# Patient Record
Sex: Female | Born: 1950 | Race: White | Hispanic: No | Marital: Married | Smoking: Never smoker
Health system: Southern US, Community
[De-identification: ages and names within clinical notes are randomized; demographics above are authoritative.]

## PROBLEM LIST (undated history)

## (undated) DIAGNOSIS — H269 Unspecified cataract: Secondary | ICD-10-CM

## (undated) DIAGNOSIS — I712 Thoracic aortic aneurysm, without rupture, unspecified: Secondary | ICD-10-CM

## (undated) DIAGNOSIS — E785 Hyperlipidemia, unspecified: Secondary | ICD-10-CM

## (undated) DIAGNOSIS — T7840XA Allergy, unspecified, initial encounter: Secondary | ICD-10-CM

## (undated) DIAGNOSIS — E039 Hypothyroidism, unspecified: Secondary | ICD-10-CM

## (undated) DIAGNOSIS — F419 Anxiety disorder, unspecified: Secondary | ICD-10-CM

## (undated) DIAGNOSIS — Z9889 Other specified postprocedural states: Secondary | ICD-10-CM

## (undated) DIAGNOSIS — C801 Malignant (primary) neoplasm, unspecified: Secondary | ICD-10-CM

## (undated) DIAGNOSIS — M1990 Unspecified osteoarthritis, unspecified site: Secondary | ICD-10-CM

## (undated) DIAGNOSIS — D709 Neutropenia, unspecified: Secondary | ICD-10-CM

## (undated) DIAGNOSIS — R011 Cardiac murmur, unspecified: Secondary | ICD-10-CM

## (undated) DIAGNOSIS — K5792 Diverticulitis of intestine, part unspecified, without perforation or abscess without bleeding: Secondary | ICD-10-CM

## (undated) HISTORY — PX: TUBAL LIGATION: SHX77

## (undated) HISTORY — PX: KNEE ARTHROSCOPY: SHX127

## (undated) HISTORY — DX: Cardiac murmur, unspecified: R01.1

## (undated) HISTORY — PX: COLONOSCOPY: SHX174

## (undated) HISTORY — DX: Neutropenia, unspecified: D70.9

## (undated) HISTORY — DX: Thoracic aortic aneurysm, without rupture, unspecified: I71.20

## (undated) HISTORY — DX: Unspecified osteoarthritis, unspecified site: M19.90

## (undated) HISTORY — DX: Anxiety disorder, unspecified: F41.9

## (undated) HISTORY — PX: FOOT SURGERY: SHX648

## (undated) HISTORY — PX: LAPAROSCOPIC REPAIR AND REMOVAL OF GASTRIC BAND: SHX5919

## (undated) HISTORY — DX: Hypothyroidism, unspecified: E03.9

## (undated) HISTORY — DX: Diverticulitis of intestine, part unspecified, without perforation or abscess without bleeding: K57.92

## (undated) HISTORY — DX: Hyperlipidemia, unspecified: E78.5

## (undated) HISTORY — DX: Unspecified cataract: H26.9

## (undated) HISTORY — DX: Allergy, unspecified, initial encounter: T78.40XA

---

## 1997-12-08 ENCOUNTER — Other Ambulatory Visit: Admission: RE | Admit: 1997-12-08 | Discharge: 1997-12-08 | Payer: Self-pay | Admitting: Obstetrics and Gynecology

## 1998-03-23 ENCOUNTER — Other Ambulatory Visit: Admission: RE | Admit: 1998-03-23 | Discharge: 1998-03-23 | Payer: Self-pay | Admitting: Obstetrics and Gynecology

## 1998-07-13 ENCOUNTER — Other Ambulatory Visit: Admission: RE | Admit: 1998-07-13 | Discharge: 1998-07-13 | Payer: Self-pay | Admitting: Obstetrics and Gynecology

## 1998-07-19 ENCOUNTER — Ambulatory Visit (HOSPITAL_COMMUNITY): Admission: RE | Admit: 1998-07-19 | Discharge: 1998-07-19 | Payer: Self-pay | Admitting: Obstetrics and Gynecology

## 1998-07-19 ENCOUNTER — Encounter: Payer: Self-pay | Admitting: Obstetrics and Gynecology

## 1998-08-25 ENCOUNTER — Encounter (INDEPENDENT_AMBULATORY_CARE_PROVIDER_SITE_OTHER): Payer: Self-pay | Admitting: Specialist

## 1998-08-25 ENCOUNTER — Ambulatory Visit (HOSPITAL_COMMUNITY): Admission: RE | Admit: 1998-08-25 | Discharge: 1998-08-25 | Payer: Self-pay | Admitting: General Surgery

## 1999-01-10 ENCOUNTER — Other Ambulatory Visit: Admission: RE | Admit: 1999-01-10 | Discharge: 1999-01-10 | Payer: Self-pay | Admitting: *Deleted

## 1999-03-12 HISTORY — PX: UTERINE FIBROID SURGERY: SHX826

## 2000-01-18 ENCOUNTER — Other Ambulatory Visit: Admission: RE | Admit: 2000-01-18 | Discharge: 2000-01-18 | Payer: Self-pay | Admitting: Obstetrics and Gynecology

## 2001-01-22 ENCOUNTER — Other Ambulatory Visit: Admission: RE | Admit: 2001-01-22 | Discharge: 2001-01-22 | Payer: Self-pay | Admitting: Obstetrics and Gynecology

## 2002-03-11 DIAGNOSIS — K5792 Diverticulitis of intestine, part unspecified, without perforation or abscess without bleeding: Secondary | ICD-10-CM

## 2002-03-11 HISTORY — DX: Diverticulitis of intestine, part unspecified, without perforation or abscess without bleeding: K57.92

## 2002-03-11 HISTORY — PX: LAPAROSCOPIC GASTRIC BANDING: SHX1100

## 2002-07-15 ENCOUNTER — Other Ambulatory Visit: Admission: RE | Admit: 2002-07-15 | Discharge: 2002-07-15 | Payer: Self-pay | Admitting: Obstetrics and Gynecology

## 2004-07-10 ENCOUNTER — Emergency Department (HOSPITAL_COMMUNITY): Admission: EM | Admit: 2004-07-10 | Discharge: 2004-07-11 | Payer: Self-pay | Admitting: Emergency Medicine

## 2005-08-21 ENCOUNTER — Encounter (HOSPITAL_COMMUNITY): Admission: RE | Admit: 2005-08-21 | Discharge: 2005-11-19 | Payer: Self-pay | Admitting: Neurology

## 2007-10-12 ENCOUNTER — Ambulatory Visit: Payer: Self-pay | Admitting: Gastroenterology

## 2007-10-23 ENCOUNTER — Ambulatory Visit: Payer: Self-pay | Admitting: Gastroenterology

## 2007-10-23 ENCOUNTER — Encounter: Payer: Self-pay | Admitting: Gastroenterology

## 2007-10-27 ENCOUNTER — Encounter: Payer: Self-pay | Admitting: Gastroenterology

## 2007-12-18 ENCOUNTER — Other Ambulatory Visit: Admission: RE | Admit: 2007-12-18 | Discharge: 2007-12-18 | Payer: Self-pay | Admitting: Obstetrics & Gynecology

## 2010-07-27 NOTE — Op Note (Signed)
[name], Kimberly Bowers                 ACCOUNT NO.:  [id number]   MEDICAL RECORD NO.:  [id number]            PATIENT TYPE:   LOCATION:                                 FACILITY:   PHYSICIAN:  Catherine A. Orlin Hilding, M.D.  DATE OF BIRTH:   DATE OF PROCEDURE:  08/21/2005  DATE OF DISCHARGE:                                 OPERATIVE REPORT   OPERATION/PROCEDURE:  Tensilon testing.   INDICATIONS:  Right eye ptosis, rule out myasthenia gravis.  This is a 60-  year-old white woman with some right eye ptosis, being evaluated for  possible myasthenia with no other symptoms suggestive of that.  She had an  acetylcholine receptor antibody panel that was negative.  See attached H&P  for details.   DESCRIPTION OF PROCEDURE:  A saline lock was inserted into the patient's  right hand. Blood pressure and pulse were monitored periodically throughout  the test.  At baseline the patient had slight lid asymmetry with a few  millimeters of ptosis or pseudo ptosis on the right compared to the left.  A  test dose of 2 mg of Tensilon (0.2 mL) was administered without ill effect.  No change in pulse or blood pressure.  This was flushed with mL's saline.  The remaining 8 mg of were administered in 2-3 mg boluses and followed by  saline flushes.  She was following the entire 10 mg dose.  There was no  change in blood pressure or pulse.  No change in the ptosis versus pseudo  ptosis.  She was monitored an additional 30 minutes with pulse and blood  pressure to insure no bradycardia or hypotension occurred.  She tolerated  the procedure well and will be discharged home.  Outcome is a negative  Tensilon test.      Santina Evans A. Orlin Hilding, M.D.  Electronically Signed     CAW/MEDQ  D:  08/21/2005  T:  08/21/2005  Job:  [id number]   cc:   Allen Norris, M.D.  Fax: [phone]   Lovenia Kim, D.O.  Fax: [phone]

## 2010-12-20 DIAGNOSIS — E66811 Obesity, class 1: Secondary | ICD-10-CM | POA: Insufficient documentation

## 2011-02-21 DIAGNOSIS — I10 Essential (primary) hypertension: Secondary | ICD-10-CM | POA: Insufficient documentation

## 2011-03-23 DIAGNOSIS — Z9884 Bariatric surgery status: Secondary | ICD-10-CM | POA: Insufficient documentation

## 2011-12-09 ENCOUNTER — Other Ambulatory Visit: Payer: Self-pay | Admitting: Dermatology

## 2012-04-29 DIAGNOSIS — K224 Dyskinesia of esophagus: Secondary | ICD-10-CM | POA: Insufficient documentation

## 2012-08-14 ENCOUNTER — Other Ambulatory Visit: Payer: Self-pay | Admitting: Internal Medicine

## 2012-08-14 ENCOUNTER — Ambulatory Visit
Admission: RE | Admit: 2012-08-14 | Discharge: 2012-08-14 | Disposition: A | Discharge status: Home / Self Care | Payer: BC Managed Care – PPO | Source: Ambulatory Visit | Attending: Internal Medicine | Admitting: Internal Medicine

## 2012-08-14 DIAGNOSIS — R109 Unspecified abdominal pain: Secondary | ICD-10-CM

## 2012-08-14 DIAGNOSIS — R197 Diarrhea, unspecified: Secondary | ICD-10-CM

## 2012-08-14 MED ORDER — IOHEXOL 300 MG/ML  SOLN
100.0000 mL | Freq: Once | INTRAMUSCULAR | Status: AC | PRN
  Administered 2012-08-14: 100 mL via INTRAVENOUS

## 2012-10-01 DIAGNOSIS — E119 Type 2 diabetes mellitus without complications: Secondary | ICD-10-CM | POA: Insufficient documentation

## 2012-10-01 DIAGNOSIS — E039 Hypothyroidism, unspecified: Secondary | ICD-10-CM | POA: Insufficient documentation

## 2012-10-13 DIAGNOSIS — K9509 Other complications of gastric band procedure: Secondary | ICD-10-CM | POA: Insufficient documentation

## 2012-12-10 ENCOUNTER — Encounter: Payer: Self-pay | Admitting: Certified Nurse Midwife

## 2013-02-15 ENCOUNTER — Ambulatory Visit: Payer: Self-pay | Admitting: Emergency Medicine

## 2013-03-19 ENCOUNTER — Ambulatory Visit: Payer: Self-pay | Admitting: Certified Nurse Midwife

## 2013-03-21 ENCOUNTER — Encounter: Payer: Self-pay | Admitting: *Deleted

## 2013-03-21 ENCOUNTER — Other Ambulatory Visit: Payer: Self-pay | Admitting: Emergency Medicine

## 2013-03-21 DIAGNOSIS — F419 Anxiety disorder, unspecified: Secondary | ICD-10-CM | POA: Insufficient documentation

## 2013-03-21 DIAGNOSIS — D709 Neutropenia, unspecified: Secondary | ICD-10-CM | POA: Insufficient documentation

## 2013-03-21 DIAGNOSIS — T7840XA Allergy, unspecified, initial encounter: Secondary | ICD-10-CM | POA: Insufficient documentation

## 2013-03-21 DIAGNOSIS — E039 Hypothyroidism, unspecified: Secondary | ICD-10-CM | POA: Insufficient documentation

## 2013-03-21 MED ORDER — MOMETASONE FUROATE 50 MCG/ACT NA SUSP: 2.0000 | Freq: Every day | NASAL | Status: DC

## 2013-03-23 ENCOUNTER — Ambulatory Visit: Payer: Self-pay | Admitting: Certified Nurse Midwife

## 2013-03-25 ENCOUNTER — Ambulatory Visit (INDEPENDENT_AMBULATORY_CARE_PROVIDER_SITE_OTHER): Payer: BC Managed Care – PPO | Admitting: Emergency Medicine

## 2013-03-25 ENCOUNTER — Encounter: Payer: Self-pay | Admitting: Emergency Medicine

## 2013-03-25 VITALS — BP 126/98 | HR 76 | Temp 98.0°F | Resp 16 | Ht 65.5 in | Wt 226.0 lb

## 2013-03-25 DIAGNOSIS — E782 Mixed hyperlipidemia: Secondary | ICD-10-CM | POA: Insufficient documentation

## 2013-03-25 DIAGNOSIS — R635 Abnormal weight gain: Secondary | ICD-10-CM

## 2013-03-25 DIAGNOSIS — I10 Essential (primary) hypertension: Secondary | ICD-10-CM

## 2013-03-25 DIAGNOSIS — E039 Hypothyroidism, unspecified: Secondary | ICD-10-CM

## 2013-03-25 DIAGNOSIS — R7309 Other abnormal glucose: Secondary | ICD-10-CM

## 2013-03-25 LAB — CBC WITH DIFFERENTIAL/PLATELET
Basophils Absolute: 0 10*3/uL (ref 0.0–0.1)
Basophils Relative: 1 % (ref 0–1)
EOS PCT: 4 % (ref 0–5)
Eosinophils Absolute: 0.2 10*3/uL (ref 0.0–0.7)
HCT: 42.1 % (ref 36.0–46.0)
Hemoglobin: 14.4 g/dL (ref 12.0–15.0)
LYMPHS ABS: 1 10*3/uL (ref 0.7–4.0)
Lymphocytes Relative: 24 % (ref 12–46)
MCH: 32.4 pg (ref 26.0–34.0)
MCHC: 34.2 g/dL (ref 30.0–36.0)
MCV: 94.6 fL (ref 78.0–100.0)
MONO ABS: 0.3 10*3/uL (ref 0.1–1.0)
Monocytes Relative: 7 % (ref 3–12)
Neutro Abs: 2.6 10*3/uL (ref 1.7–7.7)
Neutrophils Relative %: 64 % (ref 43–77)
Platelets: 275 10*3/uL (ref 150–400)
RBC: 4.45 MIL/uL (ref 3.87–5.11)
RDW: 13 % (ref 11.5–15.5)
WBC: 4 10*3/uL (ref 4.0–10.5)

## 2013-03-25 LAB — LIPID PANEL
Cholesterol: 224 mg/dL — ABNORMAL HIGH (ref 0–200)
HDL: 82 mg/dL (ref >39)
LDL Cholesterol: 121 mg/dL — ABNORMAL HIGH (ref 0–99)
TRIGLYCERIDES: 103 mg/dL (ref <150)
Total CHOL/HDL Ratio: 2.7 Ratio
VLDL: 21 mg/dL (ref 0–40)

## 2013-03-25 LAB — TSH: TSH: 3.719 u[IU]/mL (ref 0.350–4.500)

## 2013-03-25 LAB — BASIC METABOLIC PANEL WITH GFR
BUN: 16 mg/dL (ref 6–23)
CALCIUM: 9.2 mg/dL (ref 8.4–10.5)
CO2: 29 mEq/L (ref 19–32)
CREATININE: 0.88 mg/dL (ref 0.50–1.10)
Chloride: 101 mEq/L (ref 96–112)
GFR, EST NON AFRICAN AMERICAN: 70 mL/min
GFR, Est African American: 80 mL/min
Glucose, Bld: 87 mg/dL (ref 70–99)
Potassium: 4.3 mEq/L (ref 3.5–5.3)
Sodium: 137 mEq/L (ref 135–145)

## 2013-03-25 LAB — HEPATIC FUNCTION PANEL
ALT: 12 U/L (ref 0–35)
AST: 21 U/L (ref 0–37)
Albumin: 4.2 g/dL (ref 3.5–5.2)
Alkaline Phosphatase: 59 U/L (ref 39–117)
BILIRUBIN DIRECT: 0.1 mg/dL (ref 0.0–0.3)
BILIRUBIN TOTAL: 0.6 mg/dL (ref 0.3–1.2)
Indirect Bilirubin: 0.5 mg/dL (ref 0.0–0.9)
Total Protein: 6.7 g/dL (ref 6.0–8.3)

## 2013-03-25 LAB — HEMOGLOBIN A1C
Hgb A1c MFr Bld: 4.9 % (ref <5.7)
Mean Plasma Glucose: 94 mg/dL (ref <117)

## 2013-03-25 LAB — VITAMIN B12: Vitamin B-12: 537 pg/mL (ref 211–911)

## 2013-03-25 NOTE — Patient Instructions (Signed)
Exercise to Lose Weight Exercise and a healthy diet may help you lose weight. Your doctor may suggest specific exercises. EXERCISE IDEAS AND TIPS  Choose low-cost things you enjoy doing, such as walking, bicycling, or exercising to workout videos.  Take stairs instead of the elevator.  Walk during your lunch break.  Park your car further away from work or school.  Go to a gym or an exercise class.  Start with 5 to 10 minutes of exercise each day. Build up to 30 minutes of exercise 4 to 6 days a week.  Wear shoes with good support and comfortable clothes.  Stretch before and after working out.  Work out until you breathe harder and your heart beats faster.  Drink extra water when you exercise.  Do not do so much that you hurt yourself, feel dizzy, or get very short of breath. Exercises that burn about 150 calories:  Running 1  miles in 15 minutes.  Playing volleyball for 45 to 60 minutes.  Washing and waxing a car for 45 to 60 minutes.  Playing touch football for 45 minutes.  Walking 1  miles in 35 minutes.  Pushing a stroller 1  miles in 30 minutes.  Playing basketball for 30 minutes.  Raking leaves for 30 minutes.  Bicycling 5 miles in 30 minutes.  Walking 2 miles in 30 minutes.  Dancing for 30 minutes.  Shoveling snow for 15 minutes.  Swimming laps for 20 minutes.  Walking up stairs for 15 minutes.  Bicycling 4 miles in 15 minutes.  Gardening for 30 to 45 minutes.  Jumping rope for 15 minutes.  Washing windows or floors for 45 to 60 minutes. Document Released: 03/30/2010 Document Revised: 05/20/2011 Document Reviewed: 03/30/2010 [facility] Patient Information 2014 [place], [place]. Gluten-Free Diet Gluten is a protein found in many grains. Gluten is present in wheat, rye, and barley. Gluten from wheat, rye, and barley protein interferes with the absorption of food in people with gluten sensitivity. It may also cause intestinal injury when eaten by  individuals with gluten sensitivity.  A sample piece (biopsy) of the small intestine is usually required for a positive diagnosis of gluten sensitivity. Dietary treatment consists of eliminating foods and food ingredients from wheat, rye, and barley. When these are taken out of the diet completely, most people regain function of the small intestine. Strict compliance is important even during symptom-free periods. People with gluten sensitivity need to be on a gluten-free diet for a lifetime. During the first stages of treatment, some people will also need to restrict dairy products that contain lactose, which is a naturally occurring sugar. Lactose is difficult to absorb when the small intestines are damaged (lactose intolerance).  WHO NEEDS THIS DIET Some people who have certain diseases need to be on a gluten-free diet. These diseases include:  Celiac disease.  Nontropical sprue.  Gluten-sensitive enteropathy.  Dermatitis herpetiformis. SPECIAL NOTES  Read all labels because gluten may have been added as an incidental ingredient. Words to check for on the label include: flour, starch, durum flour, graham flour, phosphated flour, self-rising flour, semolina, farina, modified food starch, cereal, thickening, fillers, emulsifiers, any kind of malt flavoring, and hydrolyzed vegetable protein. A registered dietician can help you identify possible harmful ingredients in the foods you normally eat.  If you are not sure whether an ingredient contains gluten, check with the manufacturer. Note that some manufacturers may change ingredients without notice. Always read labels.  Since flour and cereal products are often used in the  preparation of foods, it is important to be aware of the methods of preparation used, as well as the foods themselves. This is especially true when you are dining out. Starches  Allowed: Only those prepared from arrowroot, corn, potato, rice, and bean flours. Rice wafers(*),  pure cornmeal tortillas, popcorn, some crackers, and chips(*). Hot cereals made from cornmeal. Ask your dietician which specific hot and cold cereals are allowed. White or sweet potatoes, yams, hominy, rice or wild rice, and special gluten-free pasta. Some oriental rice noodles or bean noodles.  Avoid: All wheat and rye cereals, wheat germ, barley, bran, graham, malt, bulgur, and millet(-). NOTE: Avoid cereals containing malt as a flavoring, such as rice cereal. Regular noodles, spaghetti, macaroni, and most packaged rice mixes(*). All others containing wheat, rye, or barley. Vegetables  Allowed: All plain, fresh, frozen, or canned vegetables.  Avoid: Creamed vegetables(*) and vegetables canned in sauces(*). Any prepared with wheat, rye, or barley. Fruit  Allowed: All fresh, frozen, canned, or dried fruits. Fruit juices.  Avoid: Thickened or prepared fruits and some pie fillings(*). Meat and Meat Substitutes  Allowed: Meat, fish, poultry, or eggs prepared without added wheat, rye, or barley. Luncheon meat(*), frankfurters(*), and pure meat. All aged cheese and processed cheese products(*). Cottage cheese(+) and cream cheese(+). Dried beans, dried peas, and lentils.  Avoid: Any meat or meat alternate containing wheat, rye, barley, or gluten stabilizers. Bread-containing products, such as Swiss steak, croquettes, and meatloaf. Tuna canned in vegetable broth(*); [place] with HVP injected as part of the basting; any cheese product containing oat gum as an ingredient. Milk  Allowed: Milk. Yogurt made with allowed ingredients(*).  Avoid: Commercial chocolate milk which may have cereal added(*). Malted milk. Soups and Combination Foods  Allowed: Homemade broth and soups made with allowed ingredients; some canned or frozen soups are allowed(*). Combination or prepared foods that do not contain gluten(*). Read labels.  Avoid: All soups containing wheat, rye, or barley flour. Bouillon and bouillon  cubes that contain hydrolyzed vegetable protein (HVP). Combination or prepared foods that contain gluten(*). Desserts  Allowed:  Custard, some pudding mixes(*), homemade puddings from cornstarch, rice, and tapioca. Gelatin desserts, ices, and sherbet(*). Cake, cookies, and other desserts prepared with allowed flours. Some commercial ice creams(*). Ask your dietician about specific brands of dessert that are allowed.  Avoid: Cakes, cookies, doughnuts, and pastries that are prepared with wheat, rye, or barley flour. Some commercial ice creams(*), ice cream flavors which contain cookies, crumbs, or cheesecake(*). Ice cream cones. All commercially prepared mixes for cakes, cookies, and other desserts(*). Bread pudding and other puddings thickened with flour. Sweets  Allowed: Sugar, honey, syrup(*), molasses, jelly, jam, plain hard candy, marshmallows, gumdrops, homemade candies free from wheat, rye, or barley. Coconut.  Avoid: Commercial candies containing wheat, rye, or barley(*). Certain buttercrunch toffees are dusted with wheat flour. Ask your dietician about specific brands that are not allowed. Chocolate-coated nuts, which are often rolled in flour. Fats and Oils  Allowed: Butter, margarine, vegetable oil, sour cream(+), whipping cream, shortening, lard, cream, mayonnaise(*). Some commercial salad dressings(*). Peanut butter.  Avoid: Some commercial salad dressings(*). Beverages  Allowed: Coffee (regular or decaffeinated), tea, herbal tea (read label to be sure that no wheat flour has been added). Carbonated beverages and some root beers(*). Wine, sake, and distilled spirits, such as gin, vodka, and whiskey.  Avoid:  Certain cereal beverages. Ask your dietician about specific brands that are not allowed. Beer (unless gluten-free), ale, malted milk, and some root beers, wine,  and sake. Condiments/ Miscellaneous  Allowed: Salt, pepper, herbs, spices, extracts, and food colorings. Monosodium  glutamate (MSG). Cider, rice, and wine vinegar. Baking soda and baking powder. Certain soy sauces. Ask your dietician about specific brands that are allowed. Nuts, coconut, chocolate, and pure cocoa powder.  Avoid: Some curry powder(*), some dry seasoning mixes(*), some gravy extracts(*), some meat sauces(*), some catsup(*), some prepared mustard(*), horseradish(*), some soy sauce(*), chip dips(*), and some chewing gum(*). Yeast extract (contains barley). Caramel color (may contain malt). Ask your dietician about specific brands of condiments to avoid. Flour and Thickening Agents  Allowed: Arrowroot starch (A); Corn bran (B); Corn flour (B,C,D); Corn germ (B); Cornmeal (B,C,D); Corn starch (A); Potato flour (B,C,E); Potato starch flour (B,C,E); Rice bran (B); Rice flours: Plain, brown (B,C,D,E), and Sweet (A,B,C,F). Rice polish (B,C,G); Soy flour (B,C,G); Tapioca starch (A). The flour and thickening agents described above are good for: (A) Good thickening agent (B) Good when combined with other flours (C) Best combined with milk and eggs in baked products (D) Best in grainy-textured products (E) Produces drier product than other flours (F) Produces moister product than other flours (G) Adds distinct flavor to product. Use in moderation. (*) Check labels and investigate any questionable ingredients.  (-) Additional research is needed before this product can be recommended. (+) Check vegetable gum used. SAMPLE MEAL PLAN Breakfast   Orange juice.  Banana.  Rice or corn cereal.  Toast (gluten-free bread).  Heart-healthy tub margarine.  Jam.  Milk.  Coffee or tea. Lunch  Chicken salad sandwich (with gluten-free bread and mayonnaise).  Sliced tomatoes.  Heart-healthy tub margarine.  Apple.  Milk.  Coffee or tea. Dinner  [employer].  Baked potato.  Broccoli.  Lettuce salad with gluten-free dressing.  Gluten-free bread.  Custard.  Heart-healthy  margarine.  Coffee or tea. These meal plans are provided as samples. Your daily meal plans will vary. Document Released: 02/25/2005 Document Revised: 08/27/2011 Document Reviewed: 04/07/2011 [facility] Patient Information 2014 [place].

## 2013-03-25 NOTE — Progress Notes (Signed)
Subjective:    Patient ID: Kimberly Bowers, female    DOB: 1951/01/26, 63 y.o.   MRN: [id number]  HPI Comments: 63 yo female presents for 3 month F/U for labile HTN, Cholesterol, Pre-Dm, D. deficient She recently had lap band removed because of complications. She notes she is gaining weight. She is not exercising routinely. She is concerned that TSH is out of range. LAST LABS T 196 TG 71 H 74 L 108 INSULIN 14 D 61 A1C 5.1 She is trying to improve her diet. She is not checking BP routinely.   Hyperlipidemia   Current Outpatient Prescriptions on File Prior to Visit  Medication Sig Dispense Refill  . Calcium Carbonate-Vit D-Min (CALCIUM 600 + MINERALS PO) Take by mouth daily.      . Cholecalciferol (VITAMIN D) 2000 UNITS tablet Take 2,000 Units by mouth daily.      . fexofenadine (ALLEGRA) 60 MG tablet Take 60 mg by mouth daily as needed for allergies or rhinitis.      Marland Kitchen levothyroxine (SYNTHROID, LEVOTHROID) 100 MCG tablet Take 100 mcg by mouth daily before breakfast.      . metFORMIN (GLUCOPHAGE) 500 MG tablet Take by mouth 2 (two) times daily with a meal.      . mometasone (NASONEX) 50 MCG/ACT nasal spray Place 2 sprays into the nose daily.  17 g  2  . Wheat Dextrin (BENEFIBER) POWD Take by mouth 2 (two) times daily.      Marland Kitchen ALPRAZolam (XANAX) 0.25 MG tablet Take 0.25 mg by mouth as needed for anxiety.       No current facility-administered medications on file prior to visit.    Review of patient's allergies indicates no known allergies.  Past Medical History  Diagnosis Date  . Anxiety   . Hypothyroid   . Neutropenia   . Allergy       Review of Systems  Constitutional: Positive for unexpected weight change.  All other systems reviewed and are negative.   BP 126/98  Pulse 76  Temp(Src) 98 F (36.7 C) (Temporal)  Resp 16  Ht 5' 5.5" (1.664 m)  Wt 226 lb (102.513 kg)  BMI 37.02 kg/m2      Objective:   Physical Exam  Nursing note and vitals reviewed. Constitutional: She  is oriented to person, place, and time. She appears well-developed and well-nourished. No distress.  obese  HENT:  Head: Normocephalic and atraumatic.  Right Ear: External ear normal.  Left Ear: External ear normal.  Nose: Nose normal.  Mouth/Throat: Oropharynx is clear and moist.  Eyes: Conjunctivae and EOM are normal.  Neck: Normal range of motion. Neck supple. No JVD present. No thyromegaly present.  Cardiovascular: Normal rate, regular rhythm, normal heart sounds and intact distal pulses.   Pulmonary/Chest: Effort normal and breath sounds normal.  Abdominal: Soft. Bowel sounds are normal. She exhibits no distension and no mass. There is no tenderness. There is no rebound and no guarding.  Musculoskeletal: Normal range of motion. She exhibits no edema and no tenderness.  Lymphadenopathy:    She has no cervical adenopathy.  Neurological: She is alert and oriented to person, place, and time. No cranial nerve deficit.  Skin: Skin is warm and dry. No rash noted. No erythema. No pallor.  Psychiatric: She has a normal mood and affect. Her behavior is normal. Judgment and thought content normal.          Assessment & Plan:  1.  3 month F/U for labile HTN, diet/ herbal  controlled Cholesterol, Pre-Dm, D. Deficient. Needs healthy diet, cardio QD and obtain healthy weight. Check Labs, Check BP if >130/80 call office 2. Hypothyroid/ WT gain/ obese- Check labs, needs cardio QD and better diet discussed

## 2013-03-26 LAB — INSULIN, FASTING: Insulin fasting, serum: 11 u[IU]/mL (ref 3–28)

## 2013-04-08 ENCOUNTER — Encounter: Payer: Self-pay | Admitting: Certified Nurse Midwife

## 2013-04-14 ENCOUNTER — Ambulatory Visit: Payer: Self-pay | Admitting: Certified Nurse Midwife

## 2013-06-10 ENCOUNTER — Other Ambulatory Visit: Payer: Self-pay | Admitting: Emergency Medicine

## 2013-06-10 ENCOUNTER — Telehealth: Payer: Self-pay | Admitting: *Deleted

## 2013-06-10 MED ORDER — LEVOTHYROXINE SODIUM 100 MCG PO TABS: ORAL_TABLET | ORAL | Status: DC

## 2013-06-10 NOTE — Telephone Encounter (Signed)
Synthroid 126mcg takes 1 qd except on M W F takes 1 & 1/2   [facility]  [place]

## 2013-07-22 ENCOUNTER — Ambulatory Visit (INDEPENDENT_AMBULATORY_CARE_PROVIDER_SITE_OTHER): Payer: BC Managed Care – PPO | Admitting: Certified Nurse Midwife

## 2013-07-22 ENCOUNTER — Encounter: Payer: Self-pay | Admitting: Certified Nurse Midwife

## 2013-07-22 VITALS — BP 104/68 | HR 72 | Resp 16 | Ht 65.75 in | Wt 235.0 lb

## 2013-07-22 DIAGNOSIS — Z01419 Encounter for gynecological examination (general) (routine) without abnormal findings: Secondary | ICD-10-CM

## 2013-07-22 DIAGNOSIS — Z124 Encounter for screening for malignant neoplasm of cervix: Secondary | ICD-10-CM

## 2013-07-22 DIAGNOSIS — Z0000 Encounter for general adult medical examination without abnormal findings: Secondary | ICD-10-CM

## 2013-07-22 NOTE — Patient Instructions (Signed)

## 2013-07-22 NOTE — Progress Notes (Signed)
63 y.o. G2P2002 Married Caucasian Fe here for annual exam. Menopausal no HRT. Denies vaginal bleeding or vaginal dryness. Sees PCP for thyroid management, change in medication recently, has follow up scheduled. Sees PCP for aex and labs. Patient had lap removal due difficulty swallowing and has gained 30 pounds back. Has joined Weight watchers now with spouse and "is going to get this weight off again". No other health issues today.  Patient's last menstrual period was 03/11/2000.          Sexually active: yes  The current method of family planning is tubal ligation.    Exercising: yes  walking & free weights Smoker:  no  Health Maintenance: Pap:  02-26-12 neg HPV HR neg MMG:  04-22-13 normal Colonoscopy:  2009 10 year follow up BMD:   2015 TDaP:  07/08/12 Labs: none Self breast exam: done monthly   reports that she has never smoked. She does not have any smokeless tobacco history on file. She reports that she drinks about 1.2 - 1.8 ounces of alcohol per week. She reports that she does not use illicit drugs.  Past Medical History  Diagnosis Date  . Anxiety   . Hypothyroid   . Neutropenia   . Allergy   . Hypertension   . Diverticulitis 2004    Past Surgical History  Procedure Laterality Date  . Laparoscopic gastric banding  2004  . Knee arthroscopy Left   . Uterine fibroid surgery  2001  . Foot surgery Right     benign cyst  . Tubal ligation      age 4  . Laparoscopic repair and removal of gastric band      8/14    Current Outpatient Prescriptions  Medication Sig Dispense Refill  . Calcium Carbonate-Vit D-Min (CALCIUM 600 + MINERALS PO) Take by mouth daily.      . Cholecalciferol (VITAMIN D) 2000 UNITS tablet Take 2,000 Units by mouth daily.      . fexofenadine (ALLEGRA) 60 MG tablet Take 60 mg by mouth daily as needed for allergies or rhinitis.      Marland Kitchen levothyroxine (SYNTHROID, LEVOTHROID) 100 MCG tablet Take 1.5 tabs MWF = 150 mcg and 1 tab = 100 mcg rest of week  110  tablet  1  . mometasone (NASONEX) 50 MCG/ACT nasal spray Place 2 sprays into the nose daily.  17 g  2  . Wheat Dextrin (BENEFIBER) POWD Take by mouth 2 (two) times daily.       No current facility-administered medications for this visit.    Family History  Problem Relation Age of Onset  . Cancer Mother     lung  . Diabetes Father     ROS:  Pertinent items are noted in HPI.  Otherwise, a comprehensive ROS was negative.  Exam:   BP 104/68  Pulse 72  Resp 16  Ht 5' 5.75" (1.67 m)  Wt 235 lb (106.595 kg)  BMI 38.22 kg/m2  LMP 03/11/2000 Height: 5' 5.75" (167 cm)  Ht Readings from Last 3 Encounters:  07/22/13 5' 5.75" (1.67 m)  03/25/13 5' 5.5" (1.664 m)    General appearance: alert, cooperative and appears stated age Head: Normocephalic, without obvious abnormality, atraumatic Neck: no adenopathy, supple, symmetrical, trachea midline and thyroid normal to inspection and palpation Lungs: clear to auscultation bilaterally Breasts: normal appearance, no masses or tenderness, No nipple retraction or dimpling, No nipple discharge or bleeding, No axillary or supraclavicular adenopathy Heart: regular rate and rhythm Abdomen: soft, non-tender; no masses,  no organomegaly Extremities: extremities normal, atraumatic, no cyanosis or edema Skin: Skin color, texture, turgor normal. No rashes or lesions Lymph nodes: Cervical, supraclavicular, and axillary nodes normal. No abnormal inguinal nodes palpated Neurologic: Grossly normal   Pelvic: External genitalia:  no lesions              Urethra:  normal appearing urethra with no masses, tenderness or lesions              Bartholin's and Skene's: normal                 Vagina: normal appearing vagina with normal color and discharge, no lesions              Cervix: normal, non tender              Pap taken: yes Bimanual Exam:  Uterus:  normal size, contour, position, consistency, mobility, non-tender and mid position              Adnexa:  normal adnexa and no mass, fullness, tenderness               Rectovaginal: Confirms               Anus:  normal sphincter tone, no lesions  A:  Well Woman with normal exam  Menopausal no HRT  Hypothyroid with PCP management  Weight gain with Lap band removal working on weight loss now  P:   Reviewed health and wellness pertinent to exam  Aware of need to evaluate if vaginal bleeding  Continue follow up as indicated  Pap smear taken today with HPV reflex   counseled on breast self exam, mammography screening, adequate intake of calcium and vitamin D, diet and exercise  return annually or prn  An After Visit Summary was printed and given to the patient.

## 2013-07-26 LAB — IPS PAP TEST WITH HPV

## 2013-07-26 NOTE — Progress Notes (Signed)
Reviewed personally.  M. Suzanne Rasool Rommel, MD.  

## 2013-08-05 ENCOUNTER — Ambulatory Visit (INDEPENDENT_AMBULATORY_CARE_PROVIDER_SITE_OTHER): Payer: BC Managed Care – PPO | Admitting: Emergency Medicine

## 2013-08-05 ENCOUNTER — Encounter: Payer: Self-pay | Admitting: Emergency Medicine

## 2013-08-05 VITALS — BP 116/70 | HR 92 | Temp 98.0°F | Resp 16 | Ht 65.5 in | Wt 231.0 lb

## 2013-08-05 DIAGNOSIS — E782 Mixed hyperlipidemia: Secondary | ICD-10-CM

## 2013-08-05 DIAGNOSIS — R7309 Other abnormal glucose: Secondary | ICD-10-CM

## 2013-08-05 DIAGNOSIS — E039 Hypothyroidism, unspecified: Secondary | ICD-10-CM

## 2013-08-05 DIAGNOSIS — M25562 Pain in left knee: Secondary | ICD-10-CM

## 2013-08-05 DIAGNOSIS — M25569 Pain in unspecified knee: Secondary | ICD-10-CM

## 2013-08-05 DIAGNOSIS — R6889 Other general symptoms and signs: Secondary | ICD-10-CM

## 2013-08-05 DIAGNOSIS — E669 Obesity, unspecified: Secondary | ICD-10-CM

## 2013-08-05 LAB — CBC WITH DIFFERENTIAL/PLATELET
BASOS PCT: 1 % (ref 0–1)
Basophils Absolute: 0 10*3/uL (ref 0.0–0.1)
EOS ABS: 0.1 10*3/uL (ref 0.0–0.7)
Eosinophils Relative: 2 % (ref 0–5)
HCT: 42 % (ref 36.0–46.0)
HEMOGLOBIN: 14.4 g/dL (ref 12.0–15.0)
Lymphocytes Relative: 25 % (ref 12–46)
Lymphs Abs: 0.9 10*3/uL (ref 0.7–4.0)
MCH: 31.5 pg (ref 26.0–34.0)
MCHC: 34.3 g/dL (ref 30.0–36.0)
MCV: 91.9 fL (ref 78.0–100.0)
MONOS PCT: 6 % (ref 3–12)
Monocytes Absolute: 0.2 10*3/uL (ref 0.1–1.0)
Neutro Abs: 2.4 10*3/uL (ref 1.7–7.7)
Neutrophils Relative %: 66 % (ref 43–77)
PLATELETS: 300 10*3/uL (ref 150–400)
RBC: 4.57 MIL/uL (ref 3.87–5.11)
RDW: 13.8 % (ref 11.5–15.5)
WBC: 3.6 10*3/uL — ABNORMAL LOW (ref 4.0–10.5)

## 2013-08-05 LAB — HEMOGLOBIN A1C
HEMOGLOBIN A1C: 5.2 % (ref <5.7)
Mean Plasma Glucose: 103 mg/dL (ref <117)

## 2013-08-05 NOTE — Patient Instructions (Addendum)
Obesity Obesity is having too much body fat and a body mass index (BMI) of 30 or more. BMI is a number based on your height and weight. The number is an estimate of how much body fat you have. Obesity can happen if you eat more calories than you can burn by exercising or other activity. It can cause major health problems or emergencies.  HOME CARE  Exercise and be active as told by your doctor. Try:  Using stairs when you can.  Parking farther away from store doors.  Gardening, biking, or walking.  Eat healthy foods and drinks that are low in calories. Eat more fruits and vegetables.  Limit fast food, sweets, and snack foods that are made with ingredients that are not natural (processed food).  Eat smaller amounts of food.  Keep a journal and write down what you eat every day. Websites can help with this.  Avoid drinking alcohol. Drink more water and drinks without calories.   Take vitamins and dietary pills (supplements) only as told by your doctor.  Try going to weight-loss support groups or classes to help lessen stress. Dieticians and counselors may also help. GET HELP RIGHT AWAY IF:  You have chest pain or tightness.  You have trouble breathing or feel short of breath.  You feel weak or have loss of feeling (numbness) in your legs.  You feel confused or have trouble talking.  You have sudden changes in your vision. MAKE SURE YOU:  Understand these instructions.  Will watch your condition.  Will get help right away if you are not doing well or get worse. Document Released: 05/20/2011 Document Reviewed: 05/20/2011 [facility] Patient Information 2014 [place]. Knee Pain Knee pain can be a result of an injury or other medical conditions. Treatment will depend on the cause of your pain. HOME CARE  Only take medicine as told by your doctor.  Keep a healthy weight. Being overweight can make the knee hurt more.  Stretch before exercising or playing sports.  If  there is constant knee pain, change the way you exercise. Ask your doctor for advice.  Make sure shoes fit well. Choose the right shoe for the sport or activity.  Protect your knees. Wear kneepads if needed.  Rest when you are tired. GET HELP RIGHT AWAY IF:   Your knee pain does not stop.  Your knee pain does not get better.  Your knee joint feels hot to the touch.  You have a fever. MAKE SURE YOU:   Understand these instructions.  Will watch this condition.  Will get help right away if you are not doing well or get worse. Document Released: 05/24/2008 Document Revised: 05/20/2011 Document Reviewed: 05/24/2008 [facility] Patient Information 2014 [place], [place].

## 2013-08-05 NOTE — Progress Notes (Signed)
Subjective:    Patient ID: Kimberly Bowers, female    DOB: [birth year], 63 y.o.   MRN: [id number]  HPI Comments: 63 yo WF presents for 3 month F/U for obesity, hypothyroid, Cholesterol, Pre-Dm, D. deficient She has been under increased stress and has not been eating as healthy. She is concerned about weight gain and is going to start weight watchers. She walks daily with dog. She notes hard to exercise with knee pain. She denies new injury. Left knee past history with scope and has mild increase in pain and fullness. + difficulty with climbing steps  WBC             4.0   03/25/2013 HGB            14.4   03/25/2013 HCT            42.1   03/25/2013 PLT             275   03/25/2013 GLUCOSE          87   03/25/2013 CHOL            224   03/25/2013 TRIG            103   03/25/2013 HDL              82   03/25/2013 LDLCALC         121   03/25/2013 ALT              12   03/25/2013 AST              21   03/25/2013 NA              137   03/25/2013 K               4.3   03/25/2013 CL              101   03/25/2013 CREATININE     0.88   03/25/2013 BUN              16   03/25/2013 CO2              29   03/25/2013 TSH           3.719   03/25/2013 HGBA1C          4.9   03/25/2013      Medication List       This list is accurate as of: 08/05/13 10:21 AM.  Always use your most recent med list.               BENEFIBER Powd  Take by mouth 2 (two) times daily.     CALCIUM 600 + MINERALS PO  Take by mouth daily.     fexofenadine 60 MG tablet  Commonly known as:  ALLEGRA  Take 60 mg by mouth daily as needed for allergies or rhinitis.     Flax Seed Oil 1000 MG Caps  Take 1,000 mg by mouth daily.     levothyroxine 100 MCG tablet  Commonly known as:  SYNTHROID, LEVOTHROID  Take 1.5 tabs MWF = 150 mcg and 1 tab = 100 mcg rest of week     mometasone 50 MCG/ACT nasal spray  Commonly known as:  NASONEX  Place 2 sprays into the nose daily.     Vitamin D 2000 UNITS tablet  Take 2,000 Units by mouth daily.       No  Known Allergies  Past Medical History  Diagnosis Date  . Anxiety   . Hypothyroid   . Neutropenia   . Allergy   . Hypertension   . Diverticulitis 2004      Review of Systems  Musculoskeletal: Positive for arthralgias.  All other systems reviewed and are negative.  BP 116/70  Pulse 92  Temp(Src) 98 F (36.7 C) (Temporal)  Resp 16  Ht 5' 5.5" (1.664 m)  Wt 231 lb (104.781 kg)  BMI 37.84 kg/m2  LMP 03/11/2000     Objective:   Physical Exam  Nursing note and vitals reviewed. Constitutional: She is oriented to person, place, and time. She appears well-developed and well-nourished. No distress.  HENT:  Head: Normocephalic and atraumatic.  Right Ear: External ear normal.  Left Ear: External ear normal.  Nose: Nose normal.  Mouth/Throat: Oropharynx is clear and moist.  Eyes: Conjunctivae and EOM are normal.  Neck: Normal range of motion. Neck supple. No JVD present. No thyromegaly present.  Cardiovascular: Normal rate, regular rhythm, normal heart sounds and intact distal pulses.   Pulmonary/Chest: Effort normal and breath sounds normal.  Abdominal: Soft. Bowel sounds are normal. She exhibits no distension and no mass. There is no tenderness. There is no rebound and no guarding.  Musculoskeletal: Normal range of motion. She exhibits tenderness. She exhibits no edema.  Left knee with ROM, + crepitus  Lymphadenopathy:    She has no cervical adenopathy.  Neurological: She is alert and oriented to person, place, and time. No cranial nerve deficit.  Skin: Skin is warm and dry. No rash noted. No erythema. No pallor.  Psychiatric: She has a normal mood and affect. Her behavior is normal. Judgment and thought content normal.          Assessment & Plan:  1.  3 month F/U for Obesity, Cholesterol, Pre-Dm, D. Deficient. Needs healthy diet, cardio QD and obtain healthy weight. Check Labs, Check BP if >130/80 call office   2.  Hypothyroid- Recheck labs with recent dose  change  3. Left knee pain- check xray, water exercises

## 2013-08-06 LAB — BASIC METABOLIC PANEL WITH GFR
BUN: 16 mg/dL (ref 6–23)
CALCIUM: 9.6 mg/dL (ref 8.4–10.5)
CO2: 26 mEq/L (ref 19–32)
Chloride: 102 mEq/L (ref 96–112)
Creat: 0.88 mg/dL (ref 0.50–1.10)
GFR, EST NON AFRICAN AMERICAN: 70 mL/min
GFR, Est African American: 80 mL/min
Glucose, Bld: 90 mg/dL (ref 70–99)
Potassium: 4.6 mEq/L (ref 3.5–5.3)
SODIUM: 139 meq/L (ref 135–145)

## 2013-08-06 LAB — HEPATIC FUNCTION PANEL
ALK PHOS: 52 U/L (ref 39–117)
ALT: 12 U/L (ref 0–35)
AST: 18 U/L (ref 0–37)
Albumin: 4.2 g/dL (ref 3.5–5.2)
BILIRUBIN INDIRECT: 0.5 mg/dL (ref 0.2–1.2)
BILIRUBIN TOTAL: 0.6 mg/dL (ref 0.2–1.2)
Bilirubin, Direct: 0.1 mg/dL (ref 0.0–0.3)
Total Protein: 6.7 g/dL (ref 6.0–8.3)

## 2013-08-06 LAB — TSH: TSH: 0.457 u[IU]/mL (ref 0.350–4.500)

## 2013-08-06 LAB — LIPID PANEL
CHOL/HDL RATIO: 2.8 ratio
Cholesterol: 204 mg/dL — ABNORMAL HIGH (ref 0–200)
HDL: 73 mg/dL (ref >39)
LDL CALC: 109 mg/dL — AB (ref 0–99)
Triglycerides: 111 mg/dL (ref <150)
VLDL: 22 mg/dL (ref 0–40)

## 2013-08-06 LAB — URIC ACID: URIC ACID, SERUM: 5.5 mg/dL (ref 2.4–7.0)

## 2013-08-06 LAB — INSULIN, FASTING: INSULIN FASTING, SERUM: 11 u[IU]/mL (ref 3–28)

## 2013-08-19 DIAGNOSIS — M19079 Primary osteoarthritis, unspecified ankle and foot: Secondary | ICD-10-CM | POA: Diagnosis not present

## 2013-08-27 DIAGNOSIS — R05 Cough: Secondary | ICD-10-CM | POA: Diagnosis not present

## 2013-08-27 DIAGNOSIS — J069 Acute upper respiratory infection, unspecified: Secondary | ICD-10-CM | POA: Diagnosis not present

## 2013-08-27 DIAGNOSIS — R059 Cough, unspecified: Secondary | ICD-10-CM | POA: Diagnosis not present

## 2013-08-27 DIAGNOSIS — J029 Acute pharyngitis, unspecified: Secondary | ICD-10-CM | POA: Diagnosis not present

## 2013-09-21 ENCOUNTER — Ambulatory Visit: Payer: Self-pay | Admitting: Emergency Medicine

## 2013-09-24 ENCOUNTER — Ambulatory Visit (INDEPENDENT_AMBULATORY_CARE_PROVIDER_SITE_OTHER): Payer: Medicare Other | Admitting: Emergency Medicine

## 2013-09-24 ENCOUNTER — Encounter: Payer: Self-pay | Admitting: Emergency Medicine

## 2013-09-24 VITALS — BP 124/70 | HR 86 | Temp 98.0°F | Resp 18 | Ht 65.5 in | Wt 228.0 lb

## 2013-09-24 DIAGNOSIS — R6889 Other general symptoms and signs: Secondary | ICD-10-CM | POA: Diagnosis not present

## 2013-09-24 DIAGNOSIS — E782 Mixed hyperlipidemia: Secondary | ICD-10-CM | POA: Diagnosis not present

## 2013-09-24 LAB — LIPID PANEL
CHOL/HDL RATIO: 2.7 ratio
CHOLESTEROL: 198 mg/dL (ref 0–200)
HDL: 74 mg/dL (ref >39)
LDL Cholesterol: 110 mg/dL — ABNORMAL HIGH (ref 0–99)
TRIGLYCERIDES: 69 mg/dL (ref <150)
VLDL: 14 mg/dL (ref 0–40)

## 2013-09-24 LAB — BASIC METABOLIC PANEL WITH GFR
BUN: 18 mg/dL (ref 6–23)
CO2: 26 meq/L (ref 19–32)
Calcium: 9.4 mg/dL (ref 8.4–10.5)
Chloride: 100 mEq/L (ref 96–112)
Creat: 0.87 mg/dL (ref 0.50–1.10)
GFR, EST AFRICAN AMERICAN: 81 mL/min
GFR, Est Non African American: 70 mL/min
GLUCOSE: 81 mg/dL (ref 70–99)
POTASSIUM: 4.3 meq/L (ref 3.5–5.3)
SODIUM: 136 meq/L (ref 135–145)

## 2013-09-24 LAB — CBC WITH DIFFERENTIAL/PLATELET
Basophils Absolute: 0 10*3/uL (ref 0.0–0.1)
Basophils Relative: 1 % (ref 0–1)
Eosinophils Absolute: 0.1 10*3/uL (ref 0.0–0.7)
Eosinophils Relative: 3 % (ref 0–5)
HCT: 42.8 % (ref 36.0–46.0)
HEMOGLOBIN: 15.1 g/dL — AB (ref 12.0–15.0)
LYMPHS ABS: 1 10*3/uL (ref 0.7–4.0)
LYMPHS PCT: 24 % (ref 12–46)
MCH: 32.6 pg (ref 26.0–34.0)
MCHC: 35.3 g/dL (ref 30.0–36.0)
MCV: 92.4 fL (ref 78.0–100.0)
Monocytes Absolute: 0.3 10*3/uL (ref 0.1–1.0)
Monocytes Relative: 8 % (ref 3–12)
NEUTROS PCT: 64 % (ref 43–77)
Neutro Abs: 2.7 10*3/uL (ref 1.7–7.7)
Platelets: 275 10*3/uL (ref 150–400)
RBC: 4.63 MIL/uL (ref 3.87–5.11)
RDW: 13.9 % (ref 11.5–15.5)
WBC: 4.2 10*3/uL (ref 4.0–10.5)

## 2013-09-24 NOTE — Progress Notes (Signed)
Subjective:    Patient ID: Kimberly Bowers, female    DOB: 08-Dec-1950, 63 y.o.   MRN: [id number]  HPI Comments: 63 yo WF with close f/u for cholesterol. She wants to try to stay off RX for cholesterol. She is down 8#. She is exercising more and eating healthier. She has started on Diclofenac for arthritis and that has helped.   WBC             3.6   08/05/2013 HGB            14.4   08/05/2013 HCT            42.0   08/05/2013 PLT             300   08/05/2013 GLUCOSE          90   08/05/2013 CHOL            204   08/05/2013 TRIG            111   08/05/2013 HDL              73   08/05/2013 LDLCALC         109   08/05/2013 ALT              12   08/05/2013 AST              18   08/05/2013 NA              139   08/05/2013 K               4.6   08/05/2013 CL              102   08/05/2013 CREATININE     0.88   08/05/2013 BUN              16   08/05/2013 CO2              26   08/05/2013 TSH           0.457   08/05/2013 HGBA1C          5.2   08/05/2013      Medication List       This list is accurate as of: 09/24/13 11:18 AM.  Always use your most recent med list.               BENEFIBER Powd  Take by mouth 2 (two) times daily.     CALCIUM 600 + MINERALS PO  Take by mouth daily.     diclofenac 75 MG EC tablet  Commonly known as:  VOLTAREN  Take 75 mg by mouth 2 (two) times daily.     fexofenadine 60 MG tablet  Commonly known as:  ALLEGRA  Take 60 mg by mouth daily as needed for allergies or rhinitis.     Flax Seed Oil 1000 MG Caps  Take 1,000 mg by mouth daily.     levothyroxine 100 MCG tablet  Commonly known as:  SYNTHROID, LEVOTHROID  Take 1.5 tabs MWF = 150 mcg and 1 tab = 100 mcg rest of week     mometasone 50 MCG/ACT nasal spray  Commonly known as:  NASONEX  Place 2 sprays into the nose daily.     Vitamin D 2000 UNITS tablet  Take 2,000 Units by mouth daily.       No Known Allergies Past Medical History  Diagnosis Date  . Anxiety   .  Hypothyroid   . Neutropenia   .  Allergy   . Hypertension   . Diverticulitis 2004     Review of Systems  All other systems reviewed and are negative.  BP 124/70  Pulse 86  Temp(Src) 98 F (36.7 C) (Temporal)  Resp 18  Ht 5' 5.5" (1.664 m)  Wt 228 lb (103.42 kg)  BMI 37.35 kg/m2  LMP 03/11/2000     Objective:   Physical Exam  Nursing note and vitals reviewed. Constitutional: She is oriented to person, place, and time. She appears well-developed and well-nourished. No distress.  obese  HENT:  Head: Normocephalic and atraumatic.  Right Ear: External ear normal.  Left Ear: External ear normal.  Nose: Nose normal.  Mouth/Throat: Oropharynx is clear and moist.  Eyes: Conjunctivae and EOM are normal.  Neck: Normal range of motion. Neck supple. No thyromegaly present.  Cardiovascular: Normal rate, regular rhythm, normal heart sounds and intact distal pulses.   Pulmonary/Chest: Effort normal and breath sounds normal.  Abdominal: Soft. Bowel sounds are normal. She exhibits no distension. There is no tenderness. There is no guarding.  Musculoskeletal: Normal range of motion. She exhibits no edema and no tenderness.  Lymphadenopathy:    She has no cervical adenopathy.  Neurological: She is alert and oriented to person, place, and time. No cranial nerve deficit.  Skin: Skin is warm and dry. No rash noted. No erythema. No pallor.  Psychiatric: She has a normal mood and affect. Her behavior is normal. Judgment and thought content normal.          Assessment & Plan:  1. Cholesterol- recheck labs, Need to eat healthier and exercise AD.  2. Abnormal labs- recheck

## 2013-09-27 ENCOUNTER — Encounter: Payer: Self-pay | Admitting: *Deleted

## 2013-10-14 ENCOUNTER — Encounter: Payer: Self-pay | Admitting: Gastroenterology

## 2013-10-24 ENCOUNTER — Other Ambulatory Visit: Payer: Self-pay | Admitting: Internal Medicine

## 2013-10-29 DIAGNOSIS — H25019 Cortical age-related cataract, unspecified eye: Secondary | ICD-10-CM | POA: Diagnosis not present

## 2013-10-29 DIAGNOSIS — H02409 Unspecified ptosis of unspecified eyelid: Secondary | ICD-10-CM | POA: Diagnosis not present

## 2013-10-29 DIAGNOSIS — H2510 Age-related nuclear cataract, unspecified eye: Secondary | ICD-10-CM | POA: Diagnosis not present

## 2013-12-08 ENCOUNTER — Other Ambulatory Visit: Payer: Self-pay | Admitting: *Deleted

## 2013-12-08 DIAGNOSIS — Z23 Encounter for immunization: Secondary | ICD-10-CM | POA: Diagnosis not present

## 2013-12-08 MED ORDER — LEVOTHYROXINE SODIUM 100 MCG PO TABS: ORAL_TABLET | ORAL | Status: DC

## 2013-12-13 DIAGNOSIS — M5136 Other intervertebral disc degeneration, lumbar region: Secondary | ICD-10-CM | POA: Diagnosis not present

## 2013-12-13 DIAGNOSIS — M25561 Pain in right knee: Secondary | ICD-10-CM | POA: Diagnosis not present

## 2013-12-25 DIAGNOSIS — M47816 Spondylosis without myelopathy or radiculopathy, lumbar region: Secondary | ICD-10-CM | POA: Diagnosis not present

## 2013-12-30 DIAGNOSIS — M238X1 Other internal derangements of right knee: Secondary | ICD-10-CM | POA: Diagnosis not present

## 2014-01-06 DIAGNOSIS — M47816 Spondylosis without myelopathy or radiculopathy, lumbar region: Secondary | ICD-10-CM | POA: Diagnosis not present

## 2014-01-06 DIAGNOSIS — M5136 Other intervertebral disc degeneration, lumbar region: Secondary | ICD-10-CM | POA: Diagnosis not present

## 2014-01-06 DIAGNOSIS — M25561 Pain in right knee: Secondary | ICD-10-CM | POA: Diagnosis not present

## 2014-01-06 DIAGNOSIS — M238X1 Other internal derangements of right knee: Secondary | ICD-10-CM | POA: Diagnosis not present

## 2014-01-10 ENCOUNTER — Encounter: Payer: Self-pay | Admitting: Emergency Medicine

## 2014-01-18 DIAGNOSIS — M25551 Pain in right hip: Secondary | ICD-10-CM | POA: Diagnosis not present

## 2014-01-18 DIAGNOSIS — M47816 Spondylosis without myelopathy or radiculopathy, lumbar region: Secondary | ICD-10-CM | POA: Diagnosis not present

## 2014-01-18 DIAGNOSIS — M25561 Pain in right knee: Secondary | ICD-10-CM | POA: Diagnosis not present

## 2014-01-19 DIAGNOSIS — M25551 Pain in right hip: Secondary | ICD-10-CM | POA: Diagnosis not present

## 2014-01-19 DIAGNOSIS — M47816 Spondylosis without myelopathy or radiculopathy, lumbar region: Secondary | ICD-10-CM | POA: Diagnosis not present

## 2014-01-29 ENCOUNTER — Encounter: Payer: Self-pay | Admitting: *Deleted

## 2014-02-01 ENCOUNTER — Ambulatory Visit: Payer: Self-pay | Admitting: Physician Assistant

## 2014-02-07 DIAGNOSIS — M25551 Pain in right hip: Secondary | ICD-10-CM | POA: Diagnosis not present

## 2014-02-07 DIAGNOSIS — M47816 Spondylosis without myelopathy or radiculopathy, lumbar region: Secondary | ICD-10-CM | POA: Diagnosis not present

## 2014-02-14 ENCOUNTER — Other Ambulatory Visit: Payer: Self-pay | Admitting: Dermatology

## 2014-02-14 DIAGNOSIS — L821 Other seborrheic keratosis: Secondary | ICD-10-CM | POA: Diagnosis not present

## 2014-02-14 DIAGNOSIS — Z85828 Personal history of other malignant neoplasm of skin: Secondary | ICD-10-CM | POA: Diagnosis not present

## 2014-02-14 DIAGNOSIS — L570 Actinic keratosis: Secondary | ICD-10-CM | POA: Diagnosis not present

## 2014-02-14 DIAGNOSIS — Z808 Family history of malignant neoplasm of other organs or systems: Secondary | ICD-10-CM | POA: Diagnosis not present

## 2014-02-14 DIAGNOSIS — Z411 Encounter for cosmetic surgery: Secondary | ICD-10-CM | POA: Diagnosis not present

## 2014-02-14 DIAGNOSIS — D229 Melanocytic nevi, unspecified: Secondary | ICD-10-CM | POA: Diagnosis not present

## 2014-02-14 DIAGNOSIS — D485 Neoplasm of uncertain behavior of skin: Secondary | ICD-10-CM | POA: Diagnosis not present

## 2014-02-28 ENCOUNTER — Encounter: Payer: Self-pay | Admitting: Internal Medicine

## 2014-02-28 ENCOUNTER — Ambulatory Visit (INDEPENDENT_AMBULATORY_CARE_PROVIDER_SITE_OTHER): Payer: Medicare Other | Admitting: Internal Medicine

## 2014-02-28 VITALS — BP 126/84 | HR 100 | Temp 98.8°F | Resp 16 | Ht 65.5 in | Wt 237.2 lb

## 2014-02-28 DIAGNOSIS — K5732 Diverticulitis of large intestine without perforation or abscess without bleeding: Secondary | ICD-10-CM | POA: Diagnosis not present

## 2014-02-28 MED ORDER — CIPROFLOXACIN HCL 500 MG PO TABS: ORAL_TABLET | ORAL | Status: DC

## 2014-02-28 MED ORDER — HYDROCODONE-ACETAMINOPHEN 5-325 MG PO TABS: ORAL_TABLET | ORAL | Status: DC

## 2014-02-28 MED ORDER — ONDANSETRON HCL 8 MG PO TABS: ORAL_TABLET | ORAL | Status: DC

## 2014-02-28 MED ORDER — METRONIDAZOLE 500 MG PO TABS: ORAL_TABLET | ORAL | Status: DC

## 2014-02-28 NOTE — Patient Instructions (Signed)

## 2014-02-28 NOTE — Progress Notes (Signed)
Subjective:    Patient ID: Kimberly Bowers, female    DOB: 01-22-1951, 63 y.o.   MRN: [id number]  HPI  Very nice 63 yo MWF with hx/o prior diverticulitis in 2014 confirmed by Abd CTscan presents with 12-18 hr hx/o similar sx's of LLQ pain & chills. No N/V/D/C melena/hematochezia & no GU/UT sx's.   Medication Sig  . ALPRAZolam (XANAX) 0.25 MG tablet take 1/2 to 1 tablet by mouth three times a day if needed for anxiety  . Calcium Carbonate-Vit D-Min (CALCIUM 600 + MINERALS PO) Take by mouth daily.  . Cholecalciferol (VITAMIN D) 2000 UNITS tablet Take 2,000 Units by mouth daily.  . diclofenac (VOLTAREN) 75 MG EC tablet Take 75 mg by mouth 2 (two) times daily.  . fexofenadine (ALLEGRA) 60 MG tablet Take 60 mg by mouth daily as needed for allergies or rhinitis.  . Flaxseed, Linseed, (FLAX SEED OIL) 1000 MG CAPS Take 1,000 mg by mouth daily.  Marland Kitchen levothyroxine (SYNTHROID, LEVOTHROID) 100 MCG tablet Take 1.5 tabs MWF = 150 mcg and 1 tab = 100 mcg rest of week  . mometasone (NASONEX) 50 MCG/ACT nasal spray Place 2 sprays into the nose daily.  . Wheat Dextrin (BENEFIBER) POWD Take by mouth 2 (two) times daily.   No Known Allergies   Past Medical History  Diagnosis Date  . Anxiety   . Hypothyroid   . Neutropenia   . Allergy   . Hypertension   . Diverticulitis 2004   Review of Systems  In addition to the HPI above,  (+)  Fever-chills,  No Headache, No changes with Vision or hearing,  No problems swallowing food or Liquids,  No Chest pain or productive Cough or Shortness of Breath,  (+) LLQ Abdominal pain, No Nausea or Vomitting, Bowel movements are regular,  No Blood in stool or Urine,  No dysuria,  No new skin rashes or bruises,  No new joints pains-aches,  No new weakness, tingling, numbness in any extremity,  No recent weight loss,  No polyuria, polydypsia or polyphagia,  No significant Mental Stressors.  A full 10 point Review of Systems was done, except as stated above, all other  Review of Systems were negative     Objective:   Physical Exam  BP 126/84  Pulse 100  Temp 98.8 F   Resp 16  Ht 5' 5.5"   Wt 237 lb 3.2 oz     BMI 38.86   LMP - 2002  HEENT - Eac's patent. TM's Nl. EOM's full. PERRLA. NasoOroPharynx clear. Neck - supple. Nl Thyroid. Carotids 2+ & No bruits, nodes, JVD Chest - Clear equal BS w/o Rales, rhonchi, wheezes. Cor - Nl HS. RRR w/o sig MGR. PP 1(+). No edema. Abd - Soft w/ LLQ tenderness w/o significant guarding and no RB. BS Nl.  MS- FROM w/o deformities. Muscle power, tone and bulk Nl. Gait Nl. Neuro - No obvious Cr N abnormalities. Sensory, motor and Cerebellar functions appear Nl w/o focal abnormalities. Psyche - Mental status normal & appropriate.  No delusions, ideations or obvious mood abnormalities. Skin - no rashes, cyanosis, icterus.     Assessment & Plan:   1. Diverticulitis of colon without hemorrhage  - Liquid diet discussed  - ciprofloxacin (CIPRO) 500 MG tablet; Take 1 tablet 2 x day with food for infection  Dispense: 20 tablet; Refill: 1  - metroNIDAZOLE (FLAGYL) 500 MG tablet; Take 1 tablet 3 x dayy with food for infection  Dispense: 30 tablet; Refill: 1  -  HYDROcodone-acetaminophen (NORCO) 5-325 MG per tablet; Take 1 to 2 tablets every 3 to 4 hours if needed for severe pain ( max 8 tablets/24 hours)  Dispense: 50 tablet;   - ondansetron (ZOFRAN) 8 MG tablet; Take 1 tablet 3 x day  if needed for Nausea  Dispense: 20 tablet; Refill: 0  - ROV in 2 days - advised if sx's worsen to go to ER.

## 2014-03-02 ENCOUNTER — Encounter: Payer: Self-pay | Admitting: Internal Medicine

## 2014-03-02 ENCOUNTER — Ambulatory Visit (INDEPENDENT_AMBULATORY_CARE_PROVIDER_SITE_OTHER): Payer: Medicare Other | Admitting: Internal Medicine

## 2014-03-02 VITALS — BP 122/84 | HR 84 | Temp 98.1°F | Resp 16 | Ht 65.5 in | Wt 235.2 lb

## 2014-03-02 DIAGNOSIS — K5732 Diverticulitis of large intestine without perforation or abscess without bleeding: Secondary | ICD-10-CM | POA: Diagnosis not present

## 2014-03-06 NOTE — Progress Notes (Signed)
   Subjective:    Patient ID: Kimberly Bowers, female    DOB: 07-22-1950, 63 y.o.   MRN: [id number]  HPI  Patient returns for 3 day f/u tx/o suspected acute diverticulitis and is significantly improved in the interim. She also has has some HB 7 mild reflux which has improved since starting Omeprazole. Some N - no vomiting . Abd cramping/bloating is better & no D/C/ melena or hematochezia.  Medication Sig  . ALPRAZolam (XANAX) 0.25 MG tablet take 1/2 to 1 tablet by mouth three times a day if needed for anxiety  . Calcium Carbonate-Vit D-Min (CALCIUM 600 + MINERALS PO) Take by mouth daily.  . Cholecalciferol (VITAMIN D) 2000 UNITS tablet Take 2,000 Units by mouth daily.  . ciprofloxacin (CIPRO) 500 MG tablet Take 1 tablet 2 x day with food for infection  . diclofenac (VOLTAREN) 75 MG EC tablet Take 75 mg by mouth 2 (two) times daily.  . fexofenadine (ALLEGRA) 60 MG tablet Take 60 mg by mouth daily as needed for allergies or rhinitis.  . Flaxseed, Linseed, (FLAX SEED OIL) 1000 MG CAPS Take 1,000 mg by mouth daily.  Marland Kitchen FLUZONE HIGH-DOSE 0.5 ML SUSY   . HYDROcodone-acetaminophen (NORCO) 5-325 MG per tablet Take 1 to 2 tablets every 3 to 4 hours if needed for severe pain ( max 8 tablets/24 hours)  . levothyroxine (SYNTHROID, LEVOTHROID) 100 MCG tablet Take 1.5 tabs MWF = 150 mcg and 1 tab = 100 mcg rest of week  . metroNIDAZOLE (FLAGYL) 500 MG tablet Take 1 tablet 3 x dayy with food for infection  . mometasone (NASONEX) 50 MCG/ACT nasal spray Place 2 sprays into the nose daily.  . ondansetron (ZOFRAN) 8 MG tablet Take 1 tablet 3 x day  if needed for Nausea  . Wheat Dextrin (BENEFIBER) POWD Take by mouth 2 (two) times daily.   No Known Allergies   Past Medical History  Diagnosis Date  . Anxiety   . Hypothyroid   . Neutropenia   . Allergy   . Hypertension   . Diverticulitis 2004   Review of Systems  Negative except as above and non contributory.      Objective:   Physical Exam   BP 122/84    Pulse 84  Temp 98.1 F   Resp 16  Ht 5' 5.5"  Wt 235 lb 3.2 oz       BMI 38.53       LMP 03/11/2000   HEENT - Eac's patent. TM's Nl. EOM's full. PERRLA. NasoOroPharynx clear. Neck - supple. Nl Thyroid. Carotids 2+ & No bruits, nodes, JVD Chest - Clear equal BS w/o Rales, rhonchi, wheezes. Cor - Nl HS. RRR w/o sig MGR. PP 1(+). No edema. Abd - Soft, flat & doughy with minimal LLQ tenderness w/o G or RB. BS nl. MS- FROM w/o deformities. Gait Nl. Neuro - No obvious Cr N abnormalities. Sensory, motor and Cerebellar functions appear Nl w/o focal abnormalities. Psyche - Mental status normal & appropriate.     Assessment & Plan:   1. Diverticulitis, improving  - Advance diet slowly as tolerated   - complete Abx's (& has RF if sx's persist)   - has f/u ~ 2 weeks w/ Vicie Mutters, PA-C.

## 2014-03-16 DIAGNOSIS — M238X1 Other internal derangements of right knee: Secondary | ICD-10-CM | POA: Diagnosis not present

## 2014-03-17 ENCOUNTER — Ambulatory Visit (INDEPENDENT_AMBULATORY_CARE_PROVIDER_SITE_OTHER): Payer: Medicare Other | Admitting: Physician Assistant

## 2014-03-17 ENCOUNTER — Encounter: Payer: Self-pay | Admitting: Physician Assistant

## 2014-03-17 VITALS — BP 128/80 | HR 84 | Temp 98.1°F | Resp 16 | Wt 235.0 lb

## 2014-03-17 DIAGNOSIS — Z789 Other specified health status: Secondary | ICD-10-CM

## 2014-03-17 DIAGNOSIS — E669 Obesity, unspecified: Secondary | ICD-10-CM

## 2014-03-17 DIAGNOSIS — Z0001 Encounter for general adult medical examination with abnormal findings: Secondary | ICD-10-CM

## 2014-03-17 DIAGNOSIS — E782 Mixed hyperlipidemia: Secondary | ICD-10-CM | POA: Diagnosis not present

## 2014-03-17 DIAGNOSIS — F419 Anxiety disorder, unspecified: Secondary | ICD-10-CM

## 2014-03-17 DIAGNOSIS — Z79899 Other long term (current) drug therapy: Secondary | ICD-10-CM | POA: Diagnosis not present

## 2014-03-17 DIAGNOSIS — E039 Hypothyroidism, unspecified: Secondary | ICD-10-CM

## 2014-03-17 DIAGNOSIS — Z1331 Encounter for screening for depression: Secondary | ICD-10-CM

## 2014-03-17 DIAGNOSIS — D709 Neutropenia, unspecified: Secondary | ICD-10-CM

## 2014-03-17 DIAGNOSIS — T7840XD Allergy, unspecified, subsequent encounter: Secondary | ICD-10-CM

## 2014-03-17 DIAGNOSIS — R6889 Other general symptoms and signs: Secondary | ICD-10-CM | POA: Diagnosis not present

## 2014-03-17 DIAGNOSIS — E559 Vitamin D deficiency, unspecified: Secondary | ICD-10-CM

## 2014-03-17 LAB — CBC WITH DIFFERENTIAL/PLATELET
BASOS ABS: 0 10*3/uL (ref 0.0–0.1)
BASOS PCT: 1 % (ref 0–1)
EOS ABS: 0.1 10*3/uL (ref 0.0–0.7)
Eosinophils Relative: 2 % (ref 0–5)
HCT: 41.7 % (ref 36.0–46.0)
Hemoglobin: 14 g/dL (ref 12.0–15.0)
LYMPHS PCT: 21 % (ref 12–46)
Lymphs Abs: 1 10*3/uL (ref 0.7–4.0)
MCH: 32.5 pg (ref 26.0–34.0)
MCHC: 33.6 g/dL (ref 30.0–36.0)
MCV: 96.8 fL (ref 78.0–100.0)
MPV: 8.8 fL (ref 8.6–12.4)
Monocytes Absolute: 0.3 10*3/uL (ref 0.1–1.0)
Monocytes Relative: 7 % (ref 3–12)
NEUTROS ABS: 3.2 10*3/uL (ref 1.7–7.7)
NEUTROS PCT: 69 % (ref 43–77)
PLATELETS: 392 10*3/uL (ref 150–400)
RBC: 4.31 MIL/uL (ref 3.87–5.11)
RDW: 14.4 % (ref 11.5–15.5)
WBC: 4.6 10*3/uL (ref 4.0–10.5)

## 2014-03-17 LAB — BASIC METABOLIC PANEL WITH GFR
BUN: 18 mg/dL (ref 6–23)
CALCIUM: 9.2 mg/dL (ref 8.4–10.5)
CO2: 26 meq/L (ref 19–32)
Chloride: 101 mEq/L (ref 96–112)
Creat: 0.82 mg/dL (ref 0.50–1.10)
GFR, Est African American: 87 mL/min
GFR, Est Non African American: 75 mL/min
Glucose, Bld: 85 mg/dL (ref 70–99)
POTASSIUM: 4.4 meq/L (ref 3.5–5.3)
SODIUM: 139 meq/L (ref 135–145)

## 2014-03-17 LAB — HEPATIC FUNCTION PANEL
ALK PHOS: 52 U/L (ref 39–117)
ALT: 15 U/L (ref 0–35)
AST: 20 U/L (ref 0–37)
Albumin: 4.3 g/dL (ref 3.5–5.2)
BILIRUBIN INDIRECT: 0.6 mg/dL (ref 0.2–1.2)
Bilirubin, Direct: 0.1 mg/dL (ref 0.0–0.3)
Total Bilirubin: 0.7 mg/dL (ref 0.2–1.2)
Total Protein: 6.8 g/dL (ref 6.0–8.3)

## 2014-03-17 LAB — LIPID PANEL
CHOLESTEROL: 242 mg/dL — AB (ref 0–200)
HDL: 70 mg/dL (ref >39)
LDL Cholesterol: 153 mg/dL — ABNORMAL HIGH (ref 0–99)
TRIGLYCERIDES: 97 mg/dL (ref <150)
Total CHOL/HDL Ratio: 3.5 Ratio
VLDL: 19 mg/dL (ref 0–40)

## 2014-03-17 LAB — TSH: TSH: 1.643 u[IU]/mL (ref 0.350–4.500)

## 2014-03-17 LAB — MAGNESIUM: MAGNESIUM: 2.1 mg/dL (ref 1.5–2.5)

## 2014-03-17 NOTE — Patient Instructions (Signed)
We want weight loss that will last so you should lose 1-2 pounds a week.  THAT IS IT! Please pick THREE things a month to change. Once it is a habit check off the item. Then pick another three items off the list to become habits.  If you are already doing a habit on the list GREAT!  Cross that item off! o Don't drink your calories. Ie, alcohol, soda, fruit juice, and sweet tea.  o Drink more water. Drink a glass when you feel hungry or before each meal.  o Eat breakfast - Complex carb and protein (likeDannon light and fit yogurt, oatmeal, fruit, eggs, turkey bacon). o Measure your cereal.  Eat no more than one cup a day. (ie Kashi) o Eat an apple a day. o Add a vegetable a day. o Try a new vegetable a month. o Use Pam! Stop using oil or butter to cook. o Don't finish your plate or use smaller plates. o Share your dessert. o Eat sugar free Jello for dessert or frozen grapes. o Don't eat 2-3 hours before bed. o Switch to whole wheat bread, pasta, and brown rice. o Make healthier choices when you eat out. No fries! o Pick baked chicken, NOT fried. o Don't forget to SLOW DOWN when you eat. It is not going anywhere.  o Take the stairs. o Park far away in the parking lot o Lift soup cans (or weights) for 10 minutes while watching TV. o Walk at work for 10 minutes during break. o Walk outside 1 time a week with your friend, kids, dog, or significant other. o Start a walking group at church. o Walk the mall as much as you can tolerate.  o Keep a food diary. o Weigh yourself daily. o Walk for 15 minutes 3 days per week. o Cook at home more often and eat out less.  If life happens and you go back to old habits, it is okay.  Just start over. You can do it!   If you experience chest pain, get short of breath, or tired during the exercise, please stop immediately and inform your doctor.      Bad carbs also include fruit juice, alcohol, and sweet tea. These are empty calories that do not signal  to your brain that you are full.   Please remember the good carbs are still carbs which convert into sugar. So please measure them out no more than 1/2-1 cup of rice, oatmeal, pasta, and beans  Veggies are however free foods! Pile them on.   Not all fruit is created equal. Please see the list below, the fruit at the bottom is higher in sugars than the fruit at the top. Please avoid all dried fruits.     

## 2014-03-17 NOTE — Progress Notes (Signed)
WELCOME TO MEDICARE VISIT AND FOLLOW UP  Assessment:   1. Hypothyroidism, unspecified hypothyroidism type Hypothyroidism-check TSH level, continue medications the same, reminded to take on an empty stomach 30-12mins before food.  - TSH  2. Mixed hyperlipidemia -continue medications, check lipids, decrease fatty foods, increase activity.  - CBC with Differential - BASIC METABOLIC PANEL WITH GFR - Hepatic function panel - Lipid panel  3. Obesity Obesity with co morbidities- long discussion about weight loss, diet, and exercise  4. Vitamin D deficiency Continue supplement  5. Medication management - Magnesium  6. Anxiety  stress management techniques discussed, increase water, good sleep hygiene discussed, increase exercise, and increase veggies.   7. Neutropenia Check CBC  8. Allergy, subsequent encounter Allergic rhinitis- Allegra OTC, increase H20, allergy hygiene explained.   Plan:   During the course of the visit the patient was educated and counseled about appropriate screening and preventive services including:    Pneumococcal vaccine   Influenza vaccine  Td vaccine  Screening electrocardiogram  Screening mammography  Bone densitometry screening  Colorectal cancer screening  Diabetes screening  Glaucoma screening  Nutrition counseling   Advanced directives: given info/requested  Screening recommendations, referrals:  Vaccinations: Please see documentation below and orders this visit.   Nutrition assessed and recommended  Colonoscopy due 2019 Mammogram due 04/2014 Pap smear not indicated Pelvic exam not indicated Recommended yearly ophthalmology/optometry visit for glaucoma screening and checkup Recommended yearly dental visit for hygiene and checkup Advanced directives - requested  Conditions/risks identified: BMI: Discussed weight loss, diet, and increase physical activity.  Increase physical activity: AHA recommends 150 minutes of  physical activity a week.  Medications reviewed DEXA- declined Urinary Incontinence is not an issue: discussed non pharmacology and pharmacology options.  Fall risk: low- discussed PT, home fall assessment, medications.    Subjective:   Kimberly Bowers is a 64 y.o. female who presents for Welcome to Medicare Visit and 3 month follow up on hypertension, hypothyroidism, obesity, hyperlipidemia, vitamin D def.   Her blood pressure has been controlled at home, today their BP is BP: 128/80 mmHg She does workout. She denies chest pain, shortness of breath, dizziness.  She is not on cholesterol medication and denies myalgias. Her cholesterol is at goal. The cholesterol last visit was:   Lab Results  Component Value Date   CHOL 198 09/24/2013   HDL 74 09/24/2013   LDLCALC 110* 09/24/2013   TRIG 69 09/24/2013   CHOLHDL 2.7 09/24/2013  Last A1C in the office was:  Lab Results  Component Value Date   HGBA1C 5.2 08/05/2013  Patient is on Vitamin D supplement.   Has history of diverticulitis, had an episode a year ago and had a recent episode in Dec, took flaygl and cipro and is feeling better. She is on fiber daily.  She is on thyroid medication. Her medication was changed last visit. She is on 19mcg, M,W,F she is on 1.5 pills, and 1 pill the rest of the days.  Lab Results  Component Value Date   TSH 0.457 08/05/2013  BMI is Body mass index is 38.5 kg/(m^2)., she is working on diet and exercise, she has consultation at [facility]. . Wt Readings from Last 3 Encounters:  03/17/14 235 lb (106.595 kg)  03/02/14 235 lb 3.2 oz (106.686 kg)  02/28/14 237 lb 3.2 oz (107.593 kg)    Names of Other Physician/Practitioners you currently use: 1. [facility]- here for primary care 2. Dr. Talbert Forest,  eye doctor, last visit Sept 2015 3. Dr. Orvil Feil, dentist, last visit 6 months ago Patient Care Team: Unk Pinto, MD as PCP - General (Internal Medicine)   Dr. Noemi Chapel Dr. Sharlett Iles   Medication Review Current Outpatient Prescriptions on File Prior to Visit  Medication Sig Dispense Refill  . ALPRAZolam (XANAX) 0.25 MG tablet take 1/2 to 1 tablet by mouth three times a day if needed for anxiety 90 tablet 1  . Calcium Carbonate-Vit D-Min (CALCIUM 600 + MINERALS PO) Take by mouth daily.    . Cholecalciferol (VITAMIN D) 2000 UNITS tablet Take 2,000 Units by mouth daily.    . diclofenac (VOLTAREN) 75 MG EC tablet Take 75 mg by mouth 2 (two) times daily.    . fexofenadine (ALLEGRA) 60 MG tablet Take 60 mg by mouth daily as needed for allergies or rhinitis.    . Flaxseed, Linseed, (FLAX SEED OIL) 1000 MG CAPS Take 1,000 mg by mouth daily.    Marland Kitchen FLUZONE HIGH-DOSE 0.5 ML SUSY   0  . levothyroxine (SYNTHROID, LEVOTHROID) 100 MCG tablet Take 1.5 tabs MWF = 150 mcg and 1 tab = 100 mcg rest of week 110 tablet 1  . mometasone (NASONEX) 50 MCG/ACT nasal spray Place 2 sprays into the nose daily. 17 g 2  . Wheat Dextrin (BENEFIBER) POWD Take by mouth 2 (two) times daily.     No current facility-administered medications on file prior to visit.    Current Problems (verified) Patient Active Problem List   Diagnosis Date Noted  . Obesity 03/17/2014  . Vitamin D deficiency 03/17/2014  . Medication management 03/17/2014  . Mixed hyperlipidemia 03/25/2013  . Anxiety   . Hypothyroid   . Neutropenia   . Allergy     Screening Tests Health Maintenance  Topic Date Due  . PNEUMOCOCCAL POLYSACCHARIDE VACCINE AGE 59 AND OVER  08/11/2013  . INFLUENZA VACCINE  10/10/2014  . MAMMOGRAM  04/23/2015  . COLONOSCOPY  10/22/2017  . TETANUS/TDAP  07/09/2022  . DEXA SCAN  Completed  . ZOSTAVAX  Completed     Immunization History  Administered Date(s) Administered  . Influenza Split 12/08/2013  . Influenza-Unspecified 12/29/2012  . Td 03/11/2004  . Tdap 07/08/2012  . Zoster 07/08/2012    Preventative care: Last colonoscopy: 2009 due 2019 EGD: 2009 Last  mammogram: 04/2013 Last pap smear/pelvic exam: 07/2013   DEXA:04/2013 CXR 2006  Prior vaccinations: TD or Tdap: 2014  Influenza: 2015 Pneumococcal: will get after prenver Prevnar13: DUE Shingles/Zostavax: 2014  History reviewed: allergies, current medications, past family history, past medical history, past social history, past surgical history and problem list    Medication List       This list is accurate as of: 03/17/14 10:38 AM.  Always use your most recent med list.               ALPRAZolam 0.25 MG tablet  Commonly known as:  XANAX  take 1/2 to 1 tablet by mouth three times a day if needed for anxiety     BENEFIBER Powd  Take by mouth 2 (two) times daily.     CALCIUM 600 + MINERALS PO  Take by mouth daily.     diclofenac 75 MG EC tablet  Commonly known as:  VOLTAREN  Take 75 mg by mouth 2 (two) times daily.     fexofenadine 60 MG tablet  Commonly known as:  ALLEGRA  Take 60 mg by mouth daily as needed for allergies or rhinitis.  Flax Seed Oil 1000 MG Caps  Take 1,000 mg by mouth daily.     FLUZONE HIGH-DOSE 0.5 ML Susy  Generic drug:  Influenza Vac Split High-Dose     levothyroxine 100 MCG tablet  Commonly known as:  SYNTHROID, LEVOTHROID  Take 1.5 tabs MWF = 150 mcg and 1 tab = 100 mcg rest of week     mometasone 50 MCG/ACT nasal spray  Commonly known as:  NASONEX  Place 2 sprays into the nose daily.     Vitamin D 2000 UNITS tablet  Take 2,000 Units by mouth daily.        Past Surgical History  Procedure Laterality Date  . Laparoscopic gastric banding  2004  . Knee arthroscopy Left   . Uterine fibroid surgery  2001  . Foot surgery Right     benign cyst  . Tubal ligation      age 31  . Laparoscopic repair and removal of gastric band      8/14   Family History  Problem Relation Age of Onset  . Cancer Mother     lung  . Diabetes Father     Risk Factors: Osteoporosis/FallRisk: postmenopausal estrogen deficiency and dietary calcium  and/or vitamin D deficiency In the past year have you fallen or had a near fall?:No History of fracture in the past year: no  Tobacco History  Substance Use Topics  . Smoking status: Never Smoker   . Smokeless tobacco: Not on file  . Alcohol Use: 1.2 - 1.8 oz/week    2-3 Glasses of wine per week   She does not smoke.  Patient is not a former smoker. Are there smokers in your home (other than you)?  No  Alcohol Current alcohol use: social drinker  Caffeine Current caffeine use: coffee 1 /day  Exercise Current exercise: none and due to back pain  Nutrition/Diet Current diet: in general, a "healthy" diet    Cardiac risk factors: advanced age (older than 56 for men, 22 for women), dyslipidemia, hypertension, obesity (BMI >= 30 kg/m2) and sedentary lifestyle.  Depression Screen (Note: if answer to either of the following is "Yes", a more complete depression screening is indicated)   Q1: Over the past two weeks, have you felt down, depressed or hopeless? No  Q2: Over the past two weeks, have you felt little interest or pleasure in doing things? No  Have you lost interest or pleasure in daily life? No  Do you often feel hopeless? No  Do you cry easily over simple problems? No  Activities of Daily Living In your present state of health, do you have any difficulty performing the following activities?:  Driving? No Managing money?  No Feeding yourself? No Getting from bed to chair? No Climbing a flight of stairs? No Preparing food and eating?: No Bathing or showering? No Getting dressed: No Getting to the toilet? No Using the toilet:No Moving around from place to place: No   Are you sexually active?  No  Do you have more than one partner?  No  Vision Difficulties: No  Hearing Difficulties: No Do you often ask people to speak up or repeat themselves? No Do you experience ringing or noises in your ears? No Do you have difficulty understanding soft or whispered voices?  No  Cognition  Do you feel that you have a problem with memory?No  Do you often misplace items? No  Do you feel safe at home?  Yes  Advanced directives Does patient have a  Health Care Power of Attorney? Yes Does patient have a Living Will? Yes   Objective:   Blood pressure 128/80, pulse 84, temperature 98.1 F (36.7 C), resp. rate 16, weight 235 lb (106.595 kg), last menstrual period 03/11/2000. Body mass index is 38.5 kg/(m^2).  General appearance: alert, no distress, WD/WN,  female Cognitive Testing  Alert? Yes  Normal Appearance?Yes  Oriented to person? Yes  Place? Yes   Time? Yes  Recall of three objects?  Yes  Can perform simple calculations? Yes  Displays appropriate judgment?Yes  Can read the correct time from a watch face?Yes  HEENT: normocephalic, sclerae anicteric, TMs pearly, nares patent, no discharge or erythema, pharynx normal Oral cavity: MMM, no lesions Neck: supple, no lymphadenopathy, no thyromegaly, no masses Heart: RRR, normal S1, S2, no murmurs Lungs: CTA bilaterally, no wheezes, rhonchi, or rales Abdomen: +bs, soft, non tender, non distended, no masses, no hepatomegaly, no splenomegaly Musculoskeletal: nontender, no swelling, no obvious deformity Extremities: no edema, no cyanosis, no clubbing Pulses: 2+ symmetric, upper and lower extremities, normal cap refill Neurological: alert, oriented x 3, CN2-12 intact, strength normal upper extremities and lower extremities, sensation normal throughout, DTRs 2+ throughout, no cerebellar signs, gait normal Psychiatric: normal affect, behavior normal, pleasant  Breast: defer Gyn: defer Rectal: defer  Medicare Attestation I have personally reviewed: The patient's medical and social history Their use of alcohol, tobacco or illicit drugs Their current medications and supplements The patient's functional ability including ADLs,fall risks, home safety risks, cognitive, and hearing and visual impairment Diet and  physical activities Evidence for depression or mood disorders  The patient's weight, height, BMI, and visual acuity have been recorded in the chart.  I have made referrals, counseling, and provided education to the patient based on review of the above and I have provided the patient with a written personalized care plan for preventive services.     Vicie Mutters, PA-C   03/17/2014   Review of Systems:  ROS

## 2014-03-21 DIAGNOSIS — M1711 Unilateral primary osteoarthritis, right knee: Secondary | ICD-10-CM | POA: Diagnosis not present

## 2014-03-24 ENCOUNTER — Ambulatory Visit (INDEPENDENT_AMBULATORY_CARE_PROVIDER_SITE_OTHER): Payer: Medicare Other | Admitting: Physician Assistant

## 2014-03-24 VITALS — BP 142/88 | HR 80 | Temp 98.1°F | Resp 16 | Wt 235.0 lb

## 2014-03-24 DIAGNOSIS — J029 Acute pharyngitis, unspecified: Secondary | ICD-10-CM

## 2014-03-24 MED ORDER — MOMETASONE FUROATE 50 MCG/ACT NA SUSP: 2.0000 | Freq: Every day | NASAL | Status: DC

## 2014-03-24 MED ORDER — AZITHROMYCIN 250 MG PO TABS: ORAL_TABLET | ORAL | Status: DC

## 2014-03-24 NOTE — Patient Instructions (Signed)
HOW TO TREAT VIRAL COUGH AND COLD SYMPTOMS:  -Symptoms usually last at least 1 week with the worst symptoms being around day 4.  -No antibiotics are needed for cold, flu, sore throats, cough, bronchitis UNLESS symptoms are longer than 7 days. -There are a lot of combination medications (Dayquil, Nyquil, Vicks 44, tyelnol cold and sinus, ETC). Please look at the ingredients on the back so that you are treating the correct symptoms and not doubling up on medications/ingredients.    Your Symptom Medicines you can use  Nasal congestion - pseudoephedrine (Sudafed)- behind the counter, do not use if you have high blood pressure, medicine that have -D in them.  - phenylephrine (Sudafed PE) -Dextormethorphan + chlorpheniramine (Coridcidin HBP)- okay if you have high blood pressure -Oxymetazoline (Afrin) nasal spray- LIMIT to 3 days -Saline nasal spray -Neti pot (used distilled or bottled water)  Ear pain/congestion -pseudoephedrine (sudafed) - Nasonex/flonase nasal spray  Fever -Acetaminophen (Tyelnol) -Ibuprofen (Advil, motrin, aleve)  Sore Throat -Acetaminophen (Tyelnol) -Ibuprofen (Advil, motrin, aleve) -Drink a lot of water -Gargle with salt water - Rest your voice (don't talk) -Throat sprays -Cough drops  Body Aches -Acetaminophen (Tyelnol) -Ibuprofen (Advil, motrin, aleve)  Headache -Acetaminophen (Tyelnol) -Ibuprofen (Advil, motrin, aleve) - Exedrin, Exedrin Migraine  Allergy symptoms (cough, sneeze, runny nose, itchy eyes) -Claritin or loratadine cheapest but likely the weakest  -Zyrtec or certizine at night because it can make you sleepy -The strongest is allegra or fexafinadine  Cheapest at [employer], [employer], [employer]  Cough -Dextromethorphan (Delsym)- medicine that has DM in it -Guafenesin (Mucinex/Robitussin) - cough drops - drink lots of water  Chest Congestion -Guafenesin (Mucinex/Robitussin)  Red Itchy Eyes - Naphcon-A  Upset Stomach - Bland diet (nothing spicy, greasy,  fried, and high acid foods like tomatoes, oranges, berries) -OKAY- cereal, bread, soup, crackers, rice -Eat smaller more frequent meals -reduce caffeine, no alcohol -Loperamide (Imodium-AD) if diarrhea -Prevacid for heart burn  General health when sick -Hydration -wash your hands frequently -keep surfaces clean -change pillow cases and sheets often -Get fresh air but do not exercise strenuously -Vitamin D, double up on it - Vitamin C -Zinc     -Make sure you are drinking plenty of fluids to stay hydrated.  -while drinking fluids pinch and hold nose close and swallow, to help open eustachian tubes and drain fluid behind ear drums. -you can do salt water gargles. You can also do 1 TSP liquid Maalox and 1 TSP liquid benadryl- mix/ gargle/ spit  If you are not feeling better in 10-14 days, then please call the office.  Pharyngitis Pharyngitis is redness, pain, and swelling (inflammation) of your pharynx.  CAUSES  Pharyngitis is usually caused by infection. Most of the time, these infections are from viruses (viral) and are part of a cold. However, sometimes pharyngitis is caused by bacteria (bacterial). Pharyngitis can also be caused by allergies. Viral pharyngitis may be spread from person to person by coughing, sneezing, and personal items or utensils (cups, forks, spoons, toothbrushes). Bacterial pharyngitis may be spread from person to person by more intimate contact, such as kissing.  SIGNS AND SYMPTOMS  Symptoms of pharyngitis include:   Sore throat.   Tiredness (fatigue).   Low-grade fever.   Headache.  Joint pain and muscle aches.  Skin rashes.  Swollen lymph nodes.  Plaque-like film on throat or tonsils (often seen with bacterial pharyngitis). DIAGNOSIS  Your health care provider will ask you questions about your illness and your symptoms. Your medical history, along with a  physical exam, is often all that is needed to diagnose pharyngitis. Sometimes, a  rapid strep test is done. Other lab tests may also be done, depending on the suspected cause.  TREATMENT  Viral pharyngitis will usually get better in 3-4 days without the use of medicine. Bacterial pharyngitis is treated with medicines that kill germs (antibiotics).  HOME CARE INSTRUCTIONS   Drink enough water and fluids to keep your urine clear or pale yellow.   Only take over-the-counter or prescription medicines as directed by your health care provider:   If you are prescribed antibiotics, make sure you finish them even if you start to feel better.   Do not take aspirin.   Get lots of rest.   Gargle with 8 oz of salt water ( tsp of salt per 1 qt of water) as often as every 1-2 hours to soothe your throat.   Throat lozenges (if you are not at risk for choking) or sprays may be used to soothe your throat. SEEK MEDICAL CARE IF:   You have large, tender lumps in your neck.  You have a rash.  You cough up green, yellow-brown, or bloody spit. SEEK IMMEDIATE MEDICAL CARE IF:   Your neck becomes stiff.  You drool or are unable to swallow liquids.  You vomit or are unable to keep medicines or liquids down.  You have severe pain that does not go away with the use of recommended medicines.  You have trouble breathing (not caused by a stuffy nose). MAKE SURE YOU:   Understand these instructions.  Will watch your condition.  Will get help right away if you are not doing well or get worse. Document Released: 02/25/2005 Document Revised: 12/16/2012 Document Reviewed: 11/02/2012 [facility] Patient Information 2015 [place], [place]. This information is not intended to replace advice given to you by your health care provider. Make sure you discuss any questions you have with your health care provider.

## 2014-03-24 NOTE — Progress Notes (Signed)
Subjective:    Patient ID: Kimberly Bowers, female    DOB: 1950-10-25, 64 y.o.   MRN: [id number]  HPI 64 y.o. female with sore throat since yesterday AM, progressively worse. Has history of strep throat, works with kids. Has nonproductive cough, without wheezing or SOB, worse at night. Denies congestion, fever, chills. Has taken mucinex which has helped.   Current Outpatient Prescriptions on File Prior to Visit  Medication Sig Dispense Refill  . ALPRAZolam (XANAX) 0.25 MG tablet take 1/2 to 1 tablet by mouth three times a day if needed for anxiety 90 tablet 1  . Calcium Carbonate-Vit D-Min (CALCIUM 600 + MINERALS PO) Take by mouth daily.    . Cholecalciferol (VITAMIN D) 2000 UNITS tablet Take 2,000 Units by mouth daily.    . diclofenac (VOLTAREN) 75 MG EC tablet Take 75 mg by mouth 2 (two) times daily.    . fexofenadine (ALLEGRA) 60 MG tablet Take 60 mg by mouth daily as needed for allergies or rhinitis.    . Flaxseed, Linseed, (FLAX SEED OIL) 1000 MG CAPS Take 1,000 mg by mouth daily.    Marland Kitchen FLUZONE HIGH-DOSE 0.5 ML SUSY   0  . levothyroxine (SYNTHROID, LEVOTHROID) 100 MCG tablet Take 1.5 tabs MWF = 150 mcg and 1 tab = 100 mcg rest of week 110 tablet 1  . mometasone (NASONEX) 50 MCG/ACT nasal spray Place 2 sprays into the nose daily. 17 g 2  . Wheat Dextrin (BENEFIBER) POWD Take by mouth 2 (two) times daily.     No current facility-administered medications on file prior to visit.   Past Medical History  Diagnosis Date  . Anxiety   . Hypothyroid   . Neutropenia   . Allergy   . Hypertension   . Diverticulitis 2004    Review of Systems  Constitutional: Positive for chills. Negative for fever, diaphoresis and fatigue.  HENT: Positive for congestion, ear pain and sore throat. Negative for ear discharge, facial swelling, hearing loss, postnasal drip, rhinorrhea, sinus pressure, sneezing, tinnitus, trouble swallowing and voice change.   Eyes: Negative.   Respiratory: Positive for cough.  Negative for apnea, choking, chest tightness, shortness of breath, wheezing and stridor.   Cardiovascular: Negative.   Gastrointestinal: Negative.   Musculoskeletal: Positive for neck pain.  Neurological: Negative.  Negative for headaches.       Objective:   Physical Exam  Constitutional: She appears well-developed and well-nourished.  HENT:  Head: Normocephalic and atraumatic.  Right Ear: External ear normal.  Left Ear: External ear normal.  Mouth/Throat: Uvula is midline and mucous membranes are normal. Posterior oropharyngeal edema and posterior oropharyngeal erythema present.  Eyes: Conjunctivae and EOM are normal. Pupils are equal, round, and reactive to light.  Neck: Normal range of motion. Neck supple.  Cardiovascular: Normal rate, regular rhythm and normal heart sounds.   Pulmonary/Chest: Effort normal and breath sounds normal.  Abdominal: Soft. Bowel sounds are normal.  Lymphadenopathy:    She has cervical adenopathy (right side).  Skin: Skin is warm and dry.      Assessment & Plan:  1. Acute pharyngitis, unspecified pharyngitis type [J02.9] increase fluids,  Salt water gargles. If symptoms do not improve in 5-7 days or get worse contact the office or go to ER. Will hold the zpak and take if she is not getting better, increase fluids, rest, cont allergy pill - azithromycin (ZITHROMAX) 250 MG tablet; 2 tablets by mouth today then one tablet daily for 4 days.  Dispense: 6 tablet; Refill:  1  

## 2014-03-28 DIAGNOSIS — M1711 Unilateral primary osteoarthritis, right knee: Secondary | ICD-10-CM | POA: Diagnosis not present

## 2014-03-30 DIAGNOSIS — M1711 Unilateral primary osteoarthritis, right knee: Secondary | ICD-10-CM | POA: Diagnosis not present

## 2014-04-06 DIAGNOSIS — M1711 Unilateral primary osteoarthritis, right knee: Secondary | ICD-10-CM | POA: Diagnosis not present

## 2014-04-13 DIAGNOSIS — M1711 Unilateral primary osteoarthritis, right knee: Secondary | ICD-10-CM | POA: Diagnosis not present

## 2014-05-11 DIAGNOSIS — M1711 Unilateral primary osteoarthritis, right knee: Secondary | ICD-10-CM | POA: Diagnosis not present

## 2014-06-02 ENCOUNTER — Other Ambulatory Visit: Payer: Self-pay | Admitting: Internal Medicine

## 2014-07-26 ENCOUNTER — Encounter: Payer: Self-pay | Admitting: Certified Nurse Midwife

## 2014-07-26 ENCOUNTER — Ambulatory Visit (INDEPENDENT_AMBULATORY_CARE_PROVIDER_SITE_OTHER): Payer: Medicare Other | Admitting: Certified Nurse Midwife

## 2014-07-26 VITALS — BP 124/88 | HR 88 | Resp 14 | Ht 65.5 in | Wt 207.0 lb

## 2014-07-26 DIAGNOSIS — Z780 Asymptomatic menopausal state: Secondary | ICD-10-CM

## 2014-07-26 DIAGNOSIS — Z01419 Encounter for gynecological examination (general) (routine) without abnormal findings: Secondary | ICD-10-CM

## 2014-07-26 NOTE — Patient Instructions (Addendum)

## 2014-07-26 NOTE — Progress Notes (Signed)
64 y.o. [id number] Married  Caucasian Fe here for annual exam. Menopausal no HRT. Denies vaginal bleeding or vaginal dryness. Patient has been on weight loss and down 38 pounds. Continues to work on weight loss. Had mammogram last year with [facility], no report, normal per patient.  Had some issues with cartilage in right knee, but resolving.Sees PCP for aex/labs and hypothyroid management. No other health issues today.  Patient's last menstrual period was 03/11/2000.          Sexually active: Yes.    The current method of family planning is tubal ligation.    Exercising: Yes.    walking and free weights Smoker:  no  Health Maintenance: Pap:  07-22-13 NEG HPV HR- NEG MMG: 04-22-13 Category density C NEG Colonoscopy:  2009 repeat in 10 years BMD:   2015 normal per patient TDaP:  2015 Labs: PCP  Self Breast Exams: twice monthly   reports that she has never smoked. She does not have any smokeless tobacco history on file. She reports that she drinks about 0.6 - 1.2 oz of alcohol per week. She reports that she does not use illicit drugs.  Past Medical History  Diagnosis Date  . Anxiety   . Hypothyroid   . Neutropenia   . Allergy   . Hypertension   . Diverticulitis 2004    Past Surgical History  Procedure Laterality Date  . Laparoscopic gastric banding  2004  . Knee arthroscopy Left   . Uterine fibroid surgery  2001  . Foot surgery Right     benign cyst  . Tubal ligation      age 47  . Laparoscopic repair and removal of gastric band      8/14    Current Outpatient Prescriptions  Medication Sig Dispense Refill  . Calcium Carbonate-Vit D-Min (CALCIUM 600 + MINERALS PO) Take by mouth daily.    . Cholecalciferol (VITAMIN D) 2000 UNITS tablet Take 2,000 Units by mouth daily.    . fexofenadine (ALLEGRA) 60 MG tablet Take 60 mg by mouth daily as needed for allergies or rhinitis.    . mometasone (NASONEX) 50 MCG/ACT nasal spray Place 2 sprays into the nose daily. 17 g 2  . SYNTHROID 100 MCG  tablet take 1 and 1/2 tablets by mouth MONDAY WEDNESDAY AND FRIDAY THEN 1 TABLET THE REST OF THE WEEK 110 tablet 1  . Wheat Dextrin (BENEFIBER) POWD Take by mouth 2 (two) times daily.     No current facility-administered medications for this visit.    Family History  Problem Relation Age of Onset  . Cancer Mother     lung  . Diabetes Father     ROS:  Pertinent items are noted in HPI.  Otherwise, a comprehensive ROS was negative.  Exam:   BP 124/88 mmHg  Pulse 88  Resp 14  Ht 5' 5.5" (1.664 m)  Wt 207 lb (93.895 kg)  BMI 33.91 kg/m2  LMP 03/11/2000 Height: 5' 5.5" (166.4 cm) Ht Readings from Last 3 Encounters:  07/26/14 5' 5.5" (1.664 m)  03/02/14 5' 5.5" (1.664 m)  02/28/14 5' 5.5" (1.664 m)    General appearance: alert, cooperative and appears stated age Head: Normocephalic, without obvious abnormality, atraumatic Neck: no adenopathy, supple, symmetrical, trachea midline and thyroid normal to inspection and palpation Lungs: clear to auscultation bilaterally Breasts: normal appearance, no masses or tenderness, No nipple retraction or dimpling, No nipple discharge or bleeding, No axillary or supraclavicular adenopathy Heart: regular rate and rhythm Abdomen: soft, non-tender;  no masses,  no organomegaly Extremities: extremities normal, atraumatic, no cyanosis or edema Skin: Skin color, texture, turgor normal. No rashes or lesions Lymph nodes: Cervical, supraclavicular, and axillary nodes normal. No abnormal inguinal nodes palpated Neurologic: Grossly normal   Pelvic: External genitalia:  no lesions              Urethra:  normal appearing urethra with no masses, tenderness or lesions              Bartholin's and Skene's: normal                 Vagina: normal appearing vagina with normal color and discharge, no lesions              Cervix: normal,non tender, no lesions              Pap taken: No. Bimanual Exam:  Uterus:  normal size, contour, position, consistency,  mobility, non-tender              Adnexa: normal adnexa and no mass, fullness, tenderness               Rectovaginal: Confirms               Anus:  normal sphincter tone, no lesions  Chaperone present: Yes  A:  Well Woman with normal exam  Menopausal no HRT  Hypothyroid with PCP management  P:   Reviewed health and wellness pertinent to exam  Aware of need to advise if vaginal bleeding.  Continue follow up with PCP as indicated.  Pap smear not taken today   counseled on breast self exam, mammography screening, adequate intake of calcium and vitamin D, diet and exercise return annually or prn  An After Visit Summary was printed and given to the patient.

## 2014-07-26 NOTE — Progress Notes (Signed)
Reviewed personally.  M. Suzanne Delany Steury, MD.  

## 2014-07-29 ENCOUNTER — Ambulatory Visit (INDEPENDENT_AMBULATORY_CARE_PROVIDER_SITE_OTHER): Payer: Medicare Other | Admitting: *Deleted

## 2014-07-29 DIAGNOSIS — E039 Hypothyroidism, unspecified: Secondary | ICD-10-CM

## 2014-07-29 LAB — TSH: TSH: 0.618 u[IU]/mL (ref 0.350–4.500)

## 2014-07-29 NOTE — Progress Notes (Signed)
Patient ID: Kimberly Bowers, female   DOB: [birth year], 64 y.o.   MRN: [id number] Patient presents for TSH recheck per Vicie Mutters, PA-C orders.  States she is currently losing weight and was advised to get TSH rechecked.  She currently is taking Synthroid 100 mcg 1 pill times 4 days and 1 and 1/2 pills times 3 days, M,W,F.

## 2014-09-05 ENCOUNTER — Encounter: Payer: Self-pay | Admitting: Physician Assistant

## 2014-09-05 ENCOUNTER — Ambulatory Visit (INDEPENDENT_AMBULATORY_CARE_PROVIDER_SITE_OTHER): Payer: Medicare Other | Admitting: Physician Assistant

## 2014-09-05 VITALS — BP 110/78 | HR 88 | Temp 97.7°F | Resp 16 | Ht 65.5 in | Wt 190.0 lb

## 2014-09-05 DIAGNOSIS — Z6831 Body mass index (BMI) 31.0-31.9, adult: Secondary | ICD-10-CM

## 2014-09-05 DIAGNOSIS — D709 Neutropenia, unspecified: Secondary | ICD-10-CM

## 2014-09-05 DIAGNOSIS — E782 Mixed hyperlipidemia: Secondary | ICD-10-CM

## 2014-09-05 DIAGNOSIS — Z23 Encounter for immunization: Secondary | ICD-10-CM

## 2014-09-05 DIAGNOSIS — Z79899 Other long term (current) drug therapy: Secondary | ICD-10-CM | POA: Diagnosis not present

## 2014-09-05 DIAGNOSIS — Z1331 Encounter for screening for depression: Secondary | ICD-10-CM

## 2014-09-05 DIAGNOSIS — E039 Hypothyroidism, unspecified: Secondary | ICD-10-CM | POA: Diagnosis not present

## 2014-09-05 DIAGNOSIS — Z789 Other specified health status: Secondary | ICD-10-CM

## 2014-09-05 DIAGNOSIS — F419 Anxiety disorder, unspecified: Secondary | ICD-10-CM

## 2014-09-05 DIAGNOSIS — T7840XD Allergy, unspecified, subsequent encounter: Secondary | ICD-10-CM

## 2014-09-05 DIAGNOSIS — Z0001 Encounter for general adult medical examination with abnormal findings: Secondary | ICD-10-CM

## 2014-09-05 DIAGNOSIS — E559 Vitamin D deficiency, unspecified: Secondary | ICD-10-CM | POA: Diagnosis not present

## 2014-09-05 DIAGNOSIS — E669 Obesity, unspecified: Secondary | ICD-10-CM

## 2014-09-05 LAB — LIPID PANEL
Cholesterol: 181 mg/dL (ref 0–200)
HDL: 58 mg/dL (ref >=46)
LDL Cholesterol: 108 mg/dL — ABNORMAL HIGH (ref 0–99)
Total CHOL/HDL Ratio: 3.1 Ratio
Triglycerides: 74 mg/dL (ref <150)
VLDL: 15 mg/dL (ref 0–40)

## 2014-09-05 LAB — BASIC METABOLIC PANEL WITH GFR
BUN: 11 mg/dL (ref 6–23)
CO2: 27 meq/L (ref 19–32)
Calcium: 9.6 mg/dL (ref 8.4–10.5)
Chloride: 104 mEq/L (ref 96–112)
Creat: 0.71 mg/dL (ref 0.50–1.10)
GFR, Est Non African American: 89 mL/min
Glucose, Bld: 89 mg/dL (ref 70–99)
POTASSIUM: 3.7 meq/L (ref 3.5–5.3)
Sodium: 142 mEq/L (ref 135–145)

## 2014-09-05 LAB — CBC WITH DIFFERENTIAL/PLATELET
BASOS PCT: 1 % (ref 0–1)
Basophils Absolute: 0 10*3/uL (ref 0.0–0.1)
EOS ABS: 0.1 10*3/uL (ref 0.0–0.7)
Eosinophils Relative: 4 % (ref 0–5)
HCT: 39.9 % (ref 36.0–46.0)
Hemoglobin: 13.4 g/dL (ref 12.0–15.0)
Lymphocytes Relative: 27 % (ref 12–46)
Lymphs Abs: 0.9 10*3/uL (ref 0.7–4.0)
MCH: 31.6 pg (ref 26.0–34.0)
MCHC: 33.6 g/dL (ref 30.0–36.0)
MCV: 94.1 fL (ref 78.0–100.0)
MONO ABS: 0.3 10*3/uL (ref 0.1–1.0)
MPV: 9.7 fL (ref 8.6–12.4)
Monocytes Relative: 8 % (ref 3–12)
NEUTROS PCT: 60 % (ref 43–77)
Neutro Abs: 2.1 10*3/uL (ref 1.7–7.7)
PLATELETS: 267 10*3/uL (ref 150–400)
RBC: 4.24 MIL/uL (ref 3.87–5.11)
RDW: 14.5 % (ref 11.5–15.5)
WBC: 3.5 10*3/uL — ABNORMAL LOW (ref 4.0–10.5)

## 2014-09-05 LAB — HEPATIC FUNCTION PANEL
ALBUMIN: 4.1 g/dL (ref 3.5–5.2)
ALT: 22 U/L (ref 0–35)
AST: 28 U/L (ref 0–37)
Alkaline Phosphatase: 68 U/L (ref 39–117)
BILIRUBIN DIRECT: 0.1 mg/dL (ref 0.0–0.3)
Indirect Bilirubin: 0.4 mg/dL (ref 0.2–1.2)
TOTAL PROTEIN: 6.2 g/dL (ref 6.0–8.3)
Total Bilirubin: 0.5 mg/dL (ref 0.2–1.2)

## 2014-09-05 LAB — MAGNESIUM: MAGNESIUM: 1.9 mg/dL (ref 1.5–2.5)

## 2014-09-05 NOTE — Progress Notes (Signed)
Complete Physical  Assessment and Plan: 1. Hypothyroidism, unspecified hypothyroidism type Hypothyroidism-check TSH level, continue medications the same, reminded to take on an empty stomach 30-55mins before food.  - TSH  2. Mixed hyperlipidemia -continue medications, check lipids, decrease fatty foods, increase activity.  - BASIC METABOLIC PANEL WITH GFR - Hepatic function panel - Lipid panel - Urinalysis, Routine w reflex microscopic (not at [facility] - Microalbumin / creatinine urine ratio - EKG 12-Lead  3. Obesity Obesity with co morbidities- long discussion about weight loss, diet, and exercise Doing great, continue weight loss  4. Vitamin D deficiency - Vit D  25 hydroxy (rtn osteoporosis monitoring)  5. Medication management - Magnesium  6. Anxiety continue medications, stress management techniques discussed, increase water, good sleep hygiene discussed, increase exercise, and increase veggies.   7. Allergy, subsequent encounter - Allegra OTC, increase H20, allergy hygiene explained.  8. Neutropenia - CBC with Differential/Platelet  9. Encounter for general adult medical examination with abnormal findings Check to see if she has had MGM this year, get records  10. Screening for depression negative  11. Patient had no falls in past year Low risk   12. Need for prophylactic vaccination against Streptococcus pneumoniae (pneumococcus) - Pneumococcal conjugate vaccine 13-valent IM  Discussed med's effects and SE's. Screening labs and tests as requested with regular follow-up as recommended. Over 40 minutes of exam, counseling, chart review, and complex, high level critical decision making was performed this visit.   HPI  64 y.o. female  presents for a complete physical.  Her blood pressure has been controlled at home, today their BP is BP: 110/78 mmHg She does workout. She denies chest pain, shortness of breath, dizziness.  She is not on cholesterol medication and  denies myalgias. Her cholesterol is not at goal. The cholesterol last visit was:   Lab Results  Component Value Date   CHOL 242* 03/17/2014   HDL 70 03/17/2014   LDLCALC 153* 03/17/2014   TRIG 97 03/17/2014   CHOLHDL 3.5 03/17/2014    Last A1C in the office was:  Lab Results  Component Value Date   HGBA1C 5.2 08/05/2013   Patient is on Vitamin D supplement.   She has a history of diverticulitis, had recent episode in Dec and took flaygl/cipro for this, she is on daily fiber and denies symptoms.  She is on thyroid medication. Her medication was not changed last visit.She is on 133mcg, MWF 1.5 pills and 1 pill the rest of the days.  Lab Results  Component Value Date   TSH 0.618 07/29/2014   Her knee pain is better with the weight loss.  BMI is Body mass index is 31.13 kg/(m^2)., she is working on diet and exercise, she has done air hart healthy weight loss and has lost 45 lbs since Jan. Wt Readings from Last 3 Encounters:  09/05/14 190 lb (86.183 kg)  07/26/14 207 lb (93.895 kg)  03/24/14 235 lb (106.595 kg)     Current Medications:  Current Outpatient Prescriptions on File Prior to Visit  Medication Sig Dispense Refill  . Calcium Carbonate-Vit D-Min (CALCIUM 600 + MINERALS PO) Take by mouth daily.    . Cholecalciferol (VITAMIN D) 2000 UNITS tablet Take 2,000 Units by mouth daily.    . fexofenadine (ALLEGRA) 60 MG tablet Take 60 mg by mouth daily as needed for allergies or rhinitis.    . mometasone (NASONEX) 50 MCG/ACT nasal spray Place 2 sprays into the nose daily. 17 g 2  . SYNTHROID 100  MCG tablet take 1 and 1/2 tablets by mouth MONDAY WEDNESDAY AND FRIDAY THEN 1 TABLET THE REST OF THE WEEK 110 tablet 1  . Wheat Dextrin (BENEFIBER) POWD Take by mouth 2 (two) times daily.     No current facility-administered medications on file prior to visit.   Health Maintenance:   Immunization History  Administered Date(s) Administered  . Influenza Split 12/08/2013  .  Influenza-Unspecified 12/29/2012  . Td 03/11/2004  . Tdap 07/08/2012  . Zoster 07/08/2012   Last colonoscopy: 2009 due 2019 EGD: 2009 Last mammogram: 04/2013 Last pap smear/pelvic exam: 07/2013  DEXA:04/2013 Osteopenia CXR 2006  Prior vaccinations: TD or Tdap: 2014 Influenza: 2015 Pneumococcal: will get after prenver Prevnar13: DUE Shingles/Zostavax: 2014  Names of other physicians: Last Dental Exam: Dr. Orvil Feil, last visit 6 months ago Last Eye Exam:Dr. Bevis, Sept 2015 Patient Care Team: Unk Pinto, MD as PCP - General (Internal Medicine) Sable Feil, MD as Consulting Physician (Gastroenterology) Elsie Saas, MD as Consulting Physician (Orthopedic Surgery) Megan Salon, MD as Consulting Physician (Gynecology)  Allergies: No Known Allergies Medical History:  Past Medical History  Diagnosis Date  . Anxiety   . Hypothyroid   . Neutropenia   . Allergy   . Hypertension   . Diverticulitis 2004   Surgical History:  Past Surgical History  Procedure Laterality Date  . Laparoscopic gastric banding  2004  . Knee arthroscopy Left   . Uterine fibroid surgery  2001  . Foot surgery Right     benign cyst  . Tubal ligation      age 12  . Laparoscopic repair and removal of gastric band      8/14   Family History:  Family History  Problem Relation Age of Onset  . Cancer Mother     lung  . Diabetes Father    Social History:  History  Substance Use Topics  . Smoking status: Never Smoker   . Smokeless tobacco: Not on file  . Alcohol Use: 0.6 - 1.2 oz/week    1-2 Glasses of wine per week   Review of Systems: Review of Systems  Constitutional: Negative.   HENT: Negative.   Eyes: Negative.   Respiratory: Negative.  Negative for cough.   Cardiovascular: Negative.  Negative for chest pain.  Gastrointestinal: Negative.   Genitourinary: Negative.   Musculoskeletal: Positive for myalgias and joint pain. Negative for back pain, falls and neck pain.   Skin: Negative.   Neurological: Negative.   Endo/Heme/Allergies: Negative.   Psychiatric/Behavioral: Negative.  Negative for depression and memory loss. The patient is not nervous/anxious and does not have insomnia.     Physical Exam: Estimated body mass index is 31.13 kg/(m^2) as calculated from the following:   Height as of this encounter: 5' 5.5" (1.664 m).   Weight as of this encounter: 190 lb (86.183 kg). BP 110/78 mmHg  Pulse 88  Temp(Src) 97.7 F (36.5 C)  Resp 16  Ht 5' 5.5" (1.664 m)  Wt 190 lb (86.183 kg)  BMI 31.13 kg/m2  LMP 03/11/2000 General Appearance: Well nourished, in no apparent distress.  Eyes: PERRLA, EOMs, conjunctiva no swelling or erythema, normal fundi and vessels.  Sinuses: No Frontal/maxillary tenderness  ENT/Mouth: Ext aud canals clear, normal light reflex with TMs without erythema, bulging. Good dentition. No erythema, swelling, or exudate on post pharynx. Tonsils not swollen or erythematous. Hearing normal.  Neck: Supple, thyroid normal. No bruits  Respiratory: Respiratory effort normal, BS equal bilaterally without rales, rhonchi, wheezing or  stridor.  Cardio: RRR without murmurs, rubs or gallops. Brisk peripheral pulses without edema.  Chest: symmetric, with normal excursions and percussion.  Breasts: defer OB/GYN Abdomen: Soft, nontender, no guarding, rebound, hernias, masses, or organomegaly.  Lymphatics: Non tender without lymphadenopathy.  Genitourinary: defer OB/GYN Musculoskeletal: Full ROM all peripheral extremities,5/5 strength, and normal gait.  Skin: Warm, dry without rashes, lesions, ecchymosis. Neuro: Cranial nerves intact, reflexes equal bilaterally. Normal muscle tone, no cerebellar symptoms. Sensation intact.  Psych: Awake and oriented X 3, normal affect, Insight and Judgment appropriate.   EKG: WNL no changes. AORTA SCAN: defer, nonsmoker  Vicie Mutters 2:14 PM [facility]

## 2014-09-05 NOTE — Patient Instructions (Signed)
Check about mammogram.   [facility] Schedule an appointment by calling [phone].  Preventive Care for Adults A healthy lifestyle and preventive care can promote health and wellness. Preventive health guidelines for women include the following key practices.  A routine yearly physical is a good way to check with your health care provider about your health and preventive screening. It is a chance to share any concerns and updates on your health and to receive a thorough exam.  Visit your dentist for a routine exam and preventive care every 6 months. Brush your teeth twice a day and floss once a day. Good oral hygiene prevents tooth decay and gum disease.  The frequency of eye exams is based on your age, health, family medical history, use of contact lenses, and other factors. Follow your health care provider's recommendations for frequency of eye exams.  Eat a healthy diet. Foods like vegetables, fruits, whole grains, low-fat dairy products, and lean protein foods contain the nutrients you need without too many calories. Decrease your intake of foods high in solid fats, added sugars, and salt. Eat the right amount of calories for you.Get information about a proper diet from your health care provider, if necessary.  Regular physical exercise is one of the most important things you can do for your health. Most adults should get at least 150 minutes of moderate-intensity exercise (any activity that increases your heart rate and causes you to sweat) each week. In addition, most adults need muscle-strengthening exercises on 2 or more days a week.  Maintain a healthy weight. The body mass index (BMI) is a screening tool to identify possible weight problems. It provides an estimate of body fat based on height and weight. Your health care provider can find your BMI and can help you achieve or maintain a healthy weight.For adults 20 years and older:  A BMI below 18.5 is considered  underweight.  A BMI of 18.5 to 24.9 is normal.  A BMI of 25 to 29.9 is considered overweight.  A BMI of 30 and above is considered obese.  Maintain normal blood lipids and cholesterol levels by exercising and minimizing your intake of saturated fat. Eat a balanced diet with plenty of fruit and vegetables. If your lipid or cholesterol levels are high, you are over 50, or you are at high risk for heart disease, you may need your cholesterol levels checked more frequently.Ongoing high lipid and cholesterol levels should be treated with medicines if diet and exercise are not working.  If you smoke, find out from your health care provider how to quit. If you do not use tobacco, do not start.  Lung cancer screening is recommended for adults aged 77-80 years who are at high risk for developing lung cancer because of a history of smoking. A yearly low-dose CT scan of the lungs is recommended for people who have at least a 30-pack-year history of smoking and are a current smoker or have quit within the past 15 years. A pack year of smoking is smoking an average of 1 pack of cigarettes a day for 1 year (for example: 1 pack a day for 30 years or 2 packs a day for 15 years). Yearly screening should continue until the smoker has stopped smoking for at least 15 years. Yearly screening should be stopped for people who develop a health problem that would prevent them from having lung cancer treatment.  Avoid use of street drugs. Do not share needles with anyone. Ask for help if  you need support or instructions about stopping the use of drugs.  High blood pressure causes heart disease and increases the risk of stroke.  Ongoing high blood pressure should be treated with medicines if weight loss and exercise do not work.  If you are 37-35 years old, ask your health care provider if you should take aspirin to prevent strokes.  Diabetes screening involves taking a blood sample to check your fasting blood sugar  level. This should be done once every 3 years, after age 31, if you are within normal weight and without risk factors for diabetes. Testing should be considered at a younger age or be carried out more frequently if you are overweight and have at least 1 risk factor for diabetes.  Breast cancer screening is essential preventive care for women. You should practice "breast self-awareness." This means understanding the normal appearance and feel of your breasts and may include breast self-examination. Any changes detected, no matter how small, should be reported to a health care provider. Women in their 33s and 30s should have a clinical breast exam (CBE) by a health care provider as part of a regular health exam every 1 to 3 years. After age 28, women should have a CBE every year. Starting at age 6, women should consider having a mammogram (breast X-ray test) every year. Women who have a family history of breast cancer should talk to their health care provider about genetic screening. Women at a high risk of breast cancer should talk to their health care providers about having an MRI and a mammogram every year.  Breast cancer gene (BRCA)-related cancer risk assessment is recommended for women who have family members with BRCA-related cancers. BRCA-related cancers include breast, ovarian, tubal, and peritoneal cancers. Having family members with these cancers may be associated with an increased risk for harmful changes (mutations) in the breast cancer genes BRCA1 and BRCA2. Results of the assessment will determine the need for genetic counseling and BRCA1 and BRCA2 testing.  Routine pelvic exams to screen for cancer are no longer recommended for nonpregnant women who are considered low risk for cancer of the pelvic organs (ovaries, uterus, and vagina) and who do not have symptoms. Ask your health care provider if a screening pelvic exam is right for you.  If you have had past treatment for cervical cancer or a  condition that could lead to cancer, you need Pap tests and screening for cancer for at least 20 years after your treatment. If Pap tests have been discontinued, your risk factors (such as having a new sexual partner) need to be reassessed to determine if screening should be resumed. Some women have medical problems that increase the chance of getting cervical cancer. In these cases, your health care provider may recommend more frequent screening and Pap tests.    Colorectal cancer can be detected and often prevented. Most routine colorectal cancer screening begins at the age of 60 years and continues through age 65 years. However, your health care provider may recommend screening at an earlier age if you have risk factors for colon cancer. On a yearly basis, your health care provider may provide home test kits to check for hidden blood in the stool. Use of a small camera at the end of a tube, to directly examine the colon (sigmoidoscopy or colonoscopy), can detect the earliest forms of colorectal cancer. Talk to your health care provider about this at age 72, when routine screening begins. Direct exam of the colon should be  repeated every 5-10 years through age 50 years, unless early forms of pre-cancerous polyps or small growths are found.  Osteoporosis is a disease in which the bones lose minerals and strength with aging. This can result in serious bone fractures or breaks. The risk of osteoporosis can be identified using a bone density scan. Women ages 53 years and over and women at risk for fractures or osteoporosis should discuss screening with their health care providers. Ask your health care provider whether you should take a calcium supplement or vitamin D to reduce the rate of osteoporosis.  Menopause can be associated with physical symptoms and risks. Hormone replacement therapy is available to decrease symptoms and risks. You should talk to your health care provider about whether hormone  replacement therapy is right for you.  Use sunscreen. Apply sunscreen liberally and repeatedly throughout the day. You should seek shade when your shadow is shorter than you. Protect yourself by wearing long sleeves, pants, a wide-brimmed hat, and sunglasses year round, whenever you are outdoors.  Once a month, do a whole body skin exam, using a mirror to look at the skin on your back. Tell your health care provider of new moles, moles that have irregular borders, moles that are larger than a pencil eraser, or moles that have changed in shape or color.  Stay current with required vaccines (immunizations).  Influenza vaccine. All adults should be immunized every year.  Tetanus, diphtheria, and acellular pertussis (Td, Tdap) vaccine. Pregnant women should receive 1 dose of Tdap vaccine during each pregnancy. The dose should be obtained regardless of the length of time since the last dose. Immunization is preferred during the 27th-36th week of gestation. An adult who has not previously received Tdap or who does not know her vaccine status should receive 1 dose of Tdap. This initial dose should be followed by tetanus and diphtheria toxoids (Td) booster doses every 10 years. Adults with an unknown or incomplete history of completing a 3-dose immunization series with Td-containing vaccines should begin or complete a primary immunization series including a Tdap dose. Adults should receive a Td booster every 10 years.    Zoster vaccine. One dose is recommended for adults aged 73 years or older unless certain conditions are present.    Pneumococcal 13-valent conjugate (PCV13) vaccine. When indicated, a person who is uncertain of her immunization history and has no record of immunization should receive the PCV13 vaccine. An adult aged 101 years or older who has certain medical conditions and has not been previously immunized should receive 1 dose of PCV13 vaccine. This PCV13 should be followed with a dose of  pneumococcal polysaccharide (PPSV23) vaccine. The PPSV23 vaccine dose should be obtained at least 8 weeks after the dose of PCV13 vaccine. An adult aged 96 years or older who has certain medical conditions and previously received 1 or more doses of PPSV23 vaccine should receive 1 dose of PCV13. The PCV13 vaccine dose should be obtained 1 or more years after the last PPSV23 vaccine dose.    Pneumococcal polysaccharide (PPSV23) vaccine. When PCV13 is also indicated, PCV13 should be obtained first. All adults aged 3 years and older should be immunized. An adult younger than age 16 years who has certain medical conditions should be immunized. Any person who resides in a nursing home or long-term care facility should be immunized. An adult smoker should be immunized. People with an immunocompromised condition and certain other conditions should receive both PCV13 and PPSV23 vaccines. People with human immunodeficiency  virus (HIV) infection should be immunized as soon as possible after diagnosis. Immunization during chemotherapy or radiation therapy should be avoided. Routine use of PPSV23 vaccine is not recommended for American Indians, [place], or people younger than 65 years unless there are medical conditions that require PPSV23 vaccine. When indicated, people who have unknown immunization and have no record of immunization should receive PPSV23 vaccine. One-time revaccination 5 years after the first dose of PPSV23 is recommended for people aged 19-64 years who have chronic kidney failure, nephrotic syndrome, asplenia, or immunocompromised conditions. People who received 1-2 doses of PPSV23 before age 21 years should receive another dose of PPSV23 vaccine at age 38 years or later if at least 5 years have passed since the previous dose. Doses of PPSV23 are not needed for people immunized with PPSV23 at or after age 44 years.   Preventive Services / Frequency  Ages 27 years and over  Blood pressure  check.  Lipid and cholesterol check.  Lung cancer screening. / Every year if you are aged 13-80 years and have a 30-pack-year history of smoking and currently smoke or have quit within the past 15 years. Yearly screening is stopped once you have quit smoking for at least 15 years or develop a health problem that would prevent you from having lung cancer treatment.  Clinical breast exam.** / Every year after age 69 years.  BRCA-related cancer risk assessment.** / For women who have family members with a BRCA-related cancer (breast, ovarian, tubal, or peritoneal cancers).  Mammogram.** / Every year beginning at age 70 years and continuing for as long as you are in good health. Consult with your health care provider.  Pap test.** / Every 3 years starting at age 74 years through age 94 or 59 years with 3 consecutive normal Pap tests. Testing can be stopped between 65 and 70 years with 3 consecutive normal Pap tests and no abnormal Pap or HPV tests in the past 10 years.  Fecal occult blood test (FOBT) of stool. / Every year beginning at age 49 years and continuing until age 65 years. You may not need to do this test if you get a colonoscopy every 10 years.  Flexible sigmoidoscopy or colonoscopy.** / Every 5 years for a flexible sigmoidoscopy or every 10 years for a colonoscopy beginning at age 42 years and continuing until age 71 years.  Hepatitis C blood test.** / For all people born from [birth year] through 1967 and any individual with known risks for hepatitis C.  Osteoporosis screening.** / A one-time screening for women ages 79 years and over and women at risk for fractures or osteoporosis.  Skin self-exam. / Monthly.  Influenza vaccine. / Every year.  Tetanus, diphtheria, and acellular pertussis (Tdap/Td) vaccine.** / 1 dose of Td every 10 years.  Zoster vaccine.** / 1 dose for adults aged 46 years or older.  Pneumococcal 13-valent conjugate (PCV13) vaccine.** / Consult your health care  provider.  Pneumococcal polysaccharide (PPSV23) vaccine.** / 1 dose for all adults aged 68 years and older. Screening for abdominal aortic aneurysm (AAA)  by ultrasound is recommended for people who have history of high blood pressure or who are current or former smokers.

## 2014-09-06 ENCOUNTER — Encounter: Payer: Self-pay | Admitting: Physician Assistant

## 2014-09-06 LAB — MICROALBUMIN / CREATININE URINE RATIO
Creatinine, Urine: 212.7 mg/dL
Microalb Creat Ratio: 7.5 mg/g (ref 0.0–30.0)
Microalb, Ur: 1.6 mg/dL (ref <2.0)

## 2014-09-06 LAB — URINALYSIS, ROUTINE W REFLEX MICROSCOPIC
BILIRUBIN URINE: NEGATIVE
GLUCOSE, UA: NEGATIVE mg/dL
HGB URINE DIPSTICK: NEGATIVE
KETONES UR: 40 mg/dL — AB
Leukocytes, UA: NEGATIVE
NITRITE: NEGATIVE
PH: 5 (ref 5.0–8.0)
PROTEIN: NEGATIVE mg/dL
Specific Gravity, Urine: 1.023 (ref 1.005–1.030)
Urobilinogen, UA: 0.2 mg/dL (ref 0.0–1.0)

## 2014-09-06 LAB — TSH: TSH: 0.298 u[IU]/mL — AB (ref 0.350–4.500)

## 2014-09-06 LAB — VITAMIN D 25 HYDROXY (VIT D DEFICIENCY, FRACTURES): VIT D 25 HYDROXY: 35 ng/mL (ref 30–100)

## 2014-09-07 DIAGNOSIS — D485 Neoplasm of uncertain behavior of skin: Secondary | ICD-10-CM | POA: Diagnosis not present

## 2014-09-07 DIAGNOSIS — L821 Other seborrheic keratosis: Secondary | ICD-10-CM | POA: Diagnosis not present

## 2014-09-07 DIAGNOSIS — L820 Inflamed seborrheic keratosis: Secondary | ICD-10-CM | POA: Diagnosis not present

## 2014-11-04 DIAGNOSIS — Z1231 Encounter for screening mammogram for malignant neoplasm of breast: Secondary | ICD-10-CM | POA: Diagnosis not present

## 2014-11-08 DIAGNOSIS — H25013 Cortical age-related cataract, bilateral: Secondary | ICD-10-CM | POA: Diagnosis not present

## 2014-11-08 DIAGNOSIS — H04121 Dry eye syndrome of right lacrimal gland: Secondary | ICD-10-CM | POA: Diagnosis not present

## 2014-11-08 DIAGNOSIS — H2513 Age-related nuclear cataract, bilateral: Secondary | ICD-10-CM | POA: Diagnosis not present

## 2014-11-08 DIAGNOSIS — H25043 Posterior subcapsular polar age-related cataract, bilateral: Secondary | ICD-10-CM | POA: Diagnosis not present

## 2014-11-09 DIAGNOSIS — N6489 Other specified disorders of breast: Secondary | ICD-10-CM | POA: Diagnosis not present

## 2014-11-14 ENCOUNTER — Other Ambulatory Visit: Payer: Self-pay | Admitting: Internal Medicine

## 2014-11-15 ENCOUNTER — Other Ambulatory Visit: Payer: Self-pay | Admitting: Physician Assistant

## 2014-11-15 MED ORDER — LEVOTHYROXINE SODIUM 100 MCG PO TABS: ORAL_TABLET | ORAL | Status: DC

## 2014-11-19 DIAGNOSIS — Z23 Encounter for immunization: Secondary | ICD-10-CM | POA: Diagnosis not present

## 2014-11-21 ENCOUNTER — Other Ambulatory Visit: Payer: Self-pay | Admitting: Internal Medicine

## 2014-11-21 NOTE — Telephone Encounter (Signed)
Called Rx in

## 2014-11-29 ENCOUNTER — Encounter: Payer: Self-pay | Admitting: Physician Assistant

## 2014-11-29 ENCOUNTER — Ambulatory Visit (INDEPENDENT_AMBULATORY_CARE_PROVIDER_SITE_OTHER): Payer: Medicare Other | Admitting: Physician Assistant

## 2014-11-29 VITALS — BP 128/80 | HR 84 | Temp 97.7°F | Resp 16 | Ht 65.5 in | Wt 184.6 lb

## 2014-11-29 DIAGNOSIS — Z0000 Encounter for general adult medical examination without abnormal findings: Secondary | ICD-10-CM | POA: Diagnosis not present

## 2014-11-29 DIAGNOSIS — E039 Hypothyroidism, unspecified: Secondary | ICD-10-CM | POA: Diagnosis not present

## 2014-11-29 DIAGNOSIS — E559 Vitamin D deficiency, unspecified: Secondary | ICD-10-CM | POA: Diagnosis not present

## 2014-11-29 LAB — TSH: TSH: 0.841 u[IU]/mL (ref 0.350–4.500)

## 2014-11-29 NOTE — Patient Instructions (Signed)
Your ears and sinuses are connected by the eustachian tube. When your sinuses are inflamed, this can close off the tube and cause fluid to collect in your middle ear. This can then cause dizziness, popping, clicking, ringing, and echoing in your ears. This is often NOT an infection and does NOT require antibiotics, it is caused by inflammation so the treatments help the inflammation. This can take a long time to get better so please be patient.  Here are things you can do to help with this: - Try the Flonase or Nasonex. Remember to spray each nostril twice towards the outer part of your eye.  Do not sniff but instead pinch your nose and tilt your head back to help the medicine get into your sinuses.  The best time to do this is at bedtime.Stop if you get blurred vision or nose bleeds.  -While drinking fluids, pinch and hold nose close and swallow, to help open eustachian tubes to drain fluid behind ear drums. -Please pick one of the over the counter allergy medications below and take it once daily for allergies.  It will also help with fluid behind ear drums. Claritin or loratadine cheapest but likely the weakest  Zyrtec or certizine at night because it can make you sleepy The strongest is allegra or fexafinadine  Cheapest at [employer], [employer], [employer] -can use decongestant over the counter, please do not use if you have high blood pressure or certain heart conditions.   if worsening HA, changes vision/speech, imbalance, weakness go to the ER   

## 2014-11-29 NOTE — Progress Notes (Signed)
Assessment and Plan:  1. Hypertension -Continue medication, monitor blood pressure at home. Continue DASH diet.  Reminder to go to the ER if any CP, SOB, nausea, dizziness, severe HA, changes vision/speech, left arm numbness and tingling and jaw pain.  2. .Effusion bilateral ears Allergy pill, nasonex, autoinflation, call if not better  3. Hypothyroidism -check TSH level, continue medications the same, reminded to take on an empty stomach 30-27mins before food.   4. Vitamin D Def - check level and continue medications.   Future Appointments Date Time Provider [place]  08/03/2015 8:30 AM Regina Eck, CNM [place] None  09/11/2015 2:00 PM Vicie Mutters, PA-C [id number] None    Continue diet and meds as discussed. Further disposition pending results of labs. Over 30 minutes of exam, counseling, chart review, and critical decision making was performed  HPI 64 y.o. female  presents for 3 month follow up on hypertension, cholesterol, prediabetes, and vitamin D deficiency.   Her blood pressure has been controlled at home, today their BP is BP: 128/80 mmHg  She does workout. She denies chest pain, shortness of breath, dizziness.  She is not on cholesterol medication and denies myalgias. Her cholesterol is at goal. The cholesterol last visit was:   Lab Results  Component Value Date   CHOL 181 09/05/2014   HDL 58 09/05/2014   LDLCALC 108* 09/05/2014   TRIG 74 09/05/2014   CHOLHDL 3.1 09/05/2014     Last A1C in the office was:  Lab Results  Component Value Date   HGBA1C 5.2 08/05/2013   Patient is on Vitamin D supplement, 2000 IU daily.    Lab Results  Component Value Date   VD25OH 35 09/05/2014    She is on thyroid medication, she is on 100 daily but 1.5 on sunday. Her medication was changed last visit from 1.5 3 days a week to 1 day a week.    Lab Results  Component Value Date   TSH 0.298* 09/05/2014  .    Current Medications:  Current Outpatient Prescriptions  on File Prior to Visit  Medication Sig Dispense Refill  . ALPRAZolam (XANAX) 0.25 MG tablet take 1/2 to 1 tablet by mouth three times a day if needed for anxiety 90 tablet 1  . Calcium Carbonate-Vit D-Min (CALCIUM 600 + MINERALS PO) Take by mouth daily.    . Cholecalciferol (VITAMIN D) 2000 UNITS tablet Take 2,000 Units by mouth daily.    . fexofenadine (ALLEGRA) 60 MG tablet Take 60 mg by mouth daily as needed for allergies or rhinitis.    Marland Kitchen levothyroxine (SYNTHROID) 100 MCG tablet TAKE 1 AND 1/2 TABLETS BY MOUTH SUNDAY, THEN 1 TABLET REST OF THE WEEK 110 tablet 1  . mometasone (NASONEX) 50 MCG/ACT nasal spray Place 2 sprays into the nose daily. 17 g 2  . Wheat Dextrin (BENEFIBER) POWD Take by mouth 2 (two) times daily.     No current facility-administered medications on file prior to visit.   Medical History:  Past Medical History  Diagnosis Date  . Anxiety   . Hypothyroid   . Neutropenia   . Allergy   . Hypertension   . Diverticulitis 2004   Allergies: No Known Allergies   Review of Systems:  Review of Systems  Constitutional: Negative.   HENT: Negative.   Eyes: Negative.   Respiratory: Negative.  Negative for cough.   Cardiovascular: Negative.  Negative for chest pain.  Gastrointestinal: Negative.   Genitourinary: Negative.   Musculoskeletal: Positive for myalgias  and joint pain. Negative for back pain, falls and neck pain.  Skin: Negative.   Neurological: Negative.   Endo/Heme/Allergies: Negative.   Psychiatric/Behavioral: Negative.  Negative for depression and memory loss. The patient is not nervous/anxious and does not have insomnia.     Family history- Review and unchanged Social history- Review and unchanged Physical Exam: BP 128/80 mmHg  Pulse 84  Temp(Src) 97.7 F (36.5 C)  Resp 16  Ht 5' 5.5" (1.664 m)  Wt 184 lb 9.6 oz (83.734 kg)  BMI 30.24 kg/m2  LMP 03/11/2000 Wt Readings from Last 3 Encounters:  11/29/14 184 lb 9.6 oz (83.734 kg)  09/05/14 190 lb  (86.183 kg)  07/26/14 207 lb (93.895 kg)   General Appearance: Well nourished, in no apparent distress. Eyes: PERRLA, EOMs, conjunctiva no swelling or erythema Sinuses: No Frontal/maxillary tenderness ENT/Mouth: Ext aud canals clear, TMs without erythema, bulging. No erythema, swelling, or exudate on post pharynx.  Tonsils not swollen or erythematous. Hearing normal.  Neck: Supple, thyroid normal.  Respiratory: Respiratory effort normal, BS equal bilaterally without rales, rhonchi, wheezing or stridor.  Cardio: RRR with no MRGs. Brisk peripheral pulses without edema.  Abdomen: Soft, + BS,  Non tender, no guarding, rebound, hernias, masses. Lymphatics: Non tender without lymphadenopathy.  Musculoskeletal: Full ROM, 5/5 strength, Normal gait Skin: Warm, dry without rashes, lesions, ecchymosis.  Neuro: Cranial nerves intact. Normal muscle tone, no cerebellar symptoms. Psych: Awake and oriented X 3, normal affect, Insight and Judgment appropriate.    Vicie Mutters, PA-C 11:18 AM [facility]

## 2014-11-30 LAB — VITAMIN D 25 HYDROXY (VIT D DEFICIENCY, FRACTURES): Vit D, 25-Hydroxy: 36 ng/mL (ref 30–100)

## 2014-12-21 ENCOUNTER — Encounter: Payer: Self-pay | Admitting: Physician Assistant

## 2014-12-21 ENCOUNTER — Ambulatory Visit (INDEPENDENT_AMBULATORY_CARE_PROVIDER_SITE_OTHER): Payer: Medicare Other | Admitting: Physician Assistant

## 2014-12-21 VITALS — BP 110/80 | HR 102 | Temp 97.3°F | Resp 16 | Ht 65.5 in | Wt 183.0 lb

## 2014-12-21 DIAGNOSIS — E039 Hypothyroidism, unspecified: Secondary | ICD-10-CM

## 2014-12-21 DIAGNOSIS — Z1159 Encounter for screening for other viral diseases: Secondary | ICD-10-CM | POA: Diagnosis not present

## 2014-12-21 LAB — HEPATITIS C ANTIBODY: HCV AB: NEGATIVE

## 2014-12-21 NOTE — Patient Instructions (Signed)
Your thyroid is odd, it has a negative feed back system so if one goes up the other goes down.   Please just take 1 pill daily of your thyroid medication.   Lab Results  Component Value Date   TSH 0.841 11/29/2014   TSH 0.298* 09/05/2014   TSH 0.618 07/29/2014   TSH 1.643 03/17/2014   TSH 0.457 08/05/2013   TSH 3.719 03/25/2013     The lower your number it is closer to HYPERTHYROIDISM (anything below 0.350) The higher your number the close the HYPOTHYROIDISM (anything above 4.5)  Hypothyroidism Hypothyroidism is a disorder of the thyroid. The thyroid is a large gland that is located in the lower front of the neck. The thyroid releases hormones that control how the body works. With hypothyroidism, the thyroid does not make enough of these hormones. CAUSES Causes of hypothyroidism may include:  Viral infections.  Pregnancy.  Your own defense system (immune system) attacking your thyroid.  Certain medicines.  Birth defects.  Past radiation treatments to your head or neck.  Past treatment with radioactive iodine.  Past surgical removal of part or all of your thyroid.  Problems with the gland that is located in the center of your brain (pituitary). SIGNS AND SYMPTOMS Signs and symptoms of hypothyroidism may include:  Feeling as though you have no energy (lethargy).  Inability to tolerate cold.  Weight gain that is not explained by a change in diet or exercise habits.  Dry skin.  Coarse hair.  Menstrual irregularity.  Slowing of thought processes.  Constipation.  Sadness or depression. DIAGNOSIS  Your health care provider may diagnose hypothyroidism with blood tests and ultrasound tests. TREATMENT Hypothyroidism is treated with medicine that replaces the hormones that your body does not make. After you begin treatment, it may take several weeks for symptoms to go away. HOME CARE INSTRUCTIONS   Take medicines only as directed by your health care  provider.  If you start taking any new medicines, tell your health care provider.  Keep all follow-up visits as directed by your health care provider. This is important. As your condition improves, your dosage needs may change. You will need to have blood tests regularly so that your health care provider can watch your condition. SEEK MEDICAL CARE IF:  Your symptoms do not get better with treatment.  You are taking thyroid replacement medicine and:  You sweat excessively.  You have tremors.  You feel anxious.  You lose weight rapidly.  You cannot tolerate heat.  You have emotional swings.  You have diarrhea.  You feel weak. SEEK IMMEDIATE MEDICAL CARE IF:   You develop chest pain.  You develop an irregular heartbeat.  You develop a rapid heartbeat.   This information is not intended to replace advice given to you by your health care provider. Make sure you discuss any questions you have with your health care provider.   Document Released: 02/25/2005 Document Revised: 03/18/2014 Document Reviewed: 07/13/2013 Elsevier Interactive Patient Education 2016 [employer].   Hyperthyroidism Hyperthyroidism is when the thyroid is too active (overactive). Your thyroid is a large gland that is located in your neck. The thyroid helps to control how your body uses food (metabolism). When your thyroid is overactive, it produces too much of a hormone called thyroxine.  CAUSES Causes of hyperthyroidism may include:  Graves disease. This is when your immune system attacks the thyroid gland. This is the most common cause.  Inflammation of the thyroid gland.  Tumor in the thyroid  gland or somewhere else.  Excessive use of thyroid medicines, including:  Prescription thyroid supplement.  Herbal supplements that mimic thyroid hormones.  Solid or fluid-filled lumps within your thyroid gland (thyroid nodules).  Excessive ingestion of iodine. RISK FACTORS  Being female.  Having  a family history of thyroid conditions. SIGNS AND SYMPTOMS Signs and symptoms of hyperthyroidism may include:  Nervousness.  Inability to tolerate heat.  Unexplained weight loss.  Diarrhea.  Change in the texture of hair or skin.  Heart skipping beats or making extra beats.  Rapid heart rate.  Loss of menstruation.  Shaky hands.  Fatigue.  Restlessness.  Increased appetite.  Sleep problems.  Enlarged thyroid gland or nodules. DIAGNOSIS  Diagnosis of hyperthyroidism may include:  Medical history and physical exam.  Blood tests.  Ultrasound tests. TREATMENT Treatment may include:  Medicines to control your thyroid.  Surgery to remove your thyroid.  Radiation therapy. HOME CARE INSTRUCTIONS   Take medicines only as directed by your health care provider.  Do not use any tobacco products, including cigarettes, chewing tobacco, or electronic cigarettes. If you need help quitting, ask your health care provider.  Do not exercise or do physical activity until your health care provider approves.  Keep all follow-up appointments as directed by your health care provider. This is important. SEEK MEDICAL CARE IF:  Your symptoms do not get better with treatment.  You have fever.  You are taking thyroid replacement medicine and you:  Have depression.  Feel mentally and physically slow.  Have weight gain. SEEK IMMEDIATE MEDICAL CARE IF:   You have decreased alertness or a change in your awareness.  You have abdominal pain.  You feel dizzy.  You have a rapid heartbeat.  You have an irregular heartbeat.   This information is not intended to replace advice given to you by your health care provider. Make sure you discuss any questions you have with your health care provider.   Document Released: 02/25/2005 Document Revised: 03/18/2014 Document Reviewed: 07/13/2013 Elsevier Interactive Patient Education [employer].

## 2014-12-21 NOTE — Progress Notes (Signed)
Subjective:    Patient ID: Kimberly Bowers, female    DOB: [birth year], 64 y.o.   MRN: [id number]  HPI 64 y.o. with history of hypothyroidism. She has been diagnosed since her 30's. Recently she has lost weight with earheart healthy weight loss, and had hyperthyroidism, thyroid medication was cut to 1 pill daily and Sunday she is on 1.5 on Sunday with the most recent thyroid lab of 0,841. She has some mild palpitations, increase anxiety. She is also doing another round of earheart weight loss and is concerned about her thyroid dose.   Blood pressure 110/80, pulse 102, temperature 97.3 F (36.3 C), temperature source Temporal, resp. rate 16, height 5' 5.5" (1.664 m), weight 183 lb (83.008 kg), last menstrual period 03/11/2000, SpO2 94 %.  Current Outpatient Prescriptions on File Prior to Visit  Medication Sig Dispense Refill  . ALPRAZolam (XANAX) 0.25 MG tablet take 1/2 to 1 tablet by mouth three times a day if needed for anxiety 90 tablet 1  . Calcium Carbonate-Vit D-Min (CALCIUM 600 + MINERALS PO) Take by mouth daily.    . Cholecalciferol (VITAMIN D) 2000 UNITS tablet Take 2,000 Units by mouth daily.    . fexofenadine (ALLEGRA) 60 MG tablet Take 60 mg by mouth daily as needed for allergies or rhinitis.    Marland Kitchen levothyroxine (SYNTHROID) 100 MCG tablet TAKE 1 AND 1/2 TABLETS BY MOUTH SUNDAY, THEN 1 TABLET REST OF THE WEEK 110 tablet 1  . mometasone (NASONEX) 50 MCG/ACT nasal spray Place 2 sprays into the nose daily. 17 g 2  . Wheat Dextrin (BENEFIBER) POWD Take by mouth 2 (two) times daily.     No current facility-administered medications on file prior to visit.   Past Medical History  Diagnosis Date  . Anxiety   . Hypothyroid   . Neutropenia ([place])   . Allergy   . Hypertension   . Diverticulitis 2004   Review of Systems  Constitutional: Negative.   HENT: Negative.   Respiratory: Negative.   Cardiovascular: Positive for palpitations. Negative for chest pain and leg swelling.   Gastrointestinal: Negative.   Genitourinary: Negative.   Musculoskeletal: Negative.   Skin: Negative.   Neurological: Negative.   Hematological: Negative.   Psychiatric/Behavioral: Negative for suicidal ideas, hallucinations, behavioral problems, confusion, sleep disturbance, self-injury, dysphoric mood, decreased concentration and agitation. The patient is nervous/anxious. The patient is not hyperactive.        Objective:   Physical Exam  Constitutional: She is oriented to person, place, and time. She appears well-developed and well-nourished.  HENT:  Head: Normocephalic and atraumatic.  Right Ear: External ear normal.  Left Ear: External ear normal.  Mouth/Throat: Oropharynx is clear and moist.  Eyes: Conjunctivae and EOM are normal. Pupils are equal, round, and reactive to light.  Neck: Normal range of motion. Neck supple. No thyromegaly present.  Cardiovascular: Normal rate, regular rhythm and normal heart sounds.  Exam reveals no gallop and no friction rub.   No murmur heard. Pulmonary/Chest: Effort normal and breath sounds normal. No respiratory distress. She has no wheezes.  Abdominal: Soft. Bowel sounds are normal. She exhibits no distension and no mass. There is no tenderness. There is no rebound and no guarding.  Musculoskeletal: Normal range of motion.  Lymphadenopathy:    She has no cervical adenopathy.  Neurological: She is alert and oriented to person, place, and time. She displays normal reflexes. No cranial nerve deficit. Coordination normal.  Skin: Skin is warm and dry.  Psychiatric: She has a  normal mood and affect.       Assessment & Plan:  1. Hypothyroidism, unspecified hypothyroidism type Discussed thyroid process, will recheck TSH with weight loss in 2-3 months labs only Go to 1 pill daily - TSH  2. Screening for viral disease - Hepatitis C antibody   Future Appointments Date Time Provider [place]  08/03/2015 8:30 AM Regina Eck, CNM  [facility] None  09/11/2015 2:00 PM Vicie Mutters, PA-C [id number] None

## 2014-12-22 LAB — TSH: TSH: 1.105 u[IU]/mL (ref 0.350–4.500)

## 2015-03-16 DIAGNOSIS — Z85828 Personal history of other malignant neoplasm of skin: Secondary | ICD-10-CM | POA: Diagnosis not present

## 2015-03-16 DIAGNOSIS — L821 Other seborrheic keratosis: Secondary | ICD-10-CM | POA: Diagnosis not present

## 2015-03-16 DIAGNOSIS — Z23 Encounter for immunization: Secondary | ICD-10-CM | POA: Diagnosis not present

## 2015-03-16 DIAGNOSIS — D225 Melanocytic nevi of trunk: Secondary | ICD-10-CM | POA: Diagnosis not present

## 2015-04-24 DIAGNOSIS — Z780 Asymptomatic menopausal state: Secondary | ICD-10-CM | POA: Diagnosis not present

## 2015-04-26 ENCOUNTER — Telehealth: Payer: Self-pay

## 2015-04-26 NOTE — Telephone Encounter (Signed)
Called patient to give BMD results. Results to be scanned in once patient is notified. [email or web address]

## 2015-04-27 NOTE — Telephone Encounter (Signed)
Patient notified of results as written by provider 

## 2015-04-27 NOTE — Telephone Encounter (Signed)
Patient returning call.

## 2015-05-17 DIAGNOSIS — L720 Epidermal cyst: Secondary | ICD-10-CM | POA: Diagnosis not present

## 2015-05-17 DIAGNOSIS — Z23 Encounter for immunization: Secondary | ICD-10-CM | POA: Diagnosis not present

## 2015-05-17 DIAGNOSIS — L570 Actinic keratosis: Secondary | ICD-10-CM | POA: Diagnosis not present

## 2015-06-13 ENCOUNTER — Other Ambulatory Visit: Payer: Self-pay | Admitting: Physician Assistant

## 2015-07-01 ENCOUNTER — Encounter: Payer: Self-pay | Admitting: *Deleted

## 2015-07-03 DIAGNOSIS — L570 Actinic keratosis: Secondary | ICD-10-CM | POA: Diagnosis not present

## 2015-07-03 DIAGNOSIS — L821 Other seborrheic keratosis: Secondary | ICD-10-CM | POA: Diagnosis not present

## 2015-07-14 ENCOUNTER — Other Ambulatory Visit: Payer: Self-pay | Admitting: Physician Assistant

## 2015-08-03 ENCOUNTER — Ambulatory Visit (INDEPENDENT_AMBULATORY_CARE_PROVIDER_SITE_OTHER): Payer: Medicare Other | Admitting: Certified Nurse Midwife

## 2015-08-03 ENCOUNTER — Encounter: Payer: Self-pay | Admitting: Certified Nurse Midwife

## 2015-08-03 VITALS — BP 118/80 | HR 72 | Resp 16 | Ht 65.5 in | Wt 161.0 lb

## 2015-08-03 DIAGNOSIS — N952 Postmenopausal atrophic vaginitis: Secondary | ICD-10-CM

## 2015-08-03 DIAGNOSIS — Z124 Encounter for screening for malignant neoplasm of cervix: Secondary | ICD-10-CM

## 2015-08-03 DIAGNOSIS — Z01419 Encounter for gynecological examination (general) (routine) without abnormal findings: Secondary | ICD-10-CM

## 2015-08-03 NOTE — Progress Notes (Signed)
Reviewed personally.  M. Suzanne Yaremi Stahlman, MD.  

## 2015-08-03 NOTE — Patient Instructions (Signed)

## 2015-08-03 NOTE — Progress Notes (Signed)
65 y.o. [id number] Married  Caucasian Fe here for annual exam. Menopausal no HRT. Denies vaginal bleeding or vaginal dryness.  Has recently retired and very happy to be home with spouse. Spouse being treated for bladder cancer stage 0, doing well. Sees PCP for hypothyroid management/labs and aex. No health concerns today. Had second look with mammogram due to asymetry, all normal. Planning to spend time with grandchildren now.  Patient's last menstrual period was 03/11/2000.          Sexually active: Yes.    The current method of family planning is post menopausal status.    Exercising: Yes.    dance, walking, weights Smoker:  no  Health Maintenance: Pap: 07-22-13 neg HPV HR neg MMG:  11-04-14 bilateral f/u left breast 11-09-14 neg Colonoscopy:  2009 f/u 50yrs BMD:   2017 TDaP:  2014 Shingles: 2014 Pneumonia: 2016 Hep C and HIV: hep done neg 2016 Labs: pcp Self breast exam: done monthly   reports that she has never smoked. She has never used smokeless tobacco. She reports that she drinks about 1.2 - 1.8 oz of alcohol per week. She reports that she does not use illicit drugs.  Past Medical History  Diagnosis Date  . Anxiety   . Hypothyroid   . Neutropenia ([place])   . Allergy   . Hypertension   . Diverticulitis 2004    Past Surgical History  Procedure Laterality Date  . Laparoscopic gastric banding  2004  . Knee arthroscopy Left   . Uterine fibroid surgery  2001  . Foot surgery Right     benign cyst  . Tubal ligation      age 62  . Laparoscopic repair and removal of gastric band      8/14    Current Outpatient Prescriptions  Medication Sig Dispense Refill  . B Complex Vitamins (B COMPLEX PO) Take by mouth.    . Calcium Carbonate-Vit D-Min (CALCIUM 600 + MINERALS PO) Take by mouth daily.    Marland Kitchen CALCIUM PO Take 600 mg by mouth daily.    . Cholecalciferol (VITAMIN D) 2000 UNITS tablet Take 2,000 Units by mouth daily.    . fexofenadine (ALLEGRA) 60 MG tablet Take 60 mg by mouth  daily as needed for allergies or rhinitis.    . mometasone (NASONEX) 50 MCG/ACT nasal spray instill 2 sprays into each nostril once daily 17 g 3  . SYNTHROID 100 MCG tablet take 1 and 1/2 tablets by mouth ON SUNDAY then 1 tablet daily THE REST OF THE WEEK. (Patient taking differently: take 1 daily) 110 tablet 0  . Wheat Dextrin (BENEFIBER) POWD Take by mouth 2 (two) times daily.     No current facility-administered medications for this visit.    Family History  Problem Relation Age of Onset  . Cancer Mother     lung  . Diabetes Father     ROS:  Pertinent items are noted in HPI.  Otherwise, a comprehensive ROS was negative.  Exam:   BP 118/80 mmHg  Pulse 72  Resp 16  Ht 5' 5.5" (1.664 m)  Wt 161 lb (73.029 kg)  BMI 26.37 kg/m2  LMP 03/11/2000 Height: 5' 5.5" (166.4 cm) Ht Readings from Last 3 Encounters:  08/03/15 5' 5.5" (1.664 m)  12/21/14 5' 5.5" (1.664 m)  11/29/14 5' 5.5" (1.664 m)    General appearance: alert, cooperative and appears stated age Head: Normocephalic, without obvious abnormality, atraumatic Neck: no adenopathy, supple, symmetrical, trachea midline and thyroid normal to inspection  and palpation Lungs: clear to auscultation bilaterally Breasts: normal appearance, no masses or tenderness, No nipple retraction or dimpling, No nipple discharge or bleeding, No axillary or supraclavicular adenopathy Heart: regular rate and rhythm Abdomen: soft, non-tender; no masses,  no organomegaly Extremities: extremities normal, atraumatic, no cyanosis or edema Skin: Skin color, texture, turgor normal. No rashes or lesions Lymph nodes: Cervical, supraclavicular, and axillary nodes normal. No abnormal inguinal nodes palpated Neurologic: Grossly normal   Pelvic: External genitalia:  no lesions              Urethra:  normal appearing urethra with no masses, tenderness or lesions              Bartholin's and Skene's: normal                 Vagina: atrophic appearing vagina  with normal color and scant discharge, no lesions              Cervix: normal, non tender.no lesions              Pap taken: Yes.   Bimanual Exam:  Uterus:  normal size, contour, position, consistency, mobility, non-tender              Adnexa: normal adnexa and no mass, fullness, tenderness               Rectovaginal: Confirms               Anus:  normal sphincter tone, no lesions  Chaperone present: yes  A:  Well Woman with normal exam  Menopausal no HRT  Hypothyroid with PCP management  Atrophic vaginitis  P:   Reviewed health and wellness pertinent to exam  Aware of need to advise if vaginal bleeding  Continue follow up as recommended  Discussed finding, etiology and treatment option of OTC or estrogen products. Prefers OTC. Coconut oil recommended with instructions to use nightly, prn. Advise if problems. Questions addressed.  Reviewed BMD and mammogram with patient, questions addressed.  Pap smear as above with HPV reflex   counseled on breast self exam, mammography screening, menopause, adequate intake of calcium and vitamin D, diet and exercise  return annually or prn  An After Visit Summary was printed and given to the patient.

## 2015-08-04 LAB — IPS PAP TEST WITH REFLEX TO HPV

## 2015-08-17 ENCOUNTER — Other Ambulatory Visit: Payer: Self-pay | Admitting: Physician Assistant

## 2015-08-17 NOTE — Telephone Encounter (Signed)
Rx called in to [facility]

## 2015-09-11 ENCOUNTER — Encounter: Payer: Self-pay | Admitting: Physician Assistant

## 2015-09-19 ENCOUNTER — Encounter: Payer: Self-pay | Admitting: Physician Assistant

## 2015-10-11 ENCOUNTER — Other Ambulatory Visit: Payer: Self-pay | Admitting: Internal Medicine

## 2015-10-18 ENCOUNTER — Ambulatory Visit: Payer: Self-pay | Admitting: Physician Assistant

## 2015-10-18 DIAGNOSIS — H02411 Mechanical ptosis of right eyelid: Secondary | ICD-10-CM | POA: Diagnosis not present

## 2015-11-02 ENCOUNTER — Encounter: Payer: Self-pay | Admitting: Physician Assistant

## 2015-11-02 ENCOUNTER — Ambulatory Visit (INDEPENDENT_AMBULATORY_CARE_PROVIDER_SITE_OTHER): Payer: Medicare Other | Admitting: Physician Assistant

## 2015-11-02 VITALS — BP 120/80 | HR 86 | Temp 97.7°F | Resp 16 | Ht 66.0 in | Wt 155.8 lb

## 2015-11-02 DIAGNOSIS — Z1389 Encounter for screening for other disorder: Secondary | ICD-10-CM

## 2015-11-02 DIAGNOSIS — Z23 Encounter for immunization: Secondary | ICD-10-CM

## 2015-11-02 DIAGNOSIS — Z139 Encounter for screening, unspecified: Secondary | ICD-10-CM | POA: Diagnosis not present

## 2015-11-02 DIAGNOSIS — R6889 Other general symptoms and signs: Secondary | ICD-10-CM

## 2015-11-02 DIAGNOSIS — E782 Mixed hyperlipidemia: Secondary | ICD-10-CM

## 2015-11-02 DIAGNOSIS — E039 Hypothyroidism, unspecified: Secondary | ICD-10-CM | POA: Diagnosis not present

## 2015-11-02 DIAGNOSIS — E559 Vitamin D deficiency, unspecified: Secondary | ICD-10-CM

## 2015-11-02 DIAGNOSIS — Z79899 Other long term (current) drug therapy: Secondary | ICD-10-CM | POA: Diagnosis not present

## 2015-11-02 DIAGNOSIS — E669 Obesity, unspecified: Secondary | ICD-10-CM

## 2015-11-02 DIAGNOSIS — D709 Neutropenia, unspecified: Secondary | ICD-10-CM

## 2015-11-02 DIAGNOSIS — F419 Anxiety disorder, unspecified: Secondary | ICD-10-CM

## 2015-11-02 DIAGNOSIS — Z0001 Encounter for general adult medical examination with abnormal findings: Secondary | ICD-10-CM

## 2015-11-02 DIAGNOSIS — T7840XD Allergy, unspecified, subsequent encounter: Secondary | ICD-10-CM

## 2015-11-02 DIAGNOSIS — Z0000 Encounter for general adult medical examination without abnormal findings: Secondary | ICD-10-CM

## 2015-11-02 LAB — CBC WITH DIFFERENTIAL/PLATELET
Basophils Absolute: 35 cells/uL (ref 0–200)
Basophils Relative: 1 %
EOS PCT: 3 %
Eosinophils Absolute: 105 cells/uL (ref 15–500)
HCT: 41.9 % (ref 35.0–45.0)
Hemoglobin: 14.1 g/dL (ref 11.7–15.5)
LYMPHS PCT: 27 %
Lymphs Abs: 945 cells/uL (ref 850–3900)
MCH: 32.9 pg (ref 27.0–33.0)
MCHC: 33.7 g/dL (ref 32.0–36.0)
MCV: 97.9 fL (ref 80.0–100.0)
MPV: 9.7 fL (ref 7.5–12.5)
Monocytes Absolute: 315 cells/uL (ref 200–950)
Monocytes Relative: 9 %
NEUTROS PCT: 60 %
Neutro Abs: 2100 cells/uL (ref 1500–7800)
PLATELETS: 267 10*3/uL (ref 140–400)
RBC: 4.28 MIL/uL (ref 3.80–5.10)
RDW: 13.9 % (ref 11.0–15.0)
WBC: 3.5 10*3/uL — AB (ref 3.8–10.8)

## 2015-11-02 LAB — TSH: TSH: 1.86 mIU/L

## 2015-11-02 LAB — HEPATIC FUNCTION PANEL
ALT: 19 U/L (ref 6–29)
AST: 23 U/L (ref 10–35)
Albumin: 4.2 g/dL (ref 3.6–5.1)
Alkaline Phosphatase: 44 U/L (ref 33–130)
BILIRUBIN INDIRECT: 0.5 mg/dL (ref 0.2–1.2)
BILIRUBIN TOTAL: 0.6 mg/dL (ref 0.2–1.2)
Bilirubin, Direct: 0.1 mg/dL (ref <=0.2)
TOTAL PROTEIN: 6.5 g/dL (ref 6.1–8.1)

## 2015-11-02 LAB — BASIC METABOLIC PANEL WITH GFR
BUN: 19 mg/dL (ref 7–25)
CALCIUM: 9.3 mg/dL (ref 8.6–10.4)
CO2: 27 mmol/L (ref 20–31)
Chloride: 104 mmol/L (ref 98–110)
Creat: 0.84 mg/dL (ref 0.50–0.99)
GFR, EST AFRICAN AMERICAN: 83 mL/min (ref >=60)
GFR, EST NON AFRICAN AMERICAN: 72 mL/min (ref >=60)
Glucose, Bld: 87 mg/dL (ref 65–99)
Potassium: 4.1 mmol/L (ref 3.5–5.3)
SODIUM: 141 mmol/L (ref 135–146)

## 2015-11-02 LAB — LIPID PANEL
CHOLESTEROL: 215 mg/dL — AB (ref 125–200)
HDL: 93 mg/dL (ref >=46)
LDL Cholesterol: 110 mg/dL (ref <130)
TRIGLYCERIDES: 59 mg/dL (ref <150)
Total CHOL/HDL Ratio: 2.3 Ratio (ref <=5.0)
VLDL: 12 mg/dL (ref <30)

## 2015-11-02 LAB — MAGNESIUM: Magnesium: 2.2 mg/dL (ref 1.5–2.5)

## 2015-11-02 NOTE — Progress Notes (Signed)
MEDICARE VISIT AND FOLLOW UP  Assessment:   1. Hypothyroidism, unspecified hypothyroidism type Hypothyroidism-check TSH level, continue medications the same, reminded to take on an empty stomach 30-71mins before food.  - TSH  2. Mixed hyperlipidemia -continue medications, check lipids, decrease fatty foods, increase activity.  - CBC with Differential - BASIC METABOLIC PANEL WITH GFR - Hepatic function panel - Lipid panel  3. Obesity Obesity with co morbidities- long discussion about weight loss, diet, and exercise  4. Vitamin D deficiency Continue supplement  5. Medication management - Magnesium  6. Anxiety  stress management techniques discussed, increase water, good sleep hygiene discussed, increase exercise, and increase veggies.   7. Neutropenia Check CBC  8. Allergy, subsequent encounter Allergic rhinitis- Allegra OTC, increase H20, allergy hygiene explained.  9. Insomnia Decrease liquids before bed, can try melatnonin or 1/2 0.25mg  xanax if needed.    Plan:   During the course of the visit the patient was educated and counseled about appropriate screening and preventive services including:    Pneumococcal vaccine   Influenza vaccine  Td vaccine  Screening electrocardiogram  Screening mammography  Bone densitometry screening  Colorectal cancer screening  Diabetes screening  Glaucoma screening  Nutrition counseling   Advanced directives: given info/requested  Subjective:   Kimberly Bowers is a 65 y.o. female who presents for Medicare Visit and 3 month follow up on hypertension, hypothyroidism, obesity, hyperlipidemia, vitamin D def.   Her blood pressure has been controlled at home, BP: 120/80  She does workout, doing dance at cultural center, [place], 2 x a week, doing weight now. She denies chest pain, shortness of breath, dizziness.  She is not on cholesterol medication and denies myalgias. Her cholesterol is at goal. The cholesterol last visit  was:   Lab Results  Component Value Date   CHOL 181 09/05/2014   HDL 58 09/05/2014   LDLCALC 108 (H) 09/05/2014   TRIG 74 09/05/2014   CHOLHDL 3.1 09/05/2014   Last A1C in the office was:  Lab Results  Component Value Date   HGBA1C 5.2 08/05/2013   Patient is on Vitamin D supplement.   Has history of diverticulitis, has increased fiber.  She has been having issues with sleeping, no issues with falling asleep She is on thyroid medication. Her medication was changed last visit. She is on 122mcg every day.  Lab Results  Component Value Date   TSH 1.105 12/21/2014   BMI is Body mass index is 25.15 kg/m., she is working on diet and exercise and has done a wonderful job of weight loss by incorp. Wt Readings from Last 3 Encounters:  11/02/15 155 lb 12.8 oz (70.7 kg)  08/03/15 161 lb (73 kg)  12/21/14 183 lb (83 kg)    Names of Other Physician/Practitioners you currently use: 1. [facility]- here for primary care 2. Dr. Talbert Forest, eye doctor, last visit Sept 2015 3. Dr. Orvil Feil, dentist, last visit 6 months ago Patient Care Team: Vicie Mutters, PA-C as PCP - General (Physician Assistant) Sable Feil, MD as Consulting Physician (Gastroenterology) Elsie Saas, MD as Consulting Physician (Orthopedic Surgery) Megan Salon, MD as Consulting Physician (Gynecology)   Medication Review Current Outpatient Prescriptions on File Prior to Visit  Medication Sig Dispense Refill  . ALPRAZolam (XANAX) 0.25 MG tablet take 1/2 to 1 tablet by mouth three times a day if needed for anxiety 90 tablet 1  . B Complex Vitamins (B COMPLEX PO) Take by mouth.    Marland Kitchen  Calcium Carbonate-Vit D-Min (CALCIUM 600 + MINERALS PO) Take by mouth daily.    Marland Kitchen CALCIUM PO Take 600 mg by mouth daily.    . Cholecalciferol (VITAMIN D) 2000 UNITS tablet Take 2,000 Units by mouth daily.    . fexofenadine (ALLEGRA) 60 MG tablet Take 60 mg by mouth daily as needed for allergies or  rhinitis.    . mometasone (NASONEX) 50 MCG/ACT nasal spray instill 2 sprays into each nostril once daily 17 g 3  . SYNTHROID 100 MCG tablet take 1 and 1/2 tablets by mouth ON SUNDAY THEN 1 TABLET DAILY THE REST OF THE WEEK 110 tablet 0  . Wheat Dextrin (BENEFIBER) POWD Take by mouth 2 (two) times daily.     No current facility-administered medications on file prior to visit.     Current Problems (verified) Patient Active Problem List   Diagnosis Date Noted  . Encounter for Medicare annual wellness exam 11/29/2014  . Obesity 03/17/2014  . Vitamin D deficiency 03/17/2014  . Medication management 03/17/2014  . Mixed hyperlipidemia 03/25/2013  . Anxiety   . Hypothyroid   . Neutropenia ([place])   . Allergy     Screening Tests Immunization History  Administered Date(s) Administered  . Influenza Split 12/08/2013  . Influenza, High Dose Seasonal PF 11/19/2014  . Influenza-Unspecified 12/29/2012  . Pneumococcal Conjugate-13 09/05/2014  . Td 03/11/2004  . Tdap 07/08/2012  . Zoster 07/08/2012    Preventative care: Last colonoscopy: 2009 due 2019 EGD: 2009 Last mammogram: 10/2014 Last pap smear/pelvic exam: 07/2015 DEXA:04/2015 CXR 2006  Prior vaccinations: TD or Tdap: 2014  Influenza: 2016 DUE Pneumococcal: DUE Prevnar13: 2016 Shingles/Zostavax: 2014  History reviewed: allergies, current medications, past family history, past medical history, past social history, past surgical history and problem list  Allergies No Known Allergies  SURGICAL HISTORY She  has a past surgical history that includes Laparoscopic gastric banding (2004); Knee arthroscopy (Left); Uterine fibroid surgery (2001); Foot surgery (Right); Tubal ligation; and Laparoscopic repair and removal of gastric band. FAMILY HISTORY Her family history includes Cancer in her mother; Diabetes in her father. SOCIAL HISTORY She  reports that she has never smoked. She has never used smokeless tobacco. She reports that  she drinks about 1.2 - 1.8 oz of alcohol per week . She reports that she does not use drugs.  MEDICARE WELLNESS OBJECTIVES: Physical activity: Current Exercise Habits: Structured exercise class, Type of exercise: strength training/weights;Other - see comments, Time (Minutes): 30, Frequency (Times/Week): 6, Weekly Exercise (Minutes/Week): 180, Intensity: Mild Cardiac risk factors:   Depression/mood screen:   Depression screen [facility] 2/9 11/02/2015  Decreased Interest 0  Down, Depressed, Hopeless 0  PHQ - 2 Score 0    ADLs:  In your present state of health, do you have any difficulty performing the following activities: 11/02/2015  Hearing? N  Vision? N  Difficulty concentrating or making decisions? N  Walking or climbing stairs? N  Dressing or bathing? N  Doing errands, shopping? N  Preparing Food and eating ? N  Using the Toilet? N  In the past six months, have you accidently leaked urine? N  Do you have problems with loss of bowel control? N  Managing your Medications? N  Managing your Finances? N  Housekeeping or managing your Housekeeping? N  Some recent data might be hidden    Cognitive Testing  Alert? Yes  Normal Appearance?Yes  Oriented to person? Yes  Place? Yes   Time? Yes  Recall of three objects?  Yes  Can perform simple calculations? Yes  Displays appropriate judgment?Yes  Can read the correct time from a watch face?Yes  EOL planning: Does patient have an advance directive?: Yes Type of Advance Directive: Healthcare Power of Attorney, Living will Does patient want to make changes to advanced directive?: No - Patient declined Copy of advanced directive(s) in chart?: No - copy requested  Review of Systems:  Review of Systems  Constitutional: Negative.   HENT: Negative.   Eyes: Negative.   Respiratory: Negative.   Cardiovascular: Negative.   Gastrointestinal: Negative.   Genitourinary: Negative.   Musculoskeletal: Negative.   Skin: Negative.   Neurological:  Negative.   Endo/Heme/Allergies: Negative.   Psychiatric/Behavioral: The patient has insomnia.     Objective:   Today's Vitals   11/02/15 0911  BP: 120/80  Pulse: 86  Resp: 16  Temp: 97.7 F (36.5 C)  SpO2: 98%  Weight: 155 lb 12.8 oz (70.7 kg)  Height: 5\' 6"  (1.676 m)    General appearance: alert, no distress, WD/WN,  female HEENT: normocephalic, sclerae anicteric, TMs pearly, nares patent, no discharge or erythema, pharynx normal Oral cavity: MMM, no lesions Neck: supple, no lymphadenopathy, no thyromegaly, no masses Heart: RRR, normal S1, S2, no murmurs Lungs: CTA bilaterally, no wheezes, rhonchi, or rales Abdomen: +bs, soft, non tender, non distended, no masses, no hepatomegaly, no splenomegaly Musculoskeletal: nontender, no swelling, no obvious deformity Extremities: no edema, no cyanosis, no clubbing Pulses: 2+ symmetric, upper and lower extremities, normal cap refill Neurological: alert, oriented x 3, CN2-12 intact, strength normal upper extremities and lower extremities, sensation normal throughout, DTRs 2+ throughout, no cerebellar signs, gait normal Psychiatric: normal affect, behavior normal, pleasant  Breast: defer Gyn: defer Rectal: defer  Medicare Attestation I have personally reviewed: The patient's medical and social history Their use of alcohol, tobacco or illicit drugs Their current medications and supplements The patient's functional ability including ADLs,fall risks, home safety risks, cognitive, and hearing and visual impairment Diet and physical activities Evidence for depression or mood disorders  The patient's weight, height, BMI, and visual acuity have been recorded in the chart.  I have made referrals, counseling, and provided education to the patient based on review of the above and I have provided the patient with a written personalized care plan for preventive services.     Vicie Mutters, PA-C   11/02/2015   v

## 2015-11-03 LAB — URINALYSIS, ROUTINE W REFLEX MICROSCOPIC
Bilirubin Urine: NEGATIVE
Glucose, UA: NEGATIVE
HGB URINE DIPSTICK: NEGATIVE
Ketones, ur: NEGATIVE
LEUKOCYTES UA: NEGATIVE
NITRITE: NEGATIVE
Protein, ur: NEGATIVE
SPECIFIC GRAVITY, URINE: 1.024 (ref 1.001–1.035)
pH: 5 (ref 5.0–8.0)

## 2015-11-03 LAB — MICROALBUMIN / CREATININE URINE RATIO
Creatinine, Urine: 177 mg/dL (ref 20–320)
MICROALB UR: 0.6 mg/dL
MICROALB/CREAT RATIO: 3 ug/mg{creat} (ref <30)

## 2015-11-03 LAB — VITAMIN D 25 HYDROXY (VIT D DEFICIENCY, FRACTURES): VIT D 25 HYDROXY: 47 ng/mL (ref 30–100)

## 2015-11-08 DIAGNOSIS — Z1231 Encounter for screening mammogram for malignant neoplasm of breast: Secondary | ICD-10-CM | POA: Diagnosis not present

## 2015-11-09 DIAGNOSIS — H2513 Age-related nuclear cataract, bilateral: Secondary | ICD-10-CM | POA: Diagnosis not present

## 2015-11-09 DIAGNOSIS — H25013 Cortical age-related cataract, bilateral: Secondary | ICD-10-CM | POA: Diagnosis not present

## 2015-11-09 DIAGNOSIS — H04121 Dry eye syndrome of right lacrimal gland: Secondary | ICD-10-CM | POA: Diagnosis not present

## 2015-11-09 DIAGNOSIS — H25043 Posterior subcapsular polar age-related cataract, bilateral: Secondary | ICD-10-CM | POA: Diagnosis not present

## 2015-11-14 ENCOUNTER — Other Ambulatory Visit: Payer: Self-pay

## 2016-01-25 ENCOUNTER — Other Ambulatory Visit: Payer: Self-pay | Admitting: Physician Assistant

## 2016-02-07 ENCOUNTER — Encounter: Payer: Self-pay | Admitting: Certified Nurse Midwife

## 2016-02-07 ENCOUNTER — Ambulatory Visit (INDEPENDENT_AMBULATORY_CARE_PROVIDER_SITE_OTHER): Payer: Medicare Other | Admitting: Certified Nurse Midwife

## 2016-02-07 VITALS — BP 106/70 | HR 72 | Resp 16 | Ht 65.5 in | Wt 165.0 lb

## 2016-02-07 DIAGNOSIS — A6009 Herpesviral infection of other urogenital tract: Secondary | ICD-10-CM

## 2016-02-07 MED ORDER — VALACYCLOVIR HCL 1 G PO TABS: 1000.0000 mg | ORAL_TABLET | Freq: Two times a day (BID) | ORAL | 0 refills | Status: DC

## 2016-02-07 NOTE — Patient Instructions (Signed)

## 2016-02-07 NOTE — Progress Notes (Signed)
65 y.o. Married Caucasian female [id number] here with complaint of symptoms of itching, burning, and rash on left vulva that started about 3-4 days ago Patient experienced nerve pain and burning in area and some pelvic external pelvic tenderness. Now very uncomfortable and noted blisterlike area on left vulva. Has been putting steroid cream on area with some relief.Marland Kitchen Describes discharge as normal. Some external itching, no vaginal symptoms. No history of HSV or spouse with HSV. Spouse being treated for cancer, so no sexual activity. No history of "fever blisters".   Denies new personal products or vaginal dryness. no STD concerns. Urinary symptoms none . Menopausal.   O:Healthy female WDWN Affect: normal, orientation x 3  Exam:Skin: warm and dry Abdomen:soft, non tender Inguinal Lymph nodes bilateral:slight  enlargement and tenderness noted Pelvic exam: External genital: normal female, Vulva on left only small blister like papules noted in center of vulva and out into groin area, some drying, some with scant clear fluid noted, tender to touch, culture obtained, wet prep taken BUS: negative Vagina: scant discharge noted. 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 Discussed Aveeno  sitz bath for comfort. Discussed transmission and need for good hand washing when wiping area. Discussed symptom treatment with Valtrex, which should help with nerve pain. Patient agreeable. Will call with culture results. Can apply hydrocortisone OTC topically to area for comfort as drying. Questions addressed at length. Lab: HSV culture Rx: Valtrex see order with instructions  Re check in 3 weeks to discuss  Rv prn

## 2016-02-08 NOTE — Progress Notes (Signed)
Encounter reviewed Murdis Flitton, MD   

## 2016-02-09 LAB — HERPES SIMPLEX VIRUS CULTURE: ORGANISM ID, BACTERIA: NOT DETECTED

## 2016-02-13 ENCOUNTER — Other Ambulatory Visit (INDEPENDENT_AMBULATORY_CARE_PROVIDER_SITE_OTHER): Payer: Medicare Other

## 2016-02-13 ENCOUNTER — Telehealth: Payer: Self-pay | Admitting: Certified Nurse Midwife

## 2016-02-13 ENCOUNTER — Other Ambulatory Visit: Payer: Self-pay | Admitting: Certified Nurse Midwife

## 2016-02-13 DIAGNOSIS — B009 Herpesviral infection, unspecified: Secondary | ICD-10-CM

## 2016-02-13 NOTE — Telephone Encounter (Signed)
Patient called regarding HSV culture results negative, yet once she started the Valtrex her nerve pain resolved. History that she remembered was shingles in her 46's. Wondered if this could have been that.  Not in same area as before which was her back. Discussed with patient obtaining HSv culture difficult when drying, but could have been shingles. Suggest blood draw for antibody level if present. Patient would like to have this done as spouse tested negative with PCP. Order placed and patient to schedule lab appointment.

## 2016-02-14 LAB — HSV(HERPES SIMPLEX VRS) I + II AB-IGG: HSV 1 Glycoprotein G Ab, IgG: <0.9 Index (ref <0.90)

## 2016-02-28 ENCOUNTER — Ambulatory Visit: Payer: Medicare Other | Admitting: Certified Nurse Midwife

## 2016-03-11 HISTORY — PX: BRACHIOPLASTY: SUR162

## 2016-03-16 ENCOUNTER — Encounter: Payer: Self-pay | Admitting: *Deleted

## 2016-05-06 ENCOUNTER — Ambulatory Visit: Payer: Self-pay | Admitting: Physician Assistant

## 2016-05-14 ENCOUNTER — Other Ambulatory Visit: Payer: Self-pay | Admitting: Physician Assistant

## 2016-05-16 ENCOUNTER — Ambulatory Visit: Payer: Self-pay | Admitting: Physician Assistant

## 2016-05-23 ENCOUNTER — Ambulatory Visit (INDEPENDENT_AMBULATORY_CARE_PROVIDER_SITE_OTHER): Payer: Self-pay | Admitting: Physician Assistant

## 2016-05-23 ENCOUNTER — Encounter: Payer: Self-pay | Admitting: Physician Assistant

## 2016-05-23 VITALS — BP 124/76 | HR 82 | Temp 97.7°F | Resp 16 | Ht 65.5 in | Wt 170.6 lb

## 2016-05-23 DIAGNOSIS — D709 Neutropenia, unspecified: Secondary | ICD-10-CM

## 2016-05-23 DIAGNOSIS — F419 Anxiety disorder, unspecified: Secondary | ICD-10-CM

## 2016-05-23 DIAGNOSIS — Z79899 Other long term (current) drug therapy: Secondary | ICD-10-CM

## 2016-05-23 DIAGNOSIS — E782 Mixed hyperlipidemia: Secondary | ICD-10-CM

## 2016-05-23 DIAGNOSIS — T7840XD Allergy, unspecified, subsequent encounter: Secondary | ICD-10-CM

## 2016-05-23 DIAGNOSIS — E559 Vitamin D deficiency, unspecified: Secondary | ICD-10-CM

## 2016-05-23 DIAGNOSIS — E039 Hypothyroidism, unspecified: Secondary | ICD-10-CM

## 2016-05-23 NOTE — Progress Notes (Signed)
Assessment and Plan:  Declines visit, no issues at this time Will see back for wellness once yearly per patient request  Future Appointments Date Time Provider [place]  08/07/2016 8:30 AM Regina Eck, CNM [place] None  11/06/2016 9:00 AM Vicie Mutters, PA-C [facility] None    Continue diet and meds as discussed. Further disposition pending results of labs. Over 30 minutes of exam, counseling, chart review, and critical decision making was performed  HPI 66 y.o. female  presents for 6 month follow up on hypertension, cholesterol, prediabetes, and vitamin D deficiency.   Her blood pressure has been controlled at home, today their BP is BP: 124/76  She does workout. She denies chest pain, shortness of breath, dizziness. Only on xanax when she travels or for sleep.   She is not on cholesterol medication and denies myalgias. Her cholesterol is at goal. The cholesterol last visit was:   Lab Results  Component Value Date   CHOL 215 (H) 11/02/2015   HDL 93 11/02/2015   LDLCALC 110 11/02/2015   TRIG 59 11/02/2015   CHOLHDL 2.3 11/02/2015     Last A1C in the office was:  Lab Results  Component Value Date   HGBA1C 5.2 08/05/2013   Patient is on Vitamin D supplement, 2000 IU daily.    Lab Results  Component Value Date   VD25OH 59 11/02/2015    She is on thyroid medication, she is on 100 daily but 1.5 on sunday. Her medication was changed last visit from 1.5 3 days a week to 1 day a week.    Lab Results  Component Value Date   TSH 1.86 11/02/2015  .  BMI is Body mass index is 27.96 kg/m., she is working on diet and exercise. Wt Readings from Last 3 Encounters:  05/23/16 170 lb 9.6 oz (77.4 kg)  02/07/16 165 lb (74.8 kg)  11/02/15 155 lb 12.8 oz (70.7 kg)    Current Medications:  Current Outpatient Prescriptions on File Prior to Visit  Medication Sig Dispense Refill  . ALPRAZolam (XANAX) 0.25 MG tablet take 1/2 to 1 tablet by mouth three times a day if needed for  anxiety 90 tablet 1  . B Complex Vitamins (B COMPLEX PO) Take by mouth.    . Cholecalciferol (VITAMIN D) 2000 UNITS tablet Take 2,000 Units by mouth daily.    . fexofenadine (ALLEGRA) 60 MG tablet Take 60 mg by mouth daily as needed for allergies or rhinitis.    . mometasone (NASONEX) 50 MCG/ACT nasal spray instill 2 sprays into each nostril once daily 17 g 3  . SYNTHROID 100 MCG tablet take 1 and 1/2 tablets by mouth ON SUNDAY THEN 1 TABLET DAILY THE REST OF THE WEEK 110 tablet 0  . Wheat Dextrin (BENEFIBER) POWD Take by mouth 2 (two) times daily.     No current facility-administered medications on file prior to visit.    Medical History:  Past Medical History:  Diagnosis Date  . Allergy   . Anxiety   . Diverticulitis 2004  . Hypertension   . Hypothyroid   . Neutropenia ([facility])    Allergies: No Known Allergies   Review of Systems:  Review of Systems  Constitutional: Negative.   HENT: Negative.   Eyes: Negative.   Respiratory: Negative.  Negative for cough.   Cardiovascular: Negative.  Negative for chest pain.  Gastrointestinal: Negative.   Genitourinary: Negative.   Musculoskeletal: Positive for joint pain and myalgias. Negative for back pain, falls and neck  pain.  Skin: Negative.   Neurological: Negative.   Endo/Heme/Allergies: Negative.   Psychiatric/Behavioral: Negative.  Negative for depression and memory loss. The patient is not nervous/anxious and does not have insomnia.     Family history- Review and unchanged Social history- Review and unchanged Physical Exam: BP 124/76   Pulse 82   Temp 97.7 F (36.5 C)   Resp 16   Ht 5' 5.5" (1.664 m)   Wt 170 lb 9.6 oz (77.4 kg)   LMP 03/11/2000   SpO2 96%   BMI 27.96 kg/m  Wt Readings from Last 3 Encounters:  05/23/16 170 lb 9.6 oz (77.4 kg)  02/07/16 165 lb (74.8 kg)  11/02/15 155 lb 12.8 oz (70.7 kg)   General Appearance: Well nourished, in no apparent distress. Eyes: PERRLA, EOMs, conjunctiva no swelling or  erythema Sinuses: No Frontal/maxillary tenderness ENT/Mouth: Ext aud canals clear, TMs without erythema, bulging. No erythema, swelling, or exudate on post pharynx.  Tonsils not swollen or erythematous. Hearing normal.  Neck: Supple, thyroid normal.  Respiratory: Respiratory effort normal, BS equal bilaterally without rales, rhonchi, wheezing or stridor.  Cardio: RRR with no MRGs. Brisk peripheral pulses without edema.  Abdomen: Soft, + BS,  Non tender, no guarding, rebound, hernias, masses. Lymphatics: Non tender without lymphadenopathy.  Musculoskeletal: Full ROM, 5/5 strength, Normal gait Skin: Warm, dry without rashes, lesions, ecchymosis.  Neuro: Cranial nerves intact. Normal muscle tone, no cerebellar symptoms. Psych: Awake and oriented X 3, normal affect, Insight and Judgment appropriate.    Vicie Mutters, PA-C 11:15 AM [facility]

## 2016-07-03 ENCOUNTER — Encounter: Payer: Self-pay | Admitting: Physician Assistant

## 2016-07-03 ENCOUNTER — Ambulatory Visit (INDEPENDENT_AMBULATORY_CARE_PROVIDER_SITE_OTHER): Payer: Medicare Other | Admitting: Physician Assistant

## 2016-07-03 VITALS — BP 132/80 | HR 86 | Temp 97.3°F | Resp 14 | Ht 65.5 in | Wt 163.0 lb

## 2016-07-03 DIAGNOSIS — T7840XD Allergy, unspecified, subsequent encounter: Secondary | ICD-10-CM | POA: Diagnosis not present

## 2016-07-03 MED ORDER — AZITHROMYCIN 250 MG PO TABS: ORAL_TABLET | ORAL | 1 refills | Status: AC

## 2016-07-03 MED ORDER — PREDNISONE 20 MG PO TABS: ORAL_TABLET | ORAL | 0 refills | Status: DC

## 2016-07-03 NOTE — Patient Instructions (Signed)

## 2016-07-03 NOTE — Progress Notes (Signed)
Subjective:    Patient ID: Kimberly Bowers, female    DOB: 06-10-1950, 66 y.o.   MRN: [id number]  HPI 66 y.o. WF with history of neutropenia, obesity, allergies presents with cold symptoms x Friday, feeling worse, feels fever but does not, having sore throat, body aches, congestion, ear fullness, leaving town Friday.   Blood pressure 132/80, pulse 86, temperature 97.3 F (36.3 C), resp. rate 14, height 5' 5.5" (1.664 m), weight 163 lb (73.9 kg), last menstrual period 03/11/2000, SpO2 96 %.  Medications Current Outpatient Prescriptions on File Prior to Visit  Medication Sig  . B Complex Vitamins (B COMPLEX PO) Take by mouth.  . Cholecalciferol (VITAMIN D) 2000 UNITS tablet Take 2,000 Units by mouth daily.  . fexofenadine (ALLEGRA) 60 MG tablet Take 60 mg by mouth daily as needed for allergies or rhinitis.  . mometasone (NASONEX) 50 MCG/ACT nasal spray instill 2 sprays into each nostril once daily  . SYNTHROID 100 MCG tablet take 1 and 1/2 tablets by mouth ON SUNDAY THEN 1 TABLET DAILY THE REST OF THE WEEK  . Wheat Dextrin (BENEFIBER) POWD Take by mouth 2 (two) times daily.  Marland Kitchen ALPRAZolam (XANAX) 0.25 MG tablet take 1/2 to 1 tablet by mouth three times a day if needed for anxiety (Patient not taking: Reported on 07/03/2016)   No current facility-administered medications on file prior to visit.     Problem list She has Anxiety; Hypothyroid; Neutropenia ([place]); Allergy; Mixed hyperlipidemia; Obesity; Vitamin D deficiency; Medication management; and Encounter for Medicare annual wellness exam on her problem list.   Review of Systems  Constitutional: Negative for chills and diaphoresis.  HENT: Positive for congestion, postnasal drip, sinus pressure and sneezing. Negative for ear pain and sore throat.   Respiratory: Positive for cough. Negative for chest tightness, shortness of breath and wheezing.   Cardiovascular: Negative.   Gastrointestinal: Negative.   Genitourinary: Negative.    Musculoskeletal: Negative for neck pain.  Neurological: Positive for headaches.       Objective:   Physical Exam  Constitutional: She is oriented to person, place, and time. She appears well-developed and well-nourished.  HENT:  Right Ear: Hearing and external ear normal. No mastoid tenderness. Tympanic membrane is injected. Tympanic membrane is not perforated, not erythematous, not retracted and not bulging. A middle ear effusion is present.  Left Ear: Hearing and external ear normal. No mastoid tenderness. Tympanic membrane is injected. Tympanic membrane is not perforated, not erythematous, not retracted and not bulging. A middle ear effusion is present.  Nose: Right sinus exhibits maxillary sinus tenderness. Left sinus exhibits maxillary sinus tenderness.  Mouth/Throat: Uvula is midline, oropharynx is clear and moist and mucous membranes are normal.  Eyes: Conjunctivae and EOM are normal. Pupils are equal, round, and reactive to light.  Neck: Neck supple.  Cardiovascular: Normal rate and regular rhythm.   Pulmonary/Chest: Effort normal and breath sounds normal. No respiratory distress. She has no wheezes.  Abdominal: Soft. Bowel sounds are normal.  Musculoskeletal: Normal range of motion.  Lymphadenopathy:    She has no cervical adenopathy.  Neurological: She is alert and oriented to person, place, and time.  Skin: Skin is warm and dry.       Assessment & Plan:  Allergic state, subsequent encounter Will hold the zpak and take if she is not getting better, increase fluids, rest, cont allergy pill - predniSONE (DELTASONE) 20 MG tablet; 2 tablets daily for 3 days, 1 tablet daily for 4 days.  Dispense: 10 tablet;  Refill: 0 - azithromycin (ZITHROMAX) 250 MG tablet; Take 2 tablets (500 mg) on  Day 1,  followed by 1 tablet (250 mg) once daily on Days 2 through 5.  Dispense: 6 each; Refill: 1

## 2016-08-07 ENCOUNTER — Ambulatory Visit: Payer: Medicare Other | Admitting: Certified Nurse Midwife

## 2016-08-14 ENCOUNTER — Ambulatory Visit (INDEPENDENT_AMBULATORY_CARE_PROVIDER_SITE_OTHER): Payer: Medicare Other | Admitting: Certified Nurse Midwife

## 2016-08-14 ENCOUNTER — Encounter: Payer: Self-pay | Admitting: Certified Nurse Midwife

## 2016-08-14 VITALS — BP 110/68 | HR 70 | Resp 16 | Ht 65.25 in | Wt 169.0 lb

## 2016-08-14 DIAGNOSIS — N951 Menopausal and female climacteric states: Secondary | ICD-10-CM

## 2016-08-14 DIAGNOSIS — Z01419 Encounter for gynecological examination (general) (routine) without abnormal findings: Secondary | ICD-10-CM

## 2016-08-14 NOTE — Patient Instructions (Signed)
EXERCISE AND DIET:  We recommended that you start or continue a regular exercise program for good health. Regular exercise means any activity that makes your heart beat faster and makes you sweat.  We recommend exercising at least 30 minutes per day at least 3 days a week, preferably 4 or 5.  We also recommend a diet low in fat and sugar.  Inactivity, poor dietary choices and obesity can cause diabetes, heart attack, stroke, and kidney damage, among others.    ALCOHOL AND SMOKING:  Women should limit their alcohol intake to no more than 7 drinks/beers/glasses of wine (combined, not each!) per week. Moderation of alcohol intake to this level decreases your risk of breast cancer and liver damage. And of course, no recreational drugs are part of a healthy lifestyle.  And absolutely no smoking or even second hand smoke. Most people know smoking can cause heart and lung diseases, but did you know it also contributes to weakening of your bones? Aging of your skin?  Yellowing of your teeth and nails?  CALCIUM AND VITAMIN D:  Adequate intake of calcium and Vitamin D are recommended.  The recommendations for exact amounts of these supplements seem to change often, but generally speaking 600 mg of calcium (either carbonate or citrate) and 800 units of Vitamin D per day seems prudent. Certain women may benefit from higher intake of Vitamin D.  If you are among these women, your doctor will have told you during your visit.    PAP SMEARS:  Pap smears, to check for cervical cancer or precancers,  have traditionally been done yearly, although recent scientific advances have shown that most women can have pap smears less often.  However, every woman still should have a physical exam from her gynecologist every year. It will include a breast check, inspection of the vulva and vagina to check for abnormal growths or skin changes, a visual exam of the cervix, and then an exam to evaluate the size and shape of the uterus and  ovaries.  And after 68 years of age, a rectal exam is indicated to check for rectal cancers. We will also provide age appropriate advice regarding health maintenance, like when you should have certain vaccines, screening for sexually transmitted diseases, bone density testing, colonoscopy, mammograms, etc.   MAMMOGRAMS:  All women over 40 years old should have a yearly mammogram. Many facilities now offer a "3D" mammogram, which may cost around $50 extra out of pocket. If possible,  we recommend you accept the option to have the 3D mammogram performed.  It both reduces the number of women who will be called back for extra views which then turn out to be normal, and it is better than the routine mammogram at detecting truly abnormal areas.    COLONOSCOPY:  Colonoscopy to screen for colon cancer is recommended for all women at age 50.  We know, you hate the idea of the prep.  We agree, BUT, having colon cancer and not knowing it is worse!!  Colon cancer so often starts as a polyp that can be seen and removed at colonscopy, which can quite literally save your life!  And if your first colonoscopy is normal and you have no family history of colon cancer, most women don't have to have it again for 10 years.  Once every ten years, you can do something that may end up saving your life, right?  We will be happy to help you get it scheduled when you are ready.    Be sure to check your insurance coverage so you understand how much it will cost.  It may be covered as a preventative service at no cost, but you should check your particular policy.      Atrophic Vaginitis Atrophic vaginitis is when the tissues that line the vagina become dry and thin. This is caused by a drop in estrogen. Estrogen helps:  To keep the vagina moist.  To make a clear fluid that helps: ? To lubricate the vagina for sex. ? To protect the vagina from infection.  If the lining of the vagina is dry and thin, it may:  Make sex painful. It  may also cause bleeding.  Cause a feeling of: ? Burning. ? Irritation. ? Itchiness.  Make an exam of your vagina painful. It may also cause bleeding.  Make you lose interest in sex.  Cause a burning feeling when you pee.  Make your vaginal fluid (discharge) brown or yellow.  For some women, there are no symptoms. This condition is most common in women who do not get their regular menstrual periods anymore (menopause). This often starts when a woman is 45-55 years old. Follow these instructions at home:  Take medicines only as told by your doctor. Do not use any herbal or alternative medicines unless your doctor says it is okay.  Use over-the-counter products for dryness only as told by your doctor. These include: ? Creams. ? Lubricants. ? Moisturizers.  Do not douche.  Do not use products that can make your vagina dry. These include: ? Scented feminine sprays. ? Scented tampons. ? Scented soaps.  If it hurts to have sex, tell your sexual partner. Contact a doctor if:  Your discharge looks different than normal.  Your vagina has an unusual smell.  You have new symptoms.  Your symptoms do not get better with treatment.  Your symptoms get worse. This information is not intended to replace advice given to you by your health care provider. Make sure you discuss any questions you have with your health care provider. Document Released: 08/14/2007 Document Revised: 08/03/2015 Document Reviewed: 02/16/2014 Elsevier Interactive Patient Education  2018 [employer] Inc.  

## 2016-08-14 NOTE — Progress Notes (Signed)
66 y.o. G31P2002 Married  Caucasian Fe here for annual exam. Denies vaginal bleeding. Using coconut oil for dryness prn. Denies any vaginal symptoms. Sees PCP for hypothyroid and anxiety management/labs and aex. All medications stable at present. Spouse in early stage of bladder cancer treatment, going well. 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 Serology and culture negative. But responded to Valtrex and resolved. Patient is aware, visual can be confirmation also. Declines any further lab work. No other health issues today. Planning trip to [place] soon( she grew up there)!  Patient's last menstrual period was 03/11/2000.          Sexually active: Yes.    The current method of family planning is tubal ligation.    Exercising: Yes.    dance/aerobic, walking, free weights Smoker:  no  Health Maintenance: Pap:  07-22-13 neg HPV HR neg, 08-03-15 neg History of Abnormal Pap: no MMG:  11-08-15 category b density birads 1:neg Self Breast exams: yes Colonoscopy:  2009 f/u 80yrs BMD:   2017 TDaP:  2014 Shingles: 2014 Pneumonia: 2017 Hep C and HIV: hep c neg 2016 Labs: none   reports that she has never smoked. She has never used smokeless tobacco. She reports that she drinks about 0.6 - 1.2 oz of alcohol per week . She reports that she does not use drugs.  Past Medical History:  Diagnosis Date  . Allergy   . Anxiety   . Diverticulitis 2004  . Hypertension   . Hypothyroid   . Neutropenia [facility])     Past Surgical History:  Procedure Laterality Date  . FOOT SURGERY Right    benign cyst  . KNEE ARTHROSCOPY Left   . LAPAROSCOPIC GASTRIC BANDING  2004  . LAPAROSCOPIC REPAIR AND REMOVAL OF GASTRIC BAND     8/14  . TUBAL LIGATION     age 89  . UTERINE FIBROID SURGERY  2001    Current Outpatient Prescriptions  Medication Sig Dispense Refill  . ALPRAZolam (XANAX) 0.25 MG tablet take 1/2 to 1 tablet by mouth three times a day if needed for anxiety 90 tablet 1  . B  Complex Vitamins (B COMPLEX PO) Take by mouth.    . Cholecalciferol (VITAMIN D) 2000 UNITS tablet Take 2,000 Units by mouth daily.    . fexofenadine (ALLEGRA) 60 MG tablet Take 60 mg by mouth daily as needed for allergies or rhinitis.    Marland Kitchen MAGNESIUM PO Take by mouth daily.    . mometasone (NASONEX) 50 MCG/ACT nasal spray instill 2 sprays into each nostril once daily 17 g 3  . SYNTHROID 100 MCG tablet take 1 and 1/2 tablets by mouth ON SUNDAY THEN 1 TABLET DAILY THE REST OF THE WEEK 110 tablet 0  . Wheat Dextrin (BENEFIBER) POWD Take by mouth 2 (two) times daily.     No current facility-administered medications for this visit.     Family History  Problem Relation Age of Onset  . Cancer Mother        lung  . Diabetes Father     ROS:  Pertinent items are noted in HPI.  Otherwise, a comprehensive ROS was negative.  Exam:   BP 110/68   Pulse 70   Resp 16   Ht 5' 5.25" (1.657 m)   Wt 169 lb (76.7 kg)   LMP 03/11/2000   BMI 27.91 kg/m  Height: 5' 5.25" (165.7 cm) Ht Readings from Last 3 Encounters:  08/14/16 5' 5.25" (1.657 m)  07/03/16 5' 5.5" (1.664 m)  05/23/16 5' 5.5" (1.664 m)    General appearance: alert, cooperative and appears stated age Head: Normocephalic, without obvious abnormality, atraumatic Neck: no adenopathy, supple, symmetrical, trachea midline and thyroid normal to inspection and palpation Lungs: clear to auscultation bilaterally Breasts: normal appearance, no masses or tenderness, No nipple retraction or dimpling, No nipple discharge or bleeding, No axillary or supraclavicular adenopathy Heart: regular rate and rhythm Abdomen: soft, non-tender; no masses,  no organomegaly Extremities: extremities normal, atraumatic, no cyanosis or edema Skin: Skin color, texture, turgor normal. No rashes or lesions Lymph nodes: Cervical, supraclavicular, and axillary nodes normal. No abnormal inguinal nodes palpated Neurologic: Grossly normal   Pelvic: External genitalia:   no lesions              Urethra:  normal appearing urethra with no masses, tenderness or lesions              Bartholin's and Skene's: normal                 Vagina: normal appearing vagina with normal color and discharge, no lesions              Cervix: no cervical motion tenderness, no lesions and normal appearance              Pap taken: No. Bimanual Exam:  Uterus:  normal size, contour, position, consistency, mobility, non-tender              Adnexa: normal adnexa and no mass, fullness, tenderness               Rectovaginal: Confirms               Anus:  normal sphincter tone, no lesions  Chaperone present: yes  A:  Well Woman with normal exam  Menopausal no HRT  Vaginal dryness using coconut oil prn with good results  No HSV outbreaks, no Rx desired due to lab findings  Hypothyroid/Anxiety with PCP management  P:   Reviewed health and wellness pertinent to exam  Aware of need to evaluate if vaginal bleeding  Continue coconut oil use and advise if problems  Advise if another occurrence for evaluation or RX.  Continue PCP follow up as indicated  Pap smear: no   counseled on breast self exam, mammography screening, adequate intake of calcium and vitamin D, diet and exercise  return annually or prn  An After Visit Summary was printed and given to the patient.

## 2016-09-06 ENCOUNTER — Other Ambulatory Visit: Payer: Self-pay | Admitting: Physician Assistant

## 2016-10-30 ENCOUNTER — Encounter: Payer: Self-pay | Admitting: Physician Assistant

## 2016-10-30 NOTE — Progress Notes (Signed)
CPE AND FOLLOW UP  Assessment:    Hypothyroidism, unspecified hypothyroidism type Hypothyroidism-check TSH level, continue medications the same, reminded to take on an empty stomach 30-73mins before food.  - TSH   Mixed hyperlipidemia -continue medications, check lipids, decrease fatty foods, increase activity.  - CBC with Differential - BASIC METABOLIC PANEL WITH GFR - Hepatic function panel - Lipid panel  Obesity Obesity with co morbidities- long discussion about weight loss, diet, and exercise  Vitamin D deficiency Continue supplement   Medication management - Magnesium   Anxiety  stress management techniques discussed, increase water, good sleep hygiene discussed, increase exercise, and increase veggies.   Neutropenia Check CBC  Allergy, subsequent encounter Allergic rhinitis- Allegra OTC, increase H20, allergy hygiene explained.   Insomnia Decrease liquids before bed, can try melatnonin or 1/2 0.25mg  xanax if needed.   Travel meds Discussed travel meds and sleeping meds for trip  Future Appointments Date Time Provider [place]  08/20/2017 9:30 AM Regina Eck, CNM [place] None  11/04/2017 9:00 AM Vicie Mutters, PA-C [facility] None     Subjective:   Kimberly Bowers is a 66 y.o. female who presents for CPE and 3 month follow up on hypertension, hypothyroidism, obesity, hyperlipidemia, vitamin D def.   Her blood pressure has been controlled at home, BP: 120/76  Going to [place] for 2 1/2 weeks, leaving next Thursday, reunion going back.  She does workout, doing dance at cultural center, [place], 2 x a week and zumba. She denies chest pain, shortness of breath, dizziness.  She is not on cholesterol medication and denies myalgias. Her cholesterol is at goal. The cholesterol last visit was:   Lab Results  Component Value Date   CHOL 215 (H) 11/02/2015   HDL 93 11/02/2015   LDLCALC 110 11/02/2015   TRIG 59 11/02/2015   CHOLHDL 2.3 11/02/2015   Last  A1C in the office was:  Lab Results  Component Value Date   HGBA1C 5.2 08/05/2013   Patient is on Vitamin D supplement.   Has history of diverticulitis, has increased fiber.  She has been having issues with sleeping, no issues with falling asleep She is on thyroid medication. Her medication was not changed last visit. She is on 160mcg every day.  Lab Results  Component Value Date   TSH 1.86 11/02/2015   BMI is Body mass index is 26.89 kg/m., she is working on diet and exercise. Wt Readings from Last 3 Encounters:  11/01/16 166 lb 9.6 oz (75.6 kg)  08/14/16 169 lb (76.7 kg)  07/03/16 163 lb (73.9 kg)    Names of Other Physician/Practitioners you currently use: 1. [facility]- here for primary care 2. Dr. Talbert Forest, eye doctor, last visit Sept 2017 has OV 3. Dr. Orvil Feil, dentist, last visit q 6 months ago Patient Care Team: Vicie Mutters, Hershal Coria as PCP - General (Physician Assistant) Sable Feil, MD as Consulting Physician (Gastroenterology) Elsie Saas, MD as Consulting Physician (Orthopedic Surgery) Megan Salon, MD as Consulting Physician (Gynecology)   Medication Review Current Outpatient Prescriptions on File Prior to Visit  Medication Sig Dispense Refill  . ALPRAZolam (XANAX) 0.25 MG tablet take 1/2 to 1 tablet by mouth three times a day if needed for anxiety 90 tablet 1  . B Complex Vitamins (B COMPLEX PO) Take by mouth.    . Cholecalciferol (VITAMIN D) 2000 UNITS tablet Take 2,000 Units by mouth daily.    . fexofenadine (ALLEGRA) 60 MG tablet Take 60 mg  by mouth daily as needed for allergies or rhinitis.    Marland Kitchen MAGNESIUM PO Take by mouth daily.    . mometasone (NASONEX) 50 MCG/ACT nasal spray instill 2 sprays into each nostril once daily 17 g 3  . SYNTHROID 100 MCG tablet take 1 and 1/2 tablets by mouth ON SUNDAY THEN 1 TABLET DAILY THE REST OF THE WEEK 110 tablet 0  . Wheat Dextrin (BENEFIBER) POWD Take by mouth 2 (two) times  daily.     No current facility-administered medications on file prior to visit.     Current Problems (verified) Patient Active Problem List   Diagnosis Date Noted  . Encounter for Medicare annual wellness exam 11/29/2014  . Obesity 03/17/2014  . Vitamin D deficiency 03/17/2014  . Medication management 03/17/2014  . Mixed hyperlipidemia 03/25/2013  . Anxiety   . Hypothyroid   . Neutropenia ([place])   . Allergy     Screening Tests Immunization History  Administered Date(s) Administered  . Influenza Split 12/08/2013  . Influenza, High Dose Seasonal PF 11/19/2014, 11/01/2016  . Influenza-Unspecified 12/29/2012  . Pneumococcal Conjugate-13 09/05/2014  . Pneumococcal Polysaccharide-23 11/02/2015  . Td 03/11/2004  . Tdap 07/08/2012  . Zoster 07/08/2012    Preventative care: Last colonoscopy: 2009 due 2019 EGD: 2009 Last mammogram: 10/2014 Last pap smear/pelvic exam: 07/2015 DEXA:04/2015 CXR 2006  Prior vaccinations: TD or Tdap: 2014  Influenza: 2017 Pneumococcal: 2017 Prevnar13: 2016 Shingles/Zostavax: 2014  History reviewed: allergies, current medications, past family history, past medical history, past social history, past surgical history and problem list  Allergies No Known Allergies  SURGICAL HISTORY She  has a past surgical history that includes Laparoscopic gastric banding (2004); Knee arthroscopy (Left); Uterine fibroid surgery (2001); Foot surgery (Right); Tubal ligation; Laparoscopic repair and removal of gastric band; and Brachioplasty (03/2016). FAMILY HISTORY Her family history includes Cancer in her mother; Diabetes in her father. SOCIAL HISTORY She  reports that she has never smoked. She has never used smokeless tobacco. She reports that she drinks about 0.6 - 1.2 oz of alcohol per week . She reports that she does not use drugs.  Review of Systems:  Review of Systems  Constitutional: Negative.   HENT: Negative.   Eyes: Negative.   Respiratory:  Negative.   Cardiovascular: Negative.   Gastrointestinal: Negative.   Genitourinary: Negative.   Musculoskeletal: Negative.   Skin: Negative.   Neurological: Negative.   Endo/Heme/Allergies: Negative.   Psychiatric/Behavioral: The patient does not have insomnia.     Objective:   Today's Vitals   11/01/16 0937  BP: 120/76  Pulse: 78  Resp: 16  Temp: 97.7 F (36.5 C)  SpO2: 98%  Weight: 166 lb 9.6 oz (75.6 kg)  Height: 5\' 6"  (1.676 m)    General appearance: alert, no distress, WD/WN,  female HEENT: normocephalic, sclerae anicteric, TMs pearly, nares patent, no discharge or erythema, pharynx normal Oral cavity: MMM, no lesions Neck: supple, no lymphadenopathy, no thyromegaly, no masses Heart: RRR, normal S1, S2, no murmurs Lungs: CTA bilaterally, no wheezes, rhonchi, or rales Abdomen: +bs, soft, non tender, non distended, no masses, no hepatomegaly, no splenomegaly Musculoskeletal: nontender, no swelling, no obvious deformity 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  Extremities: no edema, no cyanosis, no clubbing Pulses: 2+ symmetric, upper and lower extremities, normal cap refill Neurological: alert, oriented x 3, CN2-12 intact, strength normal upper extremities and lower extremities, sensation normal throughout, DTRs 2+ throughout, no cerebellar signs, gait normal Psychiatric: normal affect, behavior normal, pleasant  Breast: defer  Gyn: defer Rectal: defer   Vicie Mutters, Hershal Coria   11/02/2015

## 2016-11-01 ENCOUNTER — Encounter: Payer: Self-pay | Admitting: Physician Assistant

## 2016-11-01 ENCOUNTER — Ambulatory Visit (INDEPENDENT_AMBULATORY_CARE_PROVIDER_SITE_OTHER): Payer: Medicare Other | Admitting: Physician Assistant

## 2016-11-01 VITALS — BP 120/76 | HR 78 | Temp 97.7°F | Resp 16 | Ht 66.0 in | Wt 166.6 lb

## 2016-11-01 DIAGNOSIS — I10 Essential (primary) hypertension: Secondary | ICD-10-CM

## 2016-11-01 DIAGNOSIS — E559 Vitamin D deficiency, unspecified: Secondary | ICD-10-CM

## 2016-11-01 DIAGNOSIS — R6889 Other general symptoms and signs: Secondary | ICD-10-CM

## 2016-11-01 DIAGNOSIS — F419 Anxiety disorder, unspecified: Secondary | ICD-10-CM

## 2016-11-01 DIAGNOSIS — D709 Neutropenia, unspecified: Secondary | ICD-10-CM

## 2016-11-01 DIAGNOSIS — Z0001 Encounter for general adult medical examination with abnormal findings: Secondary | ICD-10-CM

## 2016-11-01 DIAGNOSIS — Z79899 Other long term (current) drug therapy: Secondary | ICD-10-CM

## 2016-11-01 DIAGNOSIS — Z23 Encounter for immunization: Secondary | ICD-10-CM

## 2016-11-01 DIAGNOSIS — Z136 Encounter for screening for cardiovascular disorders: Secondary | ICD-10-CM

## 2016-11-01 DIAGNOSIS — E039 Hypothyroidism, unspecified: Secondary | ICD-10-CM | POA: Diagnosis not present

## 2016-11-01 DIAGNOSIS — Z7184 Encounter for health counseling related to travel: Secondary | ICD-10-CM

## 2016-11-01 DIAGNOSIS — E782 Mixed hyperlipidemia: Secondary | ICD-10-CM

## 2016-11-01 DIAGNOSIS — Z0000 Encounter for general adult medical examination without abnormal findings: Secondary | ICD-10-CM

## 2016-11-01 DIAGNOSIS — T7840XD Allergy, unspecified, subsequent encounter: Secondary | ICD-10-CM

## 2016-11-01 DIAGNOSIS — Z1389 Encounter for screening for other disorder: Secondary | ICD-10-CM

## 2016-11-01 MED ORDER — PREDNISONE 20 MG PO TABS: ORAL_TABLET | ORAL | 0 refills | Status: DC

## 2016-11-01 MED ORDER — CYCLOBENZAPRINE HCL 10 MG PO TABS: 10.0000 mg | ORAL_TABLET | Freq: Every evening | ORAL | 0 refills | Status: DC | PRN

## 2016-11-01 MED ORDER — HYOSCYAMINE SULFATE SL 0.125 MG SL SUBL: 0.1250 mg | SUBLINGUAL_TABLET | SUBLINGUAL | 0 refills | Status: DC | PRN

## 2016-11-01 MED ORDER — ONDANSETRON HCL 4 MG PO TABS: 4.0000 mg | ORAL_TABLET | Freq: Every day | ORAL | 1 refills | Status: DC | PRN

## 2016-11-01 MED ORDER — FLUCONAZOLE 150 MG PO TABS: ORAL_TABLET | ORAL | 0 refills | Status: DC

## 2016-11-01 MED ORDER — CIPROFLOXACIN HCL 500 MG PO TABS: 500.0000 mg | ORAL_TABLET | Freq: Two times a day (BID) | ORAL | 0 refills | Status: DC

## 2016-11-01 NOTE — Patient Instructions (Signed)
  If you are traveling you can take these medications to be more prepared. If you get chest pain, shortness of breath or abdominal pain please go to the hospital wherever you may be.   Ciprofloxacin is good for travelers diarrhea, you can take 2 pills a day for 7 days. Or it is also good for urinary tract infections, you can take 2 a day for 7 days.  Zpak- is good for sinus infections please finish as prescribed Phenergran is for nausea but it sedating so plan on eating and sleeping.  Levsin is good for nausea, diarrhea, or abdominal cramping- it can constipate you so don't take too much. This dissolves under your tongue.  Prednisone is good for joint pain or rashes or spider bites- you can take it as prescribed but if you are feeling better you can stop it early.  Diflucan is for a yeast infection.   

## 2016-11-04 ENCOUNTER — Other Ambulatory Visit: Payer: Self-pay | Admitting: Physician Assistant

## 2016-11-04 ENCOUNTER — Other Ambulatory Visit: Payer: Self-pay

## 2016-11-04 MED ORDER — LEVOTHYROXINE SODIUM 100 MCG PO TABS: ORAL_TABLET | ORAL | 0 refills | Status: DC

## 2016-11-04 MED ORDER — ALPRAZOLAM 0.25 MG PO TABS: ORAL_TABLET | ORAL | 1 refills | Status: DC

## 2016-11-04 NOTE — Progress Notes (Signed)
Xanax was called into pharmacy on Aug 27th 2018 by DD

## 2016-11-06 ENCOUNTER — Encounter: Payer: Self-pay | Admitting: Physician Assistant

## 2016-11-07 LAB — BASIC METABOLIC PANEL WITH GFR
BUN: 18 mg/dL (ref 7–25)
CHLORIDE: 101 mmol/L (ref 98–110)
CO2: 26 mmol/L (ref 20–32)
CREATININE: 0.87 mg/dL (ref 0.50–0.99)
Calcium: 9.4 mg/dL (ref 8.6–10.4)
GFR, EST AFRICAN AMERICAN: 79 mL/min/{1.73_m2} (ref >=60)
GFR, Est Non African American: 68 mL/min/{1.73_m2} (ref >=60)
Glucose, Bld: 79 mg/dL (ref 65–99)
Potassium: 4 mmol/L (ref 3.5–5.3)
SODIUM: 138 mmol/L (ref 135–146)

## 2016-11-07 LAB — CBC WITH DIFFERENTIAL/PLATELET
BASOS ABS: 30 {cells}/uL (ref 0–200)
Basophils Relative: 0.8 %
EOS PCT: 3.5 %
Eosinophils Absolute: 130 cells/uL (ref 15–500)
HCT: 43.1 % (ref 35.0–45.0)
Hemoglobin: 14.6 g/dL (ref 11.7–15.5)
Lymphs Abs: 940 cells/uL (ref 850–3900)
MCH: 32.4 pg (ref 27.0–33.0)
MCHC: 33.9 g/dL (ref 32.0–36.0)
MCV: 95.8 fL (ref 80.0–100.0)
MPV: 10 fL (ref 7.5–12.5)
Monocytes Relative: 7.2 %
NEUTROS PCT: 63.1 %
Neutro Abs: 2335 cells/uL (ref 1500–7800)
PLATELETS: 273 10*3/uL (ref 140–400)
RBC: 4.5 10*6/uL (ref 3.80–5.10)
RDW: 11.9 % (ref 11.0–15.0)
TOTAL LYMPHOCYTE: 25.4 %
WBC mixed population: 266 cells/uL (ref 200–950)
WBC: 3.7 10*3/uL — AB (ref 3.8–10.8)

## 2016-11-07 LAB — LIPID PANEL
CHOL/HDL RATIO: 2.5 (calc) (ref <5.0)
Cholesterol: 226 mg/dL — ABNORMAL HIGH (ref <200)
HDL: 90 mg/dL (ref >50)
LDL CHOLESTEROL (CALC): 120 mg/dL — AB
Non-HDL Cholesterol (Calc): 136 mg/dL (calc) — ABNORMAL HIGH (ref <130)
Triglycerides: 66 mg/dL (ref <150)

## 2016-11-07 LAB — URINALYSIS, ROUTINE W REFLEX MICROSCOPIC
BILIRUBIN URINE: NEGATIVE
GLUCOSE, UA: NEGATIVE
HGB URINE DIPSTICK: NEGATIVE
Leukocytes, UA: NEGATIVE
Nitrite: NEGATIVE
PROTEIN: NEGATIVE
Specific Gravity, Urine: 1.025 (ref 1.001–1.03)
pH: <=5 (ref 5.0–8.0)

## 2016-11-07 LAB — HEPATIC FUNCTION PANEL
AG RATIO: 2 (calc) (ref 1.0–2.5)
ALT: 16 U/L (ref 6–29)
AST: 30 U/L (ref 10–35)
Albumin: 4.5 g/dL (ref 3.6–5.1)
Alkaline phosphatase (APISO): 47 U/L (ref 33–130)
BILIRUBIN DIRECT: 0.1 mg/dL (ref 0.0–0.2)
BILIRUBIN INDIRECT: 0.5 mg/dL (ref 0.2–1.2)
GLOBULIN: 2.3 g/dL (ref 1.9–3.7)
Total Bilirubin: 0.6 mg/dL (ref 0.2–1.2)
Total Protein: 6.8 g/dL (ref 6.1–8.1)

## 2016-11-07 LAB — MAGNESIUM: MAGNESIUM: 2.1 mg/dL (ref 1.5–2.5)

## 2016-11-07 LAB — URINE CULTURE
MICRO NUMBER: 80926729
RESULT: NO GROWTH
SPECIMEN QUALITY: ADEQUATE

## 2016-11-07 LAB — MICROALBUMIN / CREATININE URINE RATIO
Creatinine, Urine: 261 mg/dL (ref 20–320)
Microalb Creat Ratio: 7 mcg/mg creat (ref <30)
Microalb, Ur: 1.7 mg/dL

## 2016-11-07 LAB — TSH: TSH: 2.48 mIU/L (ref 0.40–4.50)

## 2016-11-07 LAB — VITAMIN D 25 HYDROXY (VIT D DEFICIENCY, FRACTURES): VIT D 25 HYDROXY: 45 ng/mL (ref 30–100)

## 2016-12-25 LAB — HM MAMMOGRAPHY

## 2016-12-29 ENCOUNTER — Encounter (HOSPITAL_COMMUNITY): Payer: Self-pay

## 2016-12-29 ENCOUNTER — Emergency Department (HOSPITAL_COMMUNITY): Payer: Medicare Other

## 2016-12-29 ENCOUNTER — Emergency Department (HOSPITAL_COMMUNITY)
Admission: EM | Admit: 2016-12-29 | Discharge: 2016-12-29 | Disposition: A | Discharge status: Home / Self Care | Payer: Medicare Other | Attending: Emergency Medicine | Admitting: Emergency Medicine

## 2016-12-29 DIAGNOSIS — E039 Hypothyroidism, unspecified: Secondary | ICD-10-CM | POA: Diagnosis not present

## 2016-12-29 DIAGNOSIS — R112 Nausea with vomiting, unspecified: Secondary | ICD-10-CM | POA: Diagnosis not present

## 2016-12-29 DIAGNOSIS — R51 Headache: Secondary | ICD-10-CM | POA: Insufficient documentation

## 2016-12-29 DIAGNOSIS — R197 Diarrhea, unspecified: Secondary | ICD-10-CM | POA: Diagnosis not present

## 2016-12-29 DIAGNOSIS — Z79899 Other long term (current) drug therapy: Secondary | ICD-10-CM | POA: Insufficient documentation

## 2016-12-29 DIAGNOSIS — R519 Headache, unspecified: Secondary | ICD-10-CM

## 2016-12-29 LAB — I-STAT CHEM 8, ED
BUN: 17 mg/dL (ref 6–20)
CHLORIDE: 98 mmol/L — AB (ref 101–111)
CREATININE: 0.6 mg/dL (ref 0.44–1.00)
Calcium, Ion: 1.09 mmol/L — ABNORMAL LOW (ref 1.15–1.40)
GLUCOSE: 122 mg/dL — AB (ref 65–99)
HEMATOCRIT: 42 % (ref 36.0–46.0)
Hemoglobin: 14.3 g/dL (ref 12.0–15.0)
POTASSIUM: 3.2 mmol/L — AB (ref 3.5–5.1)
Sodium: 137 mmol/L (ref 135–145)
TCO2: 26 mmol/L (ref 22–32)

## 2016-12-29 LAB — CBC WITH DIFFERENTIAL/PLATELET
BASOS ABS: 0 10*3/uL (ref 0.0–0.1)
Basophils Relative: 0 %
Eosinophils Absolute: 0 10*3/uL (ref 0.0–0.7)
Eosinophils Relative: 0 %
HEMATOCRIT: 40.4 % (ref 36.0–46.0)
HEMOGLOBIN: 13.8 g/dL (ref 12.0–15.0)
LYMPHS PCT: 10 %
Lymphs Abs: 0.6 10*3/uL — ABNORMAL LOW (ref 0.7–4.0)
MCH: 32.9 pg (ref 26.0–34.0)
MCHC: 34.2 g/dL (ref 30.0–36.0)
MCV: 96.2 fL (ref 78.0–100.0)
MONO ABS: 0.2 10*3/uL (ref 0.1–1.0)
Monocytes Relative: 3 %
NEUTROS ABS: 5.6 10*3/uL (ref 1.7–7.7)
NEUTROS PCT: 87 %
Platelets: 257 10*3/uL (ref 150–400)
RBC: 4.2 MIL/uL (ref 3.87–5.11)
RDW: 13 % (ref 11.5–15.5)
WBC: 6.4 10*3/uL (ref 4.0–10.5)

## 2016-12-29 MED ORDER — SODIUM CHLORIDE 0.9 % IV BOLUS (SEPSIS)
1000.0000 mL | Freq: Once | INTRAVENOUS | Status: AC
  Administered 2016-12-29: 1000 mL via INTRAVENOUS

## 2016-12-29 MED ORDER — METOCLOPRAMIDE HCL 5 MG/ML IJ SOLN
10.0000 mg | Freq: Once | INTRAMUSCULAR | Status: AC
  Administered 2016-12-29: 10 mg via INTRAVENOUS
  Filled 2016-12-29: qty 2

## 2016-12-29 MED ORDER — ONDANSETRON HCL 4 MG PO TABS: 4.0000 mg | ORAL_TABLET | Freq: Three times a day (TID) | ORAL | 0 refills | Status: DC | PRN

## 2016-12-29 MED ORDER — DIPHENHYDRAMINE HCL 50 MG/ML IJ SOLN
25.0000 mg | Freq: Once | INTRAMUSCULAR | Status: AC
  Administered 2016-12-29: 25 mg via INTRAVENOUS
  Filled 2016-12-29: qty 1

## 2016-12-29 NOTE — ED Provider Notes (Signed)
Patient seen and evaluated. Discussed with PA. 66 year old female. Headache body aches nausea. Treated with headache cocktail and improving. Normal neurological exam. Supple neck. Normal vital signs.Taking by mouth. Reassuring studies including head CT. Appropriate for discharge home in treatment for viral syndrome. Expectant management. When necessary antiemetics   Tanna Furry, MD 12/29/16 2334

## 2016-12-29 NOTE — ED Triage Notes (Signed)
States woke up with neck pain and headache and now has n/v/d no deficits clear speech noted moves all extremities steady gait.

## 2016-12-29 NOTE — ED Provider Notes (Signed)
[facility] DEPT Provider Note   CSN: [id number] Arrival date & time: 12/29/16  1824     History   Chief Complaint Chief Complaint  Patient presents with  . Headache    HPI Kimberly Bowers is a 66 y.o. female.  HPI   66 year old female with hx of anxiety, allergy, diverticulitis presenting to the ER from [facility] for headache. Pt report having sushi with her husband last night.  She did eat a different roll than her husband.  It may possibly have raw fish. Pt woke up with pain in her head and crick in her neck that was mild but now has got worse and constant since then.  Pain to R side of head, behind eye radiates to the back of her head.  Pain is a tight feeling, not improve with tylenol extra strength. Report n/v/d, and has tried zofran but vomited up. Sts she vomit only a small amount and did not notice any blood or bile.  Diarrhea without blood or mucous. No prior hx of migraine or headache.  Denies dizziness, vision changes, hearing changes, focal weakness, tearing or sweating, no cp, sob.  Does takes Synthroid for her hypothyroidism.  She had a normal physical and blood work by her PCP last month.      Past Medical History:  Diagnosis Date  . Allergy   . Anxiety   . Diverticulitis 2004  . Hypothyroid   . Neutropenia [facility])     Patient Active Problem List   Diagnosis Date Noted  . Encounter for Medicare annual wellness exam 11/29/2014  . Vitamin D deficiency 03/17/2014  . Medication management 03/17/2014  . Mixed hyperlipidemia 03/25/2013  . Anxiety   . Hypothyroid   . Neutropenia ([place])   . Allergy     Past Surgical History:  Procedure Laterality Date  . BRACHIOPLASTY  03/2016  . FOOT SURGERY Right    benign cyst  . KNEE ARTHROSCOPY Left   . LAPAROSCOPIC GASTRIC BANDING  2004  . LAPAROSCOPIC REPAIR AND REMOVAL OF GASTRIC BAND     8/14  . TUBAL LIGATION     age 62  . UTERINE FIBROID SURGERY  2001    OB History    Gravida Para Term  Preterm AB Living   2 2 2  0 0 2   SAB TAB Ectopic Multiple Live Births   0 0 0 0 2       Home Medications    Prior to Admission medications   Medication Sig Start Date End Date Taking? Authorizing Provider  ALPRAZolam Duanne Moron) 0.25 MG tablet take 1/2 to 1 tablet by mouth three times a day if needed for anxiety 11/04/16   Vicie Mutters, PA-C  B Complex Vitamins (B COMPLEX PO) Take by mouth.    [provider]  Cholecalciferol (VITAMIN D) 2000 UNITS tablet Take 2,000 Units by mouth daily.    [provider]  ciprofloxacin (CIPRO) 500 MG tablet Take 1 tablet (500 mg total) by mouth 2 (two) times daily. 11/01/16   Vicie Mutters, PA-C  cyclobenzaprine (FLEXERIL) [name] tablet Take 1 tablet (10 mg total) by mouth at bedtime as needed for muscle spasms. 11/01/16   Vicie Mutters, PA-C  fexofenadine (ALLEGRA) 60 MG tablet Take 60 mg by mouth daily as needed for allergies or rhinitis.    [provider]  fluconazole (DIFLUCAN) 150 MG tablet One time for yeast infection 11/01/16   Vicie Mutters, PA-C  Hyoscyamine Sulfate SL (LEVSIN/SL) 0.125  MG SUBL Place 0.125 mg under the tongue every 4 (four) hours as needed. 11/01/16   Vicie Mutters, PA-C  levothyroxine (SYNTHROID) 100 MCG tablet take 1 and 1/2 tablets by mouth ON SUNDAY THEN 1 TABLET DAILY THE REST OF THE WEEK 11/04/16   Vicie Mutters, PA-C  MAGNESIUM PO Take by mouth daily.    [provider]  mometasone (NASONEX) 50 MCG/ACT nasal spray instill 2 sprays into each nostril once daily 07/14/15   Vicie Mutters, PA-C  ondansetron (ZOFRAN) 4 MG tablet Take 1 tablet (4 mg total) by mouth daily as needed for nausea or vomiting (Not sedating). 11/01/16 11/01/17  Vicie Mutters, PA-C  predniSONE (DELTASONE) 20 MG tablet 2 tablets daily for 3 days, 1 tablet daily for 4 days. 11/01/16   Vicie Mutters, PA-C  Wheat Dextrin (BENEFIBER) POWD Take by mouth 2 (two) times daily.    [provider]    Family  History Family History  Problem Relation Age of Onset  . Cancer Mother        lung  . Diabetes Father     Social History Social History  Substance Use Topics  . Smoking status: Never Smoker  . Smokeless tobacco: Never Used  . Alcohol use 0.6 - 1.2 oz/week    1 - 2 Glasses of wine per week     Allergies   Patient has no known allergies.   Review of Systems Review of Systems  All other systems reviewed and are negative.    Physical Exam Updated Vital Signs BP (!) 152/111 (BP Location: Right Arm)   Pulse 87   Temp 98.1 F (36.7 C) (Oral)   Resp 18   Ht 5\' 6"  (1.676 m)   Wt 77.1 kg (170 lb)   LMP 03/11/2000   SpO2 100%   BMI 27.44 kg/m   Physical Exam  Constitutional: She is oriented to person, place, and time. She appears well-developed and well-nourished. No distress.  Patient is well-appearing and resting in bed in no acute discomfort  HENT:  Head: Atraumatic.  Eyes: Pupils are equal, round, and reactive to light. Conjunctivae and EOM are normal.  Neck: Normal range of motion. Neck supple.  No nuchal rigidity  Cardiovascular: Normal rate and regular rhythm.   Pulmonary/Chest: Effort normal and breath sounds normal.  Abdominal: Soft.  Hyperactive bowel sounds but abdomen is soft and nontender  Neurological: She is alert and oriented to person, place, and time.  Neurologic exam:  Speech clear, pupils equal round reactive to light, extraocular movements intact  Normal peripheral visual fields Cranial nerves III through XII normal including no facial droop Follows commands, moves all extremities x4, normal strength to bilateral upper and lower extremities at all major muscle groups including grip Sensation normal to light touch  Coordination intact, no limb ataxia, finger-nose-finger normal Rapid alternating movements normal No pronator drift Gait normal   Skin: No rash noted.  Psychiatric: She has a normal mood and affect.  Nursing note and vitals  reviewed.    ED Treatments / Results  Labs (all labs ordered are listed, but only abnormal results are displayed) Labs Reviewed  CBC WITH DIFFERENTIAL/PLATELET - Abnormal; Notable for the following:       Result Value   Lymphs Abs 0.6 (*)    All other components within normal limits  I-STAT CHEM 8, ED - Abnormal; Notable for the following:    Potassium 3.2 (*)    Chloride 98 (*)    Glucose, Bld 122 (*)  Calcium, Ion 1.09 (*)    All other components within normal limits    EKG  EKG Interpretation None       Radiology Ct Head Wo Contrast  Result Date: 12/29/2016 CLINICAL DATA:  Acute onset of headache and neck pain. Nausea, vomiting and diarrhea. Initial encounter. EXAM: CT HEAD WITHOUT CONTRAST TECHNIQUE: Contiguous axial images were obtained from the base of the skull through the vertex without intravenous contrast. COMPARISON:  None. FINDINGS: Brain: No evidence of acute infarction, hemorrhage, hydrocephalus, extra-axial collection or mass lesion/mass effect. The posterior fossa, including the cerebellum, brainstem and fourth ventricle, is within normal limits. The third and lateral ventricles, and basal ganglia are unremarkable in appearance. The cerebral hemispheres are symmetric in appearance, with normal gray-white differentiation. No mass effect or midline shift is seen. Vascular: No hyperdense vessel or unexpected calcification. Skull: There is no evidence of fracture; visualized osseous structures are unremarkable in appearance. Sinuses/Orbits: The visualized portions of the orbits are within normal limits. The paranasal sinuses and mastoid air cells are well-aerated. Other: No significant soft tissue abnormalities are seen. IMPRESSION: Unremarkable noncontrast CT of the head. Electronically Signed   By: Garald Balding M.D.   On: 12/29/2016 23:16    Procedures Procedures (including critical care time)  Medications Ordered in ED Medications  sodium chloride 0.9 % bolus  1,000 mL (1,000 mLs Intravenous New Bag/Given 12/29/16 2234)  metoCLOPramide (REGLAN) injection 10 mg (10 mg Intravenous Given 12/29/16 2234)  diphenhydrAMINE (BENADRYL) injection 25 mg (25 mg Intravenous Given 12/29/16 2234)     Initial Impression / Assessment and Plan / ED Course  I have reviewed the triage vital signs and the nursing notes.  Pertinent labs & imaging results that were available during my care of the patient were reviewed by me and considered in my medical decision making (see chart for details).     BP (!) 152/111 (BP Location: Right Arm)   Pulse 87   Temp 98.1 F (36.7 C) (Oral)   Resp 18   Ht 5\' 6"  (1.676 m)   Wt 77.1 kg (170 lb)   LMP 03/11/2000   SpO2 100%   BMI 27.44 kg/m    Final Clinical Impressions(s) / ED Diagnoses   Final diagnoses:  Bad headache  Nausea vomiting and diarrhea    New Prescriptions New Prescriptions   No medications on file   10:10 PM Patient here with complaints of nausea vomiting diarrhea after eating sushi last night. She also report having right-sided headache without any history of migraine or history of headaches in the past. She has no focal neuro deficit on exam. She is well-appearing. No fever. She has a fairly benign abdominal exam. Will obtain screening labs, obtain head CT scan, given IV fluid as well as symptomatic treatment. I have low suspicion for subarachnoid hemorrhage, stroke, space-occupying lesion, or meningitis.  11:28 PM Labs are reassuring, mild hypokalemia with K+ 3.2, recommend consumption of banana and f/u with PCP for recheck. Head CT scan normal.  Pt felt better with treatment.  She's stable for discharge.  Return precaution given.  Care discussed with Dr. Jeneen Rinks.    Domenic Moras, PA-C 12/29/16 2329    Tanna Furry, MD 01/17/17 [phone]

## 2016-12-30 ENCOUNTER — Encounter: Payer: Self-pay | Admitting: *Deleted

## 2017-01-01 ENCOUNTER — Other Ambulatory Visit: Payer: Self-pay | Admitting: Physician Assistant

## 2017-01-01 DIAGNOSIS — Z7184 Encounter for health counseling related to travel: Secondary | ICD-10-CM

## 2017-03-11 HISTORY — PX: CATARACT EXTRACTION: SUR2

## 2017-04-21 ENCOUNTER — Other Ambulatory Visit: Payer: Self-pay

## 2017-04-21 MED ORDER — LEVOTHYROXINE SODIUM 100 MCG PO TABS: ORAL_TABLET | ORAL | 0 refills | Status: DC

## 2017-05-20 ENCOUNTER — Ambulatory Visit: Payer: Self-pay | Admitting: Physician Assistant

## 2017-07-17 ENCOUNTER — Other Ambulatory Visit: Payer: Self-pay | Admitting: Physician Assistant

## 2017-07-20 ENCOUNTER — Other Ambulatory Visit: Payer: Self-pay | Admitting: Adult Health

## 2017-07-20 ENCOUNTER — Other Ambulatory Visit: Payer: Self-pay | Admitting: Internal Medicine

## 2017-07-20 DIAGNOSIS — Z7184 Encounter for health counseling related to travel: Secondary | ICD-10-CM

## 2017-07-20 MED ORDER — CYCLOBENZAPRINE HCL 10 MG PO TABS: ORAL_TABLET | ORAL | 0 refills | Status: DC

## 2017-07-23 ENCOUNTER — Other Ambulatory Visit: Payer: Self-pay

## 2017-07-23 MED ORDER — BACLOFEN 5 MG PO TABS: 5.0000 mg | ORAL_TABLET | Freq: Three times a day (TID) | ORAL | 1 refills | Status: DC

## 2017-07-23 NOTE — Telephone Encounter (Signed)
Insurance does not cover Cyclobenzaprine. New prescription will be written for Baclofen.

## 2017-07-31 ENCOUNTER — Other Ambulatory Visit: Payer: Self-pay | Admitting: Adult Health

## 2017-08-20 ENCOUNTER — Ambulatory Visit: Payer: Medicare Other | Admitting: Certified Nurse Midwife

## 2017-09-04 ENCOUNTER — Other Ambulatory Visit: Payer: Self-pay

## 2017-09-04 ENCOUNTER — Ambulatory Visit (INDEPENDENT_AMBULATORY_CARE_PROVIDER_SITE_OTHER): Payer: Medicare Other | Admitting: Certified Nurse Midwife

## 2017-09-04 ENCOUNTER — Encounter: Payer: Self-pay | Admitting: Certified Nurse Midwife

## 2017-09-04 VITALS — BP 110/80 | HR 70 | Resp 16 | Ht 65.25 in | Wt 172.0 lb

## 2017-09-04 DIAGNOSIS — N951 Menopausal and female climacteric states: Secondary | ICD-10-CM

## 2017-09-04 DIAGNOSIS — Z01419 Encounter for gynecological examination (general) (routine) without abnormal findings: Secondary | ICD-10-CM

## 2017-09-04 NOTE — Progress Notes (Signed)
67 y.o. G19P2002 Married  Caucasian Fe here for annual exam. Menopausal no HRT. Denies vaginal bleeding. Vaginal dryness much better with coconut oil use. Has noted small ? Bite in right ear canal, slight itching. Please check. Sees PCP for aex, labs, Hypothyroid management and Vitamin D.  Had cataract surgery early this year and MD noted heart murmur, was told to follow up with PCP regarding. Denies SOB or chest pain or fatigue changes. Exercising with dancing 3-4 times weekly with no problems. Drinks caffeine daily. Due for colonoscopy this year and plans to call and schedule. No other health issues today. Recent return from vacation in [place]!  Patient's last menstrual period was 03/11/2000.          Sexually active: Yes.    The current method of family planning is tubal ligation.    Exercising: Yes.    dance & zumba Smoker:  no  Health Maintenance: Pap: 07-22-13 neg HPV HR neg,  08-03-15 neg History of Abnormal Pap: no MMG:  12-25-16 category c density birads 2:neg Self Breast exams: yes Colonoscopy:  2009 f/u 29yrs BMD:   2017 TDaP:  2014 Shingles: 2014 Pneumonia: 2017 Hep C and HIV: hep c neg 2016 Labs: PCP   reports that she has never smoked. She has never used smokeless tobacco. She reports that she drinks about 0.6 - 1.2 oz of alcohol per week. She reports that she does not use drugs.  Past Medical History:  Diagnosis Date  . Allergy   . Anxiety   . Diverticulitis 2004  . Hypothyroid   . Neutropenia [facility])     Past Surgical History:  Procedure Laterality Date  . BRACHIOPLASTY  03/2016  . FOOT SURGERY Right    benign cyst  . KNEE ARTHROSCOPY Left   . LAPAROSCOPIC GASTRIC BANDING  2004  . LAPAROSCOPIC REPAIR AND REMOVAL OF GASTRIC BAND     8/14  . TUBAL LIGATION     age 45  . UTERINE FIBROID SURGERY  2001    Current Outpatient Medications  Medication Sig Dispense Refill  . ALPRAZolam (XANAX) 0.25 MG tablet TAKE 1/2 TO 1 TABLET BY MOUTH 3 TIMES DAILY AS NEEDED FOR  ANXIETY 90 tablet 0  . B Complex Vitamins (B COMPLEX PO) Take by mouth.    . baclofen 5 MG TABS Take 5 mg by mouth 3 (three) times daily. 90 tablet 1  . Cholecalciferol (VITAMIN D) 2000 UNITS tablet Take 2,000 Units by mouth daily.    . ciprofloxacin (CIPRO) 500 MG tablet Take 1 tablet (500 mg total) by mouth 2 (two) times daily. 14 tablet 0  . fexofenadine (ALLEGRA) 60 MG tablet Take 60 mg by mouth daily as needed for allergies or rhinitis.    . fluconazole (DIFLUCAN) 150 MG tablet One time for yeast infection 1 tablet 0  . Hyoscyamine Sulfate SL (LEVSIN/SL) 0.125 MG SUBL Place 0.125 mg under the tongue every 4 (four) hours as needed. 30 each 0  . levothyroxine (SYNTHROID) 100 MCG tablet TAKE ONE AND ONE-HALF TABLETS BY MOUTH DAILY ON SUNDAY AND THEN ONE TABLET DAILY THE REST OF THE WEEK 110 tablet 0  . MAGNESIUM PO Take by mouth daily.    . mometasone (NASONEX) 50 MCG/ACT nasal spray instill 2 sprays into each nostril once daily 17 g 3  . ondansetron (ZOFRAN) 4 MG tablet Take 1 tablet (4 mg total) by mouth every 8 (eight) hours as needed for nausea or vomiting. 12 tablet 0  . predniSONE (DELTASONE) 20  MG tablet 2 tablets daily for 3 days, 1 tablet daily for 4 days. 10 tablet 0  . Wheat Dextrin (BENEFIBER) POWD Take by mouth 2 (two) times daily.     No current facility-administered medications for this visit.     Family History  Problem Relation Age of Onset  . Cancer Mother        lung  . Diabetes Father     ROS:  Pertinent items are noted in HPI.  Otherwise, a comprehensive ROS was negative.  Exam:   LMP 03/11/2000    Ht Readings from Last 3 Encounters:  12/29/16 5\' 6"  (1.676 m)  11/01/16 5\' 6"  (1.676 m)  08/14/16 5' 5.25" (1.657 m)    General appearance: alert, cooperative and appears stated age Head: Normocephalic, without obvious abnormality, atraumatic Right ear small bb size suspect bite slightly red, took picture for patient with her phone to follow with spouse  checking Neck: no adenopathy, supple, symmetrical, trachea midline and thyroid normal to inspection and palpation Lungs: clear to auscultation bilaterally Breasts: normal appearance, no masses or tenderness, No nipple retraction or dimpling, No nipple discharge or bleeding, No axillary or supraclavicular adenopathy Heart: regular rate and rhythm, ? Grade 1/4 murmur not heard all the time Abdomen: soft, non-tender; no masses,  no organomegaly Extremities: extremities normal, atraumatic, no cyanosis or edema Skin: Skin color, texture, turgor normal. No rashes or lesions Lymph nodes: Cervical, supraclavicular, and axillary nodes normal. No abnormal inguinal nodes palpated Neurologic: Grossly normal   Pelvic: External genitalia:  no lesions, atrophic appearance              Urethra:  normal appearing urethra with no masses, tenderness or lesions              Bartholin's and Skene's: normal                 Vagina: slight atrophic appearing vagina with normal color and discharge, moisture  Noted, no lesions              Cervix: no cervical motion tenderness, no lesions and normal appearance              Pap taken: No. Bimanual Exam:  Uterus:  normal size, contour, position, consistency, mobility, non-tender and mid position              Adnexa: normal adnexa and no mass, fullness, tenderness               Rectovaginal: Confirms               Anus:  normal sphincter tone, no lesions  Chaperone present: yes  A:  Well Woman with normal exam  Post menopausal no HRT  Vaginal dryness using coconut oil with good response  Suspect bite in right ear  Hypothyroid with MD management  ? Heart murmur  P:   Reviewed health and wellness pertinent to exam  Aware if need to advise if vaginal bleeding  Continue use of coconut oil and advise if issues  Discussed small amount of 1 % hydrocortisone to ear area, if not resolving of increasing itching needs to see PCP  Continue follow up with PCP  Discussed  ? Murmur heard and need to follow up with PCP. Patient agreeable. Warning signs of heart issues reviewed and need to be seen.  Pap smear: no   counseled on breast self exam, mammography screening, feminine hygiene, adequate intake of calcium and vitamin D, diet and exercise  return annually or prn  An After Visit Summary was printed and given to the patient.

## 2017-09-04 NOTE — Patient Instructions (Signed)

## 2017-10-09 ENCOUNTER — Encounter: Payer: Self-pay | Admitting: Gastroenterology

## 2017-10-31 ENCOUNTER — Encounter: Payer: Self-pay | Admitting: Gastroenterology

## 2017-11-04 ENCOUNTER — Encounter: Payer: Self-pay | Admitting: Physician Assistant

## 2017-11-13 ENCOUNTER — Encounter: Payer: Self-pay | Admitting: Gastroenterology

## 2017-11-13 ENCOUNTER — Ambulatory Visit (AMBULATORY_SURGERY_CENTER): Payer: Self-pay | Admitting: *Deleted

## 2017-11-13 ENCOUNTER — Other Ambulatory Visit: Payer: Self-pay

## 2017-11-13 VITALS — Ht 66.0 in | Wt 180.0 lb

## 2017-11-13 DIAGNOSIS — Z1211 Encounter for screening for malignant neoplasm of colon: Secondary | ICD-10-CM

## 2017-11-13 MED ORDER — SUPREP BOWEL PREP KIT 17.5-3.13-1.6 GM/177ML PO SOLN: 1.0000 | Freq: Once | ORAL | 0 refills | Status: AC

## 2017-11-13 NOTE — Progress Notes (Signed)
No egg or soy allergy known to patient  No issues with past sedation with any surgeries  or procedures, no intubation problems  No diet pills per patient No home 02 use per patient  No blood thinners per patient  Pt denies issues with constipation  No A fib or A flutter  EMMI video , offered and declined by the patient.

## 2017-11-27 ENCOUNTER — Ambulatory Visit (AMBULATORY_SURGERY_CENTER): Payer: Medicare Other | Admitting: Gastroenterology

## 2017-11-27 ENCOUNTER — Other Ambulatory Visit: Payer: Self-pay | Admitting: Internal Medicine

## 2017-11-27 ENCOUNTER — Encounter: Payer: Self-pay | Admitting: Gastroenterology

## 2017-11-27 VITALS — BP 132/85 | HR 68 | Temp 98.4°F | Resp 20 | Ht 66.0 in | Wt 180.0 lb

## 2017-11-27 DIAGNOSIS — D125 Benign neoplasm of sigmoid colon: Secondary | ICD-10-CM

## 2017-11-27 DIAGNOSIS — D123 Benign neoplasm of transverse colon: Secondary | ICD-10-CM

## 2017-11-27 DIAGNOSIS — Z1211 Encounter for screening for malignant neoplasm of colon: Secondary | ICD-10-CM

## 2017-11-27 MED ORDER — SODIUM CHLORIDE 0.9 % IV SOLN
500.0000 mL | Freq: Once | INTRAVENOUS | Status: DC
Start: 2017-11-27 — End: 2020-02-17

## 2017-11-27 NOTE — Progress Notes (Signed)
To PACU, VSS. Report to [email or web address] 

## 2017-11-27 NOTE — Progress Notes (Signed)
Pt's states no medical or surgical changes since previsit or office visit. 

## 2017-11-27 NOTE — Op Note (Signed)
[facility] Patient Name: Kimberly Bowers Procedure Date: 11/27/2017 12:36 PM MRN: [id number] Endoscopist: Mauri Pole , MD Age: 67 Referring MD:  Date of Birth: 05-25-1950 Gender: Female Account #: [id number] Procedure:                Colonoscopy Indications:              Screening for colorectal malignant neoplasm Medicines:                Monitored Anesthesia Care Procedure:                Pre-Anesthesia Assessment:                           - Prior to the procedure, a History and Physical                            was performed, and patient medications and                            allergies were reviewed. The patient's tolerance of                            previous anesthesia was also reviewed. The risks                            and benefits of the procedure and the sedation                            options and risks were discussed with the patient.                            All questions were answered, and informed consent                            was obtained. Prior Anticoagulants: The patient has                            taken no previous anticoagulant or antiplatelet                            agents. ASA Grade Assessment: II - A patient with                            mild systemic disease. After reviewing the risks                            and benefits, the patient was deemed in                            satisfactory condition to undergo the procedure.                           After obtaining informed consent, the colonoscope  was passed under direct vision. Throughout the                            procedure, the patient's blood pressure, pulse, and                            oxygen saturations were monitored continuously. The                            Colonoscope was introduced through the anus and                            advanced to the the cecum, identified by                            appendiceal orifice and  ileocecal valve. The                            colonoscopy was performed without difficulty. The                            patient tolerated the procedure well. The quality                            of the bowel preparation was good. The ileocecal                            valve, appendiceal orifice, and rectum were                            photographed. Scope In: 12:40:02 PM Scope Out: 12:58:15 PM Scope Withdrawal Time: 0 hours 8 minutes 58 seconds  Total Procedure Duration: 0 hours 18 minutes 13 seconds  Findings:                 The perianal and digital rectal examinations were                            normal.                           A 4 mm polyp was found in the transverse colon. The                            polyp was sessile. The polyp was removed with a                            cold snare. Resection and retrieval were complete.                           Two sessile polyps were found in the sigmoid colon                            and transverse colon. The polyps were 1 to 3 mm in  size. These polyps were removed with a cold biopsy                            forceps. Resection and retrieval were complete.                           Scattered small and large-mouthed diverticula were                            found in the sigmoid colon, descending colon,                            transverse colon and ascending colon.                           Non-bleeding internal hemorrhoids were found during                            retroflexion. The hemorrhoids were small.                           A diffuse area of severe melanosis was found in the                            entire colon. Complications:            No immediate complications. Estimated Blood Loss:     Estimated blood loss was minimal. Impression:               - One 4 mm polyp in the transverse colon, removed                            with a cold snare. Resected and retrieved.                            - Two 1 to 3 mm polyps in the sigmoid colon and in                            the transverse colon, removed with a cold biopsy                            forceps. Resected and retrieved.                           - Diverticulosis in the sigmoid colon, in the                            descending colon, in the transverse colon and in                            the ascending colon.                           - Non-bleeding internal hemorrhoids.                           -  Melanosis in the colon. Recommendation:           - Patient has a contact number available for                            emergencies. The signs and symptoms of potential                            delayed complications were discussed with the                            patient. Return to normal activities tomorrow.                            Written discharge instructions were provided to the                            patient.                           - Resume previous diet.                           - Continue present medications.                           - Await pathology results.                           - Repeat colonoscopy in 3 - 5 years for                            surveillance based on pathology results. Mauri Pole, MD 11/27/2017 1:02:40 PM This report has been signed electronically.

## 2017-11-27 NOTE — Patient Instructions (Signed)
   Information on polyps,diverticulosis,& hemorrhoids given to you today   Await pathology results on polyps removed   YOU HAD AN ENDOSCOPIC PROCEDURE TODAY AT THE [facility]:   Refer to the procedure report that was given to you for any specific questions about what was found during the examination.  If the procedure report does not answer your questions, please call your gastroenterologist to clarify.  If you requested that your care partner not be given the details of your procedure findings, then the procedure report has been included in a sealed envelope for you to review at your convenience later.  YOU SHOULD EXPECT: Some feelings of bloating in the abdomen. Passage of more gas than usual.  Walking can help get rid of the air that was put into your GI tract during the procedure and reduce the bloating. If you had a lower endoscopy (such as a colonoscopy or flexible sigmoidoscopy) you may notice spotting of blood in your stool or on the toilet paper. If you underwent a bowel prep for your procedure, you may not have a normal bowel movement for a few days.  Please Note:  You might notice some irritation and congestion in your nose or some drainage.  This is from the oxygen used during your procedure.  There is no need for concern and it should clear up in a day or so.  SYMPTOMS TO REPORT IMMEDIATELY:   Following lower endoscopy (colonoscopy or flexible sigmoidoscopy):  Excessive amounts of blood in the stool  Significant tenderness or worsening of abdominal pains  Swelling of the abdomen that is new, acute  Fever of 100F or higher   For urgent or emergent issues, a gastroenterologist can be reached at any hour by calling [phone].   DIET:  We do recommend a small meal at first, but then you may proceed to your regular diet.  Drink plenty of fluids but you should avoid alcoholic beverages for 24 hours.  ACTIVITY:  You should plan to take it easy for the rest of  today and you should NOT DRIVE or use heavy machinery until tomorrow (because of the sedation medicines used during the test).    FOLLOW UP: Our staff will call the number listed on your records the next business day following your procedure to check on you and address any questions or concerns that you may have regarding the information given to you following your procedure. If we do not reach you, we will leave a message.  However, if you are feeling well and you are not experiencing any problems, there is no need to return our call.  We will assume that you have returned to your regular daily activities without incident.  If any biopsies were taken you will be contacted by phone or by letter within the next 1-3 weeks.  Please call us at [phone] if you have not heard about the biopsies in 3 weeks.    SIGNATURES/CONFIDENTIALITY: You and/or your care partner have signed paperwork which will be entered into your electronic medical record.  These signatures attest to the fact that that the information above on your After Visit Summary has been reviewed and is understood.  Full responsibility of the confidentiality of this discharge information lies with you and/or your care-partner. 

## 2017-11-27 NOTE — Progress Notes (Signed)
Called to room to assist during endoscopic procedure.  Patient ID and intended procedure confirmed with present staff. Received instructions for my participation in the procedure from the performing physician.  

## 2017-11-28 ENCOUNTER — Telehealth: Payer: Self-pay

## 2017-11-28 NOTE — Telephone Encounter (Signed)
  Follow up Call-  Call back number 11/27/2017  Post procedure Call Back phone  # [phone]  Permission to leave phone message Yes  Some recent data might be hidden     Patient questions:  Do you have a fever, pain , or abdominal swelling? No. Pain Score  0 *  Have you tolerated food without any problems? Yes.    Have you been able to return to your normal activities? Yes.    Do you have any questions about your discharge instructions: Diet   No. Medications  No. Follow up visit  No.  Do you have questions or concerns about your Care? No.  Actions: * If pain score is 4 or above: No action needed, pain <4.

## 2017-12-10 ENCOUNTER — Encounter: Payer: Self-pay | Admitting: Gastroenterology

## 2017-12-16 ENCOUNTER — Encounter: Payer: Self-pay | Admitting: Physician Assistant

## 2018-01-09 HISTORY — PX: BREAST BIOPSY: SHX20

## 2018-01-12 ENCOUNTER — Encounter: Payer: Self-pay | Admitting: Certified Nurse Midwife

## 2018-01-21 LAB — HM MAMMOGRAPHY

## 2018-02-02 ENCOUNTER — Other Ambulatory Visit: Payer: Self-pay | Admitting: Radiology

## 2018-02-09 NOTE — Progress Notes (Signed)
WELLNESS AND FOLLOW UP  Assessment:    Encounter for Medicare annual wellness exam 1 year  Neutropenia, unspecified type ([place]) Monitor CBC  Mixed hyperlipidemia check lipids decrease fatty foods increase activity.   Medication management  Vitamin D deficiency Continue supplement  Hypothyroidism, unspecified type Hypothyroidism-check TSH level, continue medications the same, reminded to take on an empty stomach 30-47mins before food.   Anxiety Continue exercise.   Allergic state, subsequent encounter Allergic rhinitis - Allegra OTC, increase H20, allergy hygiene explained. - voice rest and hot teas for laryngitis  Declines labs today will get at CPE in 1 year   Future Appointments  Date Time Provider Vander  09/01/2018 10:30 AM Regina Eck, CNM [place] None     Subjective:   Kimberly Bowers is a 67 y.o. female who presents for wellness and 3 month follow up on hypertension, hypothyroidism, obesity, hyperlipidemia, vitamin D def.   Her blood pressure has been controlled at home, BP: (!) 130/100/ She is on prednisone and mucinex D so it has been elevated.   She was told Jan at cataract surgery that she had a murmur, has never been told that before, no fam hx of heart issues, No dizziness, SOB, CP, leg swelling.   She went to UC on Friday and given prednisone, she is not coughing anymore other than laryngitis and fatigue.   She does workout, doing dance at [facility], [place], 2 x a week and zumba. She denies chest pain, shortness of breath, dizziness.  She is not on cholesterol medication and denies myalgias. Her cholesterol is at goal. The cholesterol last visit was:   Lab Results  Component Value Date   CHOL 226 (H) 11/01/2016   HDL 90 11/01/2016   LDLCALC 120 (H) 11/01/2016   TRIG 66 11/01/2016   CHOLHDL 2.5 11/01/2016   Last A1C in the office was:  Lab Results  Component Value Date   HGBA1C 5.2 08/05/2013   Patient is on Vitamin D  supplement.   Has history of diverticulitis, has increased fiber.   She is on thyroid medication. Her medication was not changed last visit. She is on 1102mcg every day.  Lab Results  Component Value Date   TSH 2.48 11/01/2016   BMI is Body mass index is 28.89 kg/m., she is working on diet and exercise. Wt Readings from Last 3 Encounters:  02/10/18 179 lb (81.2 kg)  11/27/17 180 lb (81.6 kg)  11/13/17 180 lb (81.6 kg)    Names of Other Physician/Practitioners you currently use: 1. [facility]- here for primary care 2. Dr. [place], eye doctor, last visit Jan 2019 3. Dr. Orvil Feil, dentist, last visit q 6 months ago 4. [place] in Jan 2019 cataract Patient Care Team: Vicie Mutters, PA-C as PCP - General (Physician Assistant) Sable Feil, MD as Consulting Physician (Gastroenterology) Elsie Saas, MD as Consulting Physician (Orthopedic Surgery) Megan Salon, MD as Consulting Physician (Gynecology)   Medication Review Current Outpatient Medications on File Prior to Visit  Medication Sig Dispense Refill  . B Complex Vitamins (B COMPLEX PO) Take by mouth.    . Cholecalciferol (VITAMIN D) 2000 UNITS tablet Take 2,000 Units by mouth daily.    . diclofenac (VOLTAREN) 75 MG EC tablet Take 75 mg by mouth 2 (two) times daily with a meal.  4  . fexofenadine (ALLEGRA) 60 MG tablet Take 60 mg by mouth daily as needed for allergies or rhinitis.    Marland Kitchen levothyroxine (SYNTHROID)  100 MCG tablet Take ONE tablet daily or as DIRECTED by PROVIDER. 90 tablet 3  . MAGNESIUM PO Take by mouth daily.    . mometasone (NASONEX) 50 MCG/ACT nasal spray instill 2 sprays into each nostril once daily 17 g 3  . Wheat Dextrin (BENEFIBER) POWD Take by mouth 2 (two) times daily.    Marland Kitchen XIIDRA 5 % SOLN      Current Facility-Administered Medications on File Prior to Visit  Medication Dose Route Frequency Provider Last Rate Last Dose  . 0.9 %  sodium chloride infusion  500  mL Intravenous Once Nandigam, Venia Minks, MD        Current Problems (verified) Patient Active Problem List   Diagnosis Date Noted  . Vitamin D deficiency 03/17/2014  . Medication management 03/17/2014  . Mixed hyperlipidemia 03/25/2013  . Anxiety   . Hypothyroid   . Neutropenia ([place])   . Allergy     Screening Tests Immunization History  Administered Date(s) Administered  . Influenza Split 12/08/2013  . Influenza, High Dose Seasonal PF 11/19/2014, 11/01/2016  . Influenza-Unspecified 12/29/2012, 12/19/2017  . Pneumococcal Conjugate-13 09/05/2014  . Pneumococcal Polysaccharide-23 11/02/2015  . Td 03/11/2004  . Tdap 07/08/2012  . Zoster 07/08/2012    Preventative care: Last colonoscopy: 2019 EGD: 2009 Last mammogram: 12/2017- had left BENIGN breast biopsy Last pap smear/pelvic exam: 07/2015 DEXA:04/2015 CXR 2006 CT head 12/2016  Prior vaccinations: TD or Tdap: 2014  Influenza: 2019 Pneumococcal: 2017 Prevnar13: 2016 Shingles/Zostavax: 2014  History reviewed: allergies, current medications, past family history, past medical history, past social history, past surgical history and problem list  Allergies No Known Allergies  SURGICAL HISTORY She  has a past surgical history that includes Laparoscopic gastric banding (2004); Knee arthroscopy (Left); Uterine fibroid surgery (2001); Foot surgery (Right); Tubal ligation; Laparoscopic repair and removal of gastric band; Brachioplasty (03/2016); Cataract extraction (2019); and Breast biopsy (Left, 01/2018). FAMILY HISTORY Her family history includes Cancer in her mother; Diabetes in her father. SOCIAL HISTORY She  reports that she has never smoked. She has never used smokeless tobacco. She reports that she drinks about 1.0 - 2.0 standard drinks of alcohol per week. She reports that she does not use drugs.  MEDICARE WELLNESS OBJECTIVES: Physical activity: Current Exercise Habits: Structured exercise class, Time (Minutes):  20, Frequency (Times/Week): 3, Weekly Exercise (Minutes/Week): 60 Cardiac risk factors: Cardiac Risk Factors include: advanced age (>48men, >35 women);hypertension Depression/mood screen:   Depression screen [facility] 2/9 02/10/2018  Decreased Interest 0  Down, Depressed, Hopeless 0  PHQ - 2 Score 0    ADLs:  In your present state of health, do you have any difficulty performing the following activities: 02/10/2018  Hearing? N  Vision? N  Difficulty concentrating or making decisions? N  Walking or climbing stairs? N  Dressing or bathing? N  Doing errands, shopping? N  Some recent data might be hidden     Cognitive Testing  Alert? Yes  Normal Appearance?Yes  Oriented to person? Yes  Place? Yes   Time? Yes  Recall of three objects?  Yes  Can perform simple calculations? Yes  Displays appropriate judgment?Yes  Can read the correct time from a watch face?Yes  EOL planning: Does Patient Have a Medical Advance Directive?: Yes Type of Advance Directive: Healthcare Power of Attorney, Living will Copy of [place] in Chart?: No - copy requested    Review of Systems:  Review of Systems  Constitutional: Negative.   HENT: Negative.   Eyes: Negative.  Respiratory: Negative.   Cardiovascular: Negative.   Gastrointestinal: Negative.   Genitourinary: Negative.   Musculoskeletal: Negative.   Skin: Negative.   Neurological: Negative.   Endo/Heme/Allergies: Negative.   Psychiatric/Behavioral: The patient does not have insomnia.     Objective:   Today's Vitals   02/10/18 1508  BP: (!) 130/100  Pulse: 66  Temp: 98.7 F (37.1 C)  SpO2: 96%  Weight: 179 lb (81.2 kg)  Height: 5\' 6"  (1.676 m)    General appearance: alert, no distress, WD/WN,  female HEENT: normocephalic, sclerae anicteric, TMs pearly, nares patent, no discharge or erythema, pharynx normal Oral cavity: MMM, no lesions Neck: supple, no lymphadenopathy, no thyromegaly, no masses Heart: RRR, normal  S1, S2, prominent S2 versus late systolic murmur Lungs: CTA bilaterally, no wheezes, rhonchi, or rales Abdomen: +bs, soft, non tender, non distended, no masses, no hepatomegaly, no splenomegaly Musculoskeletal: nontender, no swelling, no obvious deformity 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  Extremities: no edema, no cyanosis, no clubbing Pulses: 2+ symmetric, upper and lower extremities, normal cap refill Neurological: alert, oriented x 3, CN2-12 intact, strength normal upper extremities and lower extremities, sensation normal throughout, DTRs 2+ throughout, no cerebellar signs, gait normal Psychiatric: normal affect, behavior normal, pleasant  Breast: defer Gyn: defer Rectal: defer   Medicare Attestation I have personally reviewed: The patient's medical and social history Their use of alcohol, tobacco or illicit drugs Their current medications and supplements The patient's functional ability including ADLs,fall risks, home safety risks, cognitive, and hearing and visual impairment Diet and physical activities Evidence for depression or mood disorders  The patient's weight, height, BMI, and visual acuity have been recorded in the chart.  I have made referrals, counseling, and provided education to the patient based on review of the above and I have provided the patient with a written personalized care plan for preventive services.     Vicie Mutters, PA-C   02/10/18

## 2018-02-10 ENCOUNTER — Encounter: Payer: Self-pay | Admitting: Physician Assistant

## 2018-02-10 ENCOUNTER — Ambulatory Visit: Payer: Medicare Other | Admitting: Physician Assistant

## 2018-02-10 VITALS — BP 130/100 | HR 66 | Temp 98.7°F | Ht 66.0 in | Wt 179.0 lb

## 2018-02-10 DIAGNOSIS — Z0001 Encounter for general adult medical examination with abnormal findings: Secondary | ICD-10-CM | POA: Diagnosis not present

## 2018-02-10 DIAGNOSIS — E039 Hypothyroidism, unspecified: Secondary | ICD-10-CM | POA: Diagnosis not present

## 2018-02-10 DIAGNOSIS — Z0000 Encounter for general adult medical examination without abnormal findings: Secondary | ICD-10-CM

## 2018-02-10 DIAGNOSIS — F419 Anxiety disorder, unspecified: Secondary | ICD-10-CM

## 2018-02-10 DIAGNOSIS — R6889 Other general symptoms and signs: Secondary | ICD-10-CM | POA: Diagnosis not present

## 2018-02-10 DIAGNOSIS — E559 Vitamin D deficiency, unspecified: Secondary | ICD-10-CM | POA: Diagnosis not present

## 2018-02-10 DIAGNOSIS — D709 Neutropenia, unspecified: Secondary | ICD-10-CM

## 2018-02-10 DIAGNOSIS — E782 Mixed hyperlipidemia: Secondary | ICD-10-CM | POA: Diagnosis not present

## 2018-02-10 DIAGNOSIS — T7840XD Allergy, unspecified, subsequent encounter: Secondary | ICD-10-CM

## 2018-02-10 DIAGNOSIS — Z79899 Other long term (current) drug therapy: Secondary | ICD-10-CM

## 2018-02-10 NOTE — Patient Instructions (Signed)
Laryngitis Laryngitis is inflammation of your vocal cords. This causes hoarseness, coughing, loss of voice, sore throat, or a dry throat. Your vocal cords are two bands of muscles that are found in your throat. When you speak, these cords come together and vibrate. These vibrations come out through your mouth as sound. When your vocal cords are inflamed, your voice sounds different. Laryngitis can be temporary (acute) or long-term (chronic). Most cases of acute laryngitis improve with time. Chronic laryngitis is laryngitis that lasts for more than three weeks. What are the causes? Acute laryngitis may be caused by:  A viral infection.  Lots of talking, yelling, or singing. This is also called vocal strain.  Bacterial infections.  Chronic laryngitis may be caused by:  Vocal strain.  Injury to your vocal cords.  Acid reflux (gastroesophageal reflux disease or GERD).  Allergies.  Sinus infection.  Smoking.  Alcohol abuse.  Breathing in chemicals or dust.  Growths on the vocal cords.  What increases the risk? Risk factors for laryngitis include:  Smoking.  Alcohol abuse.  Having allergies.  What are the signs or symptoms? Symptoms of laryngitis may include:  Low, hoarse voice.  Loss of voice.  Dry cough.  Sore throat.  Stuffy nose.  How is this diagnosed? Laryngitis may be diagnosed by:  Physical exam.  Throat culture.  Blood test.  Laryngoscopy. This procedure allows your health care provider to look at your vocal cords with a mirror or viewing tube.  How is this treated? Treatment for laryngitis depends on what is causing it. Usually, treatment involves resting your voice and using medicines to soothe your throat. However, if your laryngitis is caused by a bacterial infection, you may need to take antibiotic medicine. If your laryngitis is caused by a growth, you may need to have a procedure to remove it. Follow these instructions at home:  Drink  enough fluid to keep your urine clear or pale yellow.  Breathe in moist air. Use a humidifier if you live in a dry climate.  Take medicines only as directed by your health care provider.  If you were prescribed an antibiotic medicine, finish it all even if you start to feel better.  Do not smoke cigarettes or electronic cigarettes. If you need help quitting, ask your health care provider.  Talk as little as possible. Also avoid whispering, which can cause vocal strain.  Write instead of talking. Do this until your voice is back to normal. Contact a health care provider if:  You have a fever.  You have increasing pain.  You have difficulty swallowing. Get help right away if:  You cough up blood.  You have trouble breathing. This information is not intended to replace advice given to you by your health care provider. Make sure you discuss any questions you have with your health care provider. Document Released: 02/25/2005 Document Revised: 08/03/2015 Document Reviewed: 08/10/2013 Elsevier Interactive Patient Education  2018 [employer] Inc[employer]  

## 2018-06-17 ENCOUNTER — Encounter: Payer: Self-pay | Admitting: Certified Nurse Midwife

## 2018-08-19 ENCOUNTER — Encounter: Payer: Self-pay | Admitting: Physician Assistant

## 2018-09-01 ENCOUNTER — Ambulatory Visit: Payer: Medicare Other | Admitting: Certified Nurse Midwife

## 2018-09-12 IMAGING — CT CT HEAD W/O CM
3 of 4 series · 14 of 47 positions shown, 16 images · non-contrast
Comparison: None.

CLINICAL DATA: Acute onset of headache and neck pain. Nausea,
vomiting and diarrhea. Initial encounter.

EXAM:
CT HEAD WITHOUT CONTRAST
TECHNIQUE: Contiguous axial images were obtained from the base of the skull
through the vertex without intravenous contrast.

[Series 2: head w/o · axial · non-contrast · 0.45mm/px · z∈[+1540,+1660]mm · 8 of 30 slices shown, 10 images]
[im 3/30  brain]
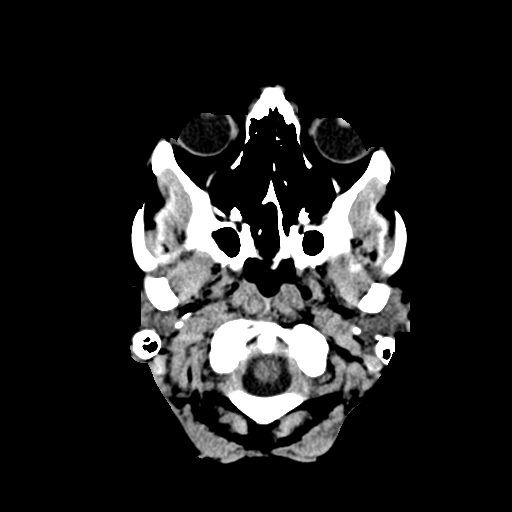
[im 3/30  bone]
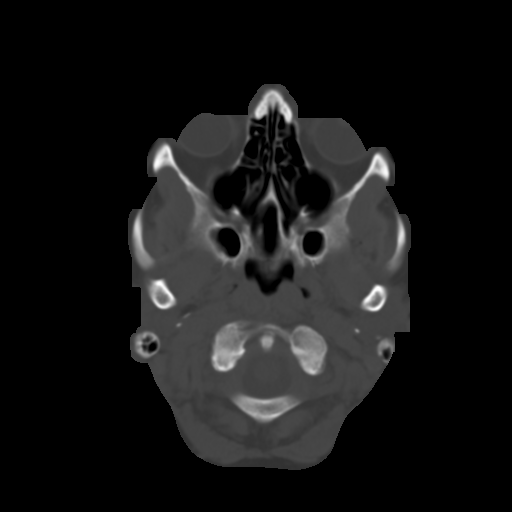
[im 7/30  brain]
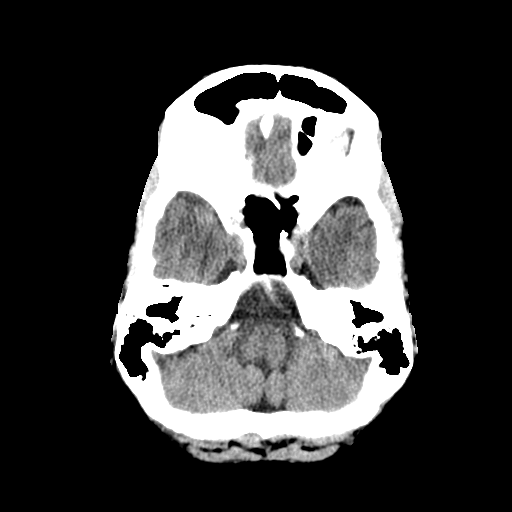
[im 11/30  brain]
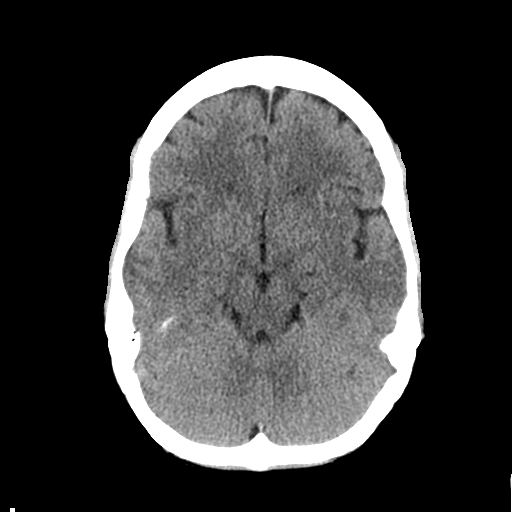
[im 13/30  brain]
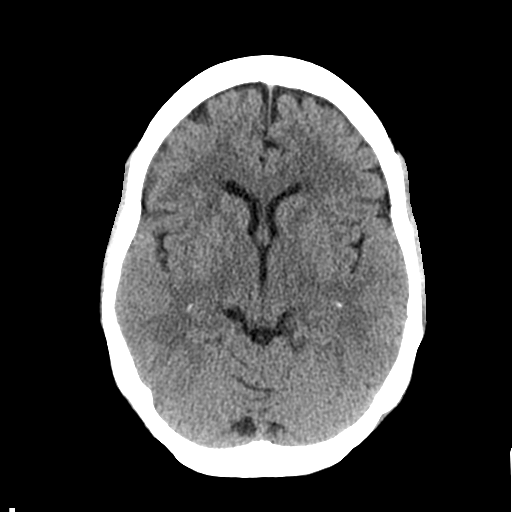
[im 17/30  brain]
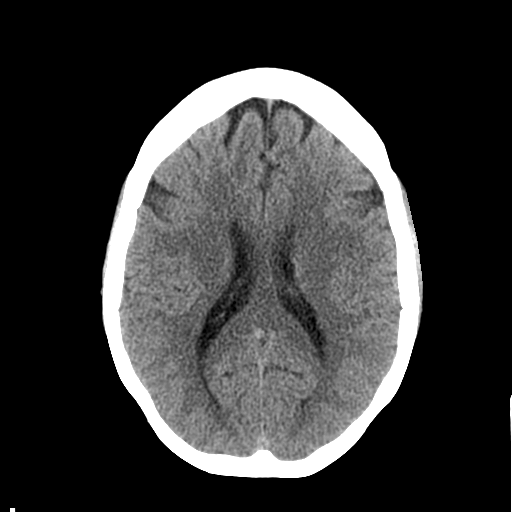
[im 17/30  bone]
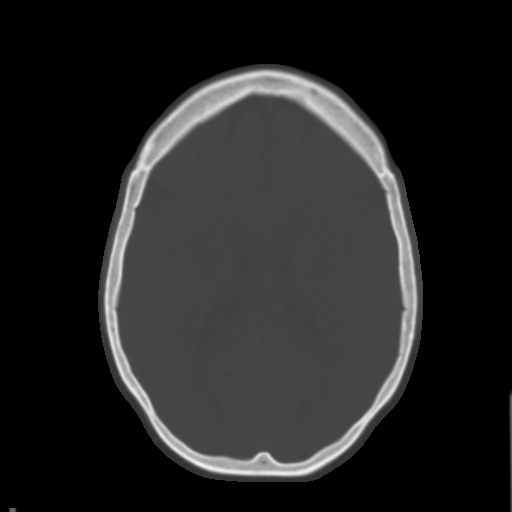
[im 19/30  brain]
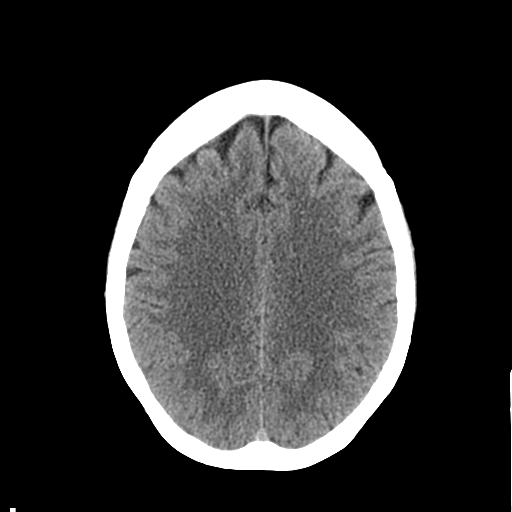
[im 23/30  brain]
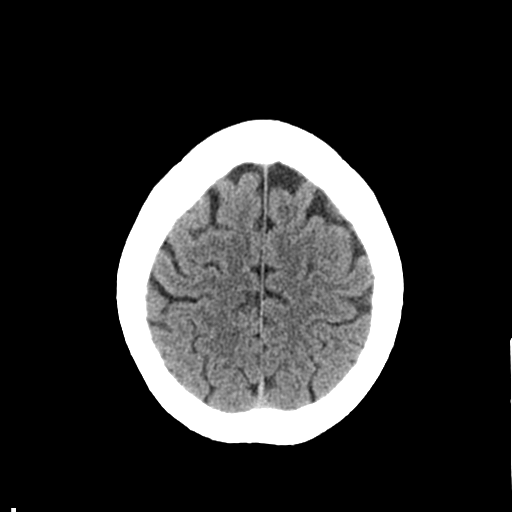
[im 27/30  brain]
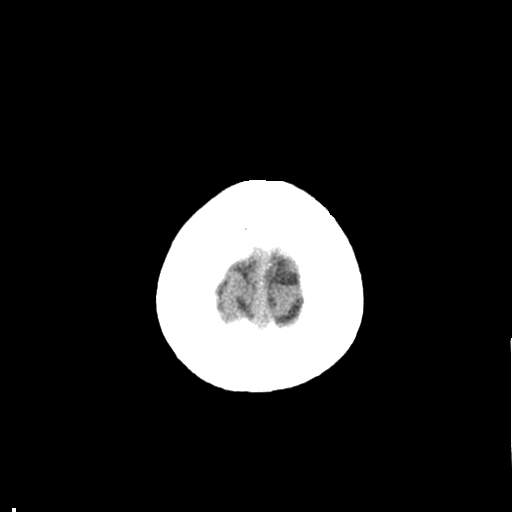

[Series 4: coronal · coronal · 0.29mm/px · 3 of 77 slices shown]
[im 26/77  brain]
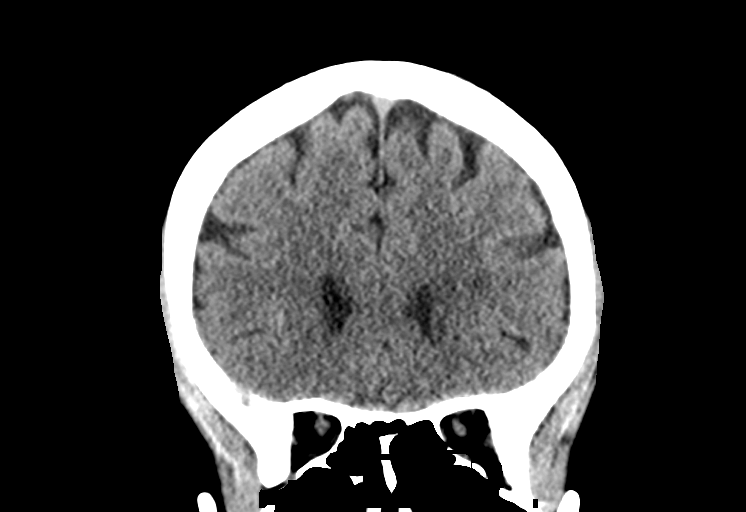
[im 34/77  brain]
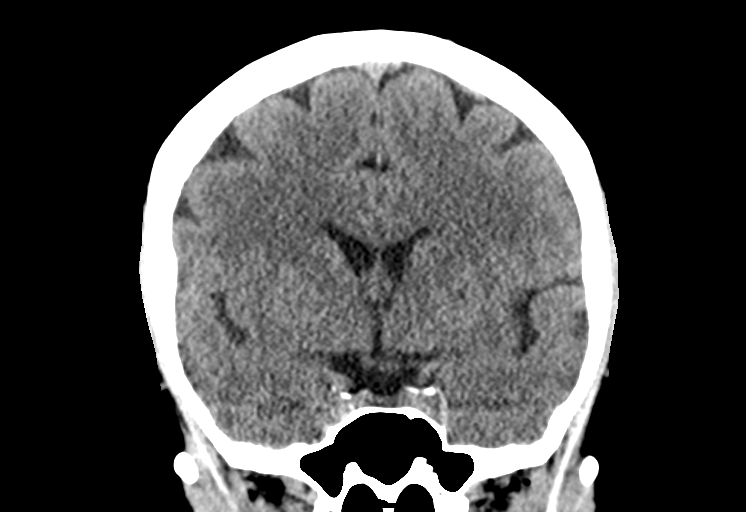
[im 43/77  brain]
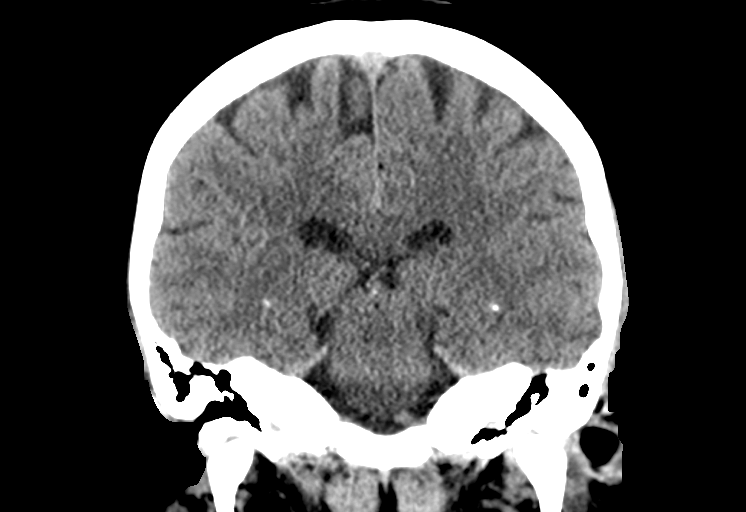

[Series 5: sagittal · sagittal · 0.29mm/px · 3 of 77 slices shown]
[im 26/77  brain]
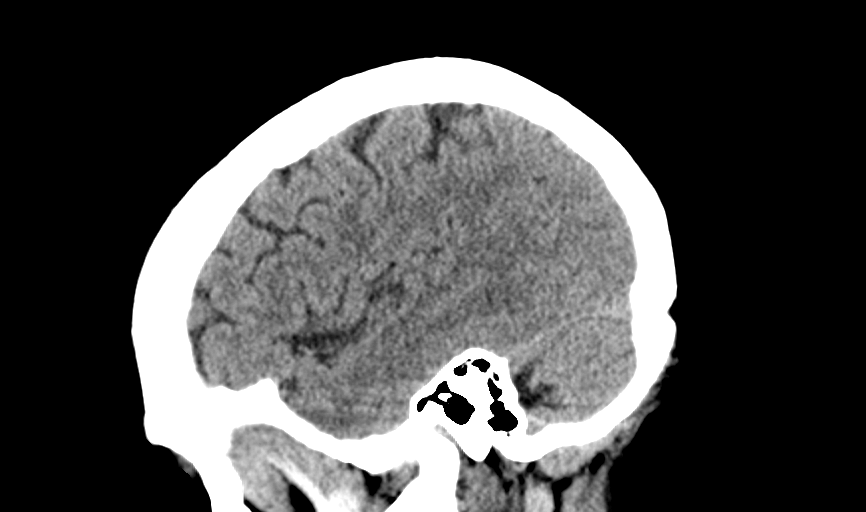
[im 39/77  brain]
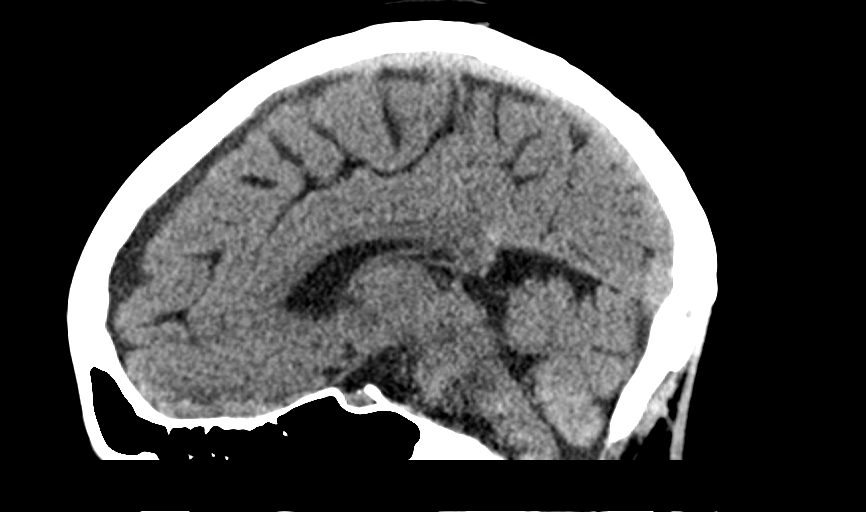
[im 51/77  brain]
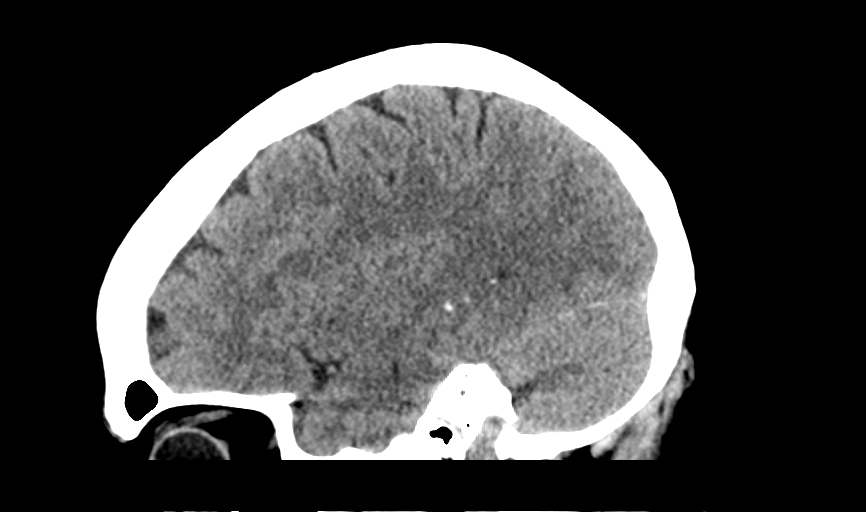

[14 of 47 positions shown; findings below may reference images not displayed]

FINDINGS: Brain: No evidence of acute infarction, hemorrhage, hydrocephalus,
extra-axial collection or mass lesion/mass effect.

The posterior fossa, including the cerebellum, brainstem and fourth
ventricle, is within normal limits. The third and lateral
ventricles, and basal ganglia are unremarkable in appearance. The
cerebral hemispheres are symmetric in appearance, with normal
gray-white differentiation. No mass effect or midline shift is seen.

Vascular: No hyperdense vessel or unexpected calcification.

Skull: There is no evidence of fracture; visualized osseous
structures are unremarkable in appearance.

Sinuses/Orbits: The visualized portions of the orbits are within
normal limits. The paranasal sinuses and mastoid air cells are
well-aerated.

Other: No significant soft tissue abnormalities are seen.
IMPRESSION: Unremarkable noncontrast CT of the head.

## 2018-11-12 NOTE — Progress Notes (Signed)
WELLNESS AND FOLLOW UP  Assessment:   Encounter for Medicare annual wellness exam 1 year  Neutropenia, unspecified type ([place]) -     CBC with Differential/Platelet  Mixed hyperlipidemia -     Lipid panel -     EKG 12-Lead check lipids decrease fatty foods increase activity.   Medication management -     CBC with Differential/Platelet -     COMPLETE METABOLIC PANEL WITH GFR -     Magnesium  Vitamin D deficiency -     VITAMIN D 25 Hydroxy (Vit-D Deficiency, Fractures)  Hypothyroidism, unspecified type -     TSH Hypothyroidism-check TSH level, continue medications the same, reminded to take on an empty stomach 30-61mins before food.   Allergic state, subsequent encounter Continue to monitor  Anxiety Monitor  Screening for diabetes mellitus -     Hemoglobin A1c  Screening for hematuria or proteinuria -     Urinalysis, Routine w reflex microscopic -     Microalbumin / creatinine urine ratio  Systolic murmur No symptoms Sister with history of mitral prolapse Will monitor  Needs flu shot -     Flu vaccine HIGH DOSE PF     Future Appointments  Date Time Provider [place]  02/15/2019  3:45 PM Vicie Mutters, PA-C [facility] None  11/18/2019 10:00 AM Vicie Mutters, PA-C [facility] None     Subjective:   Kimberly Bowers is a 68 y.o. female who presents for wellness and 3 month follow up on hypertension, hypothyroidism, obesity, hyperlipidemia, vitamin D def.   Her blood pressure has been controlled at home, BP: 122/86 She is on prednisone and mucinex D so it has been elevated.   BMI is Body mass index is 35.64 kg/m., she is working on diet and exercise. Wt Readings from Last 3 Encounters:  11/18/18 214 lb 3.2 oz (97.2 kg)  02/10/18 179 lb (81.2 kg)  11/27/17 180 lb (81.6 kg)   She does not workout as much as before, she is walking daily.   She denies chest pain, shortness of breath, dizziness.  She is not on cholesterol medication and denies  myalgias. Her cholesterol is at goal. The cholesterol last visit was:   Lab Results  Component Value Date   CHOL 226 (H) 11/01/2016   HDL 90 11/01/2016   LDLCALC 120 (H) 11/01/2016   TRIG 66 11/01/2016   CHOLHDL 2.5 11/01/2016   Last A1C in the office was:  Lab Results  Component Value Date   HGBA1C 5.2 08/05/2013   Patient is on Vitamin D supplement.   Has history of diverticulitis, has increased fiber.   She is on thyroid medication. Her medication was not changed last visit. She is on 142mcg every day.  Lab Results  Component Value Date   TSH 2.48 11/01/2016   BMI is Body mass index is 35.64 kg/m., she is working on diet and exercise. Wt Readings from Last 3 Encounters:  11/18/18 214 lb 3.2 oz (97.2 kg)  02/10/18 179 lb (81.2 kg)  11/27/17 180 lb (81.6 kg)    Names of Other Physician/Practitioners you currently use: 1. [facility]- here for primary care 2. Dr. [place], eye doctor, last visit Jan 2019 3. Dr. Orvil Feil, dentist, last visit q 6 months ago 4. [place] in Jan 2019 cataract Patient Care Team: Vicie Mutters, PA-C as PCP - General (Physician Assistant) Sable Feil, MD as Consulting Physician (Gastroenterology) Elsie Saas, MD as Consulting Physician (Orthopedic Surgery) Megan Salon, MD  as Consulting Physician (Gynecology)   Medication Review Current Outpatient Medications on File Prior to Visit  Medication Sig Dispense Refill  . B Complex Vitamins (B COMPLEX PO) Take by mouth.    . Cholecalciferol (VITAMIN D) 2000 UNITS tablet Take 2,000 Units by mouth daily.    . diclofenac (VOLTAREN) 75 MG EC tablet Take 75 mg by mouth 2 (two) times daily with a meal.  4  . fexofenadine (ALLEGRA) 60 MG tablet Take 60 mg by mouth daily as needed for allergies or rhinitis.    Marland Kitchen levothyroxine (SYNTHROID) 100 MCG tablet Take ONE tablet daily or as DIRECTED by PROVIDER. 90 tablet 3  . MAGNESIUM PO Take by mouth daily.    .  mometasone (NASONEX) 50 MCG/ACT nasal spray instill 2 sprays into each nostril once daily 17 g 3  . Wheat Dextrin (BENEFIBER) POWD Take by mouth 2 (two) times daily.     Current Facility-Administered Medications on File Prior to Visit  Medication Dose Route Frequency Provider Last Rate Last Dose  . 0.9 %  sodium chloride infusion  500 mL Intravenous Once Nandigam, Venia Minks, MD        Current Problems (verified) Patient Active Problem List   Diagnosis Date Noted  . Vitamin D deficiency 03/17/2014  . Medication management 03/17/2014  . Mixed hyperlipidemia 03/25/2013  . Anxiety   . Hypothyroid   . Neutropenia ([place])   . Allergy     Screening Tests Immunization History  Administered Date(s) Administered  . Influenza Split 12/08/2013  . Influenza, High Dose Seasonal PF 11/19/2014, 11/01/2016, 12/19/2017  . Influenza-Unspecified 12/29/2012, 12/19/2017  . Pneumococcal Conjugate-13 09/05/2014  . Pneumococcal Polysaccharide-23 11/02/2015  . Td 03/11/2004  . Tdap 07/08/2012  . Zoster 07/08/2012    Preventative care: Last colonoscopy: 2019 EGD: 2009 Last mammogram: 12/2017- had left BENIGN breast biopsy Last pap smear/pelvic exam: 07/2015 DEXA:04/2015 CXR 2006 CT head 12/2016  Prior vaccinations: TD or Tdap: 2014  Influenza: 2019 DUE Pneumococcal: 2017 Prevnar13: 2016 Shingles/Zostavax: 2014 need 2nd one  History reviewed: allergies, current medications, past family history, past medical history, past social history, past surgical history and problem list  Allergies No Known Allergies  SURGICAL HISTORY She  has a past surgical history that includes Laparoscopic gastric banding (2004); Knee arthroscopy (Left); Uterine fibroid surgery (2001); Foot surgery (Right); Tubal ligation; Laparoscopic repair and removal of gastric band; Brachioplasty (03/2016); Cataract extraction (2019); and Breast biopsy (Left, 01/2018). FAMILY HISTORY Her family history includes Cancer in her  mother; Diabetes in her father. SOCIAL HISTORY She  reports that she has never smoked. She has never used smokeless tobacco. She reports current alcohol use of about 1.0 - 2.0 standard drinks of alcohol per week. She reports that she does not use drugs.  MEDICARE WELLNESS OBJECTIVES: Physical activity:   Cardiac risk factors: Cardiac Risk Factors include: advanced age (>5men, >58 women);dyslipidemia;hypertension;sedentary lifestyle;obesity (BMI >30kg/m2) Depression/mood screen:   Depression screen [facility] 2/9 02/10/2018  Decreased Interest 0  Down, Depressed, Hopeless 0  PHQ - 2 Score 0    ADLs:  In your present state of health, do you have any difficulty performing the following activities: 11/18/2018 02/10/2018  Hearing? N N  Vision? N N  Difficulty concentrating or making decisions? N N  Walking or climbing stairs? N N  Dressing or bathing? N N  Doing errands, shopping? N N  Some recent data might be hidden     Cognitive Testing  Alert? Yes  Normal Appearance?Yes  Oriented to  person? Yes  Place? Yes   Time? Yes  Recall of three objects?  Yes  Can perform simple calculations? Yes  Displays appropriate judgment?Yes  Can read the correct time from a watch face?Yes  EOL planning: Does Patient Have a Medical Advance Directive?: No, Yes Type of Advance Directive: Living will, Healthcare Power of Attorney Does patient want to make changes to medical advance directive?: Yes (MAU/Ambulatory/Procedural Areas - Information given)    Review of Systems:  Review of Systems  Constitutional: Negative.   HENT: Negative.   Eyes: Negative.   Respiratory: Negative.   Cardiovascular: Negative.   Gastrointestinal: Negative.   Genitourinary: Negative.   Musculoskeletal: Negative.   Skin: Negative.   Neurological: Negative.   Endo/Heme/Allergies: Negative.   Psychiatric/Behavioral: The patient does not have insomnia.     Objective:   Today's Vitals   11/18/18 0950  BP: 122/86  Pulse:  (!) 101  Temp: 97.6 F (36.4 C)  SpO2: 99%  Weight: 214 lb 3.2 oz (97.2 kg)  Height: 5\' 5"  (1.651 m)    General appearance: alert, no distress, WD/WN,  female HEENT: normocephalic, sclerae anicteric, TMs pearly, nares patent, no discharge or erythema, pharynx normal Oral cavity: MMM, no lesions Neck: supple, no lymphadenopathy, no thyromegaly, no masses Heart: RRR, normal S1, S2, prominent S2 versus late systolic murmur, no radiation to carotids Lungs: CTA bilaterally, no wheezes, rhonchi, or rales Abdomen: +bs, soft, non tender, non distended, no masses, no hepatomegaly, no splenomegaly Musculoskeletal: nontender, no swelling, no obvious deformity 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  Extremities: no edema, no cyanosis, no clubbing Pulses: 2+ symmetric, upper and lower extremities, normal cap refill Neurological: alert, oriented x 3, CN2-12 intact, strength normal upper extremities and lower extremities, sensation normal throughout, DTRs 2+ throughout, no cerebellar signs, gait normal Psychiatric: normal affect, behavior normal, pleasant  Breast: defer Gyn: defer Rectal: defer  EKG: WNL no ST changes  Medicare Attestation I have personally reviewed: The patient's medical and social history Their use of alcohol, tobacco or illicit drugs Their current medications and supplements The patient's functional ability including ADLs,fall risks, home safety risks, cognitive, and hearing and visual impairment Diet and physical activities Evidence for depression or mood disorders  The patient's weight, height, BMI, and visual acuity have been recorded in the chart.  I have made referrals, counseling, and provided education to the patient based on review of the above and I have provided the patient with a written personalized care plan for preventive services.     Vicie Mutters, PA-C   11/18/18

## 2018-11-18 ENCOUNTER — Encounter: Payer: Self-pay | Admitting: Physician Assistant

## 2018-11-18 ENCOUNTER — Ambulatory Visit: Payer: Medicare Other | Admitting: Physician Assistant

## 2018-11-18 ENCOUNTER — Other Ambulatory Visit: Payer: Self-pay

## 2018-11-18 VITALS — BP 122/86 | HR 101 | Temp 97.6°F | Ht 65.0 in | Wt 214.2 lb

## 2018-11-18 DIAGNOSIS — E039 Hypothyroidism, unspecified: Secondary | ICD-10-CM

## 2018-11-18 DIAGNOSIS — Z0001 Encounter for general adult medical examination with abnormal findings: Secondary | ICD-10-CM | POA: Diagnosis not present

## 2018-11-18 DIAGNOSIS — Z23 Encounter for immunization: Secondary | ICD-10-CM

## 2018-11-18 DIAGNOSIS — E559 Vitamin D deficiency, unspecified: Secondary | ICD-10-CM

## 2018-11-18 DIAGNOSIS — Z79899 Other long term (current) drug therapy: Secondary | ICD-10-CM

## 2018-11-18 DIAGNOSIS — F419 Anxiety disorder, unspecified: Secondary | ICD-10-CM

## 2018-11-18 DIAGNOSIS — D709 Neutropenia, unspecified: Secondary | ICD-10-CM | POA: Diagnosis not present

## 2018-11-18 DIAGNOSIS — Z131 Encounter for screening for diabetes mellitus: Secondary | ICD-10-CM

## 2018-11-18 DIAGNOSIS — Z0000 Encounter for general adult medical examination without abnormal findings: Secondary | ICD-10-CM

## 2018-11-18 DIAGNOSIS — T7840XD Allergy, unspecified, subsequent encounter: Secondary | ICD-10-CM

## 2018-11-18 DIAGNOSIS — Z1389 Encounter for screening for other disorder: Secondary | ICD-10-CM

## 2018-11-18 DIAGNOSIS — E782 Mixed hyperlipidemia: Secondary | ICD-10-CM

## 2018-11-18 DIAGNOSIS — R6889 Other general symptoms and signs: Secondary | ICD-10-CM

## 2018-11-18 DIAGNOSIS — R011 Cardiac murmur, unspecified: Secondary | ICD-10-CM

## 2018-11-18 NOTE — Patient Instructions (Addendum)
The testing sites are open from 8-4, Monday-Friday. Due to the testing being walk-up/drive-up the sites request that the pt's are in line to have testing by 3:30 pm. The pt's will remain in the car and wear a mask when going for testing.The staff will come to the car to perform testing. The pt's will need to bring an ID and insurance card if they have one.   [place] ([place])  [facility]   The testing sites are also listed on the [employer] as well.    Check out  Mini habits for weight loss book  2 apps for tracking food is myfitness pal  loseit OR can take picture of your food    Drink 1/2 your body weight in fluid ounces of water daily; drink a tall glass of water 30 min before meals  Don't eat until you're stuffed- listen to your stomach and eat until you are 80% full   Try eating off of a salad plate; wait 10 min after finishing before going back for seconds  Start by eating the vegetables on your plate; aim for 50% of your meals to be fruits or vegetables  Then eat your protein - lean meats (grass fed if possible), fish, beans, nuts in moderation  Eat your carbs/starch last ONLY if you still are hungry. If you can, stop before finishing it all  Avoid sugar and flour - the closer it looks to it's original form in nature, typically the better it is for you  Splurge in moderation - "assign" days when you get to splurge and have the "bad stuff" - I like to follow a 80% - 20% plan- "good" choices 80 % of the time, "bad" choices in moderation 20% of the time  Simple equation is: Calories out > calories in = weight loss - even if you eat the bad stuff, if you limit portions, you will still lose weight   Ask pharmacy about shingrix - new vaccine   Can go to [employer].com.br for more  information  Shingrix Vaccination  Two vaccines are licensed and recommended to prevent shingles in the [place] Zoster vaccine live (ZVL, Zostavax) has been in use since 2006. Recombinant zoster vaccine (RZV, Shingrix), has been in use since 2017 and is recommended by ACIP as the preferred shingles vaccine.  What Everyone Should Know about Shingles Vaccine (Shingrix) One of the Recommended Vaccines by Disease Shingles vaccination is the only way to protect against shingles and postherpetic neuralgia (PHN), the most common complication from shingles. CDC recommends that healthy adults 50 years and older get two doses of the shingles vaccine called Shingrix (recombinant zoster vaccine), separated by 2 to 6 months, to prevent shingles and the complications from the disease. Your doctor or pharmacist can give you Shingrix as a shot in your upper arm. Shingrix provides strong protection against shingles and PHN. Two doses of Shingrix is more than 90% effective at preventing shingles and PHN. Protection stays above 85% for at least the first four years after you get vaccinated. Shingrix is the preferred vaccine, over Zostavax (zoster vaccine live), a shingles vaccine in use since 2006. Zostavax may still be used to prevent shingles in healthy adults 60 years and older. For example, you could use Zostavax if a person is allergic to Shingrix, prefers Zostavax, or requests immediate vaccination and Shingrix is unavailable. Who Should Get Shingrix? Healthy adults 50 years  and older should get two doses of Shingrix, separated by 2 to 6 months. You should get Shingrix even if in the past you . had shingles  . received Zostavax  . are not sure if you had chickenpox There is no maximum age for getting Shingrix. If you had shingles in the past, you can get Shingrix to help prevent future occurrences of the disease. There is no specific length of time that you need to wait after having shingles before you can receive  Shingrix, but generally you should make sure the shingles rash has gone away before getting vaccinated. You can get Shingrix whether or not you remember having had chickenpox in the past. Studies show that more than 99% of Americans 40 years and older have had chickenpox, even if they don't remember having the disease. Chickenpox and shingles are related because they are caused by the same virus (varicella zoster virus). After a person recovers from chickenpox, the virus stays dormant (inactive) in the body. It can reactivate years later and cause shingles. If you had Zostavax in the recent past, you should wait at least eight weeks before getting Shingrix. Talk to your healthcare provider to determine the best time to get Shingrix. Shingrix is available in [facility] and pharmacies. To find doctor's offices or pharmacies near you that offer the vaccine, visit HealthMap Vaccine FinderExternal. If you have questions about Shingrix, talk with your healthcare provider. Vaccine for Those 8 Years and Older  Shingrix reduces the risk of shingles and PHN by more than 90% in people 5 and older. CDC recommends the vaccine for healthy adults 15 and older.  Who Should Not Get Shingrix? You should not get Shingrix if you: . have ever had a severe allergic reaction to any component of the vaccine or after a dose of Shingrix  . tested negative for immunity to varicella zoster virus. If you test negative, you should get chickenpox vaccine.  . currently have shingles  . currently are pregnant or breastfeeding. Women who are pregnant or breastfeeding should wait to get Shingrix.  Marland Kitchen receive specific antiviral drugs (acyclovir, famciclovir, or valacyclovir) 24 hours before vaccination (avoid use of these antiviral drugs for 14 days after vaccination)- zoster vaccine live only If you have a minor acute (starts suddenly) illness, such as a cold, you may get Shingrix. But if you have a moderate or severe acute  illness, you should usually wait until you recover before getting the vaccine. This includes anyone with a temperature of 101.64F or higher. The side effects of the Shingrix are temporary, and usually last 2 to 3 days. While you may experience pain for a few days after getting Shingrix, the pain will be less severe than having shingles and the complications from the disease. How Well Does Shingrix Work? Two doses of Shingrix provides strong protection against shingles and postherpetic neuralgia (PHN), the most common complication of shingles. . In adults 25 to 70 years old who got two doses, Shingrix was 97% effective in preventing shingles; among adults 70 years and older, Shingrix was 91% effective.  . In adults 105 to 70 years old who got two doses, Shingrix was 91% effective in preventing PHN; among adults 70 years and older, Shingrix was 89% effective. Shingrix protection remained high (more than 85%) in people 70 years and older throughout the four years following vaccination. Since your risk of shingles and PHN increases as you get older, it is important to have strong protection against shingles in your  older years. Top of Page  What Are the Possible Side Effects of Shingrix? Studies show that Shingrix is safe. The vaccine helps your body create a strong defense against shingles. As a result, you are likely to have temporary side effects from getting the shots. The side effects may affect your ability to do normal daily activities for 2 to 3 days. Most people got a sore arm with mild or moderate pain after getting Shingrix, and some also had redness and swelling where they got the shot. Some people felt tired, had muscle pain, a headache, shivering, fever, stomach pain, or nausea. About 1 out of 6 people who got Shingrix experienced side effects that prevented them from doing regular activities. Symptoms went away on their own in about 2 to 3 days. Side effects were more common in younger  people. You might have a reaction to the first or second dose of Shingrix, or both doses. If you experience side effects, you may choose to take over-the-counter pain medicine such as ibuprofen or acetaminophen. If you experience side effects from Shingrix, you should report them to the Vaccine Adverse Event Reporting System (VAERS). Your doctor might file this report, or you can do it yourself through the VAERS websiteExternal, or by calling [phone]. If you have any questions about side effects from Shingrix, talk with your doctor. The shingles vaccine does not contain thimerosal (a preservative containing mercury). Top of Page  When Should I See a Doctor Because of the Side Effects I Experience From Shingrix? In clinical trials, Shingrix was not associated with serious adverse events. In fact, serious side effects from vaccines are extremely rare. For example, for every 1 million doses of a vaccine given, only one or two people may have a severe allergic reaction. Signs of an allergic reaction happen within minutes or hours after vaccination and include hives, swelling of the face and throat, difficulty breathing, a fast heartbeat, dizziness, or weakness. If you experience these or any other life-threatening symptoms, see a doctor right away. Shingrix causes a strong response in your immune system, so it may produce short-term side effects more intense than you are used to from other vaccines. These side effects can be uncomfortable, but they are expected and usually go away on their own in 2 or 3 days. Top of Page  How Can I Pay For Shingrix? There are several ways shingles vaccine may be paid for: Medicare . Medicare Part D plans cover the shingles vaccine, but there may be a cost to you depending on your plan. There may be a copay for the vaccine, or you may need to pay in full then get reimbursed for a certain amount.  . Medicare Part B does not cover the shingles  vaccine. Medicaid . Medicaid may or may not cover the vaccine. Contact your insurer to find out. Private health insurance . Many private health insurance plans will cover the vaccine, but there may be a cost to you depending on your plan. Contact your insurer to find out. Vaccine assistance programs . Some pharmaceutical companies provide vaccines to eligible adults who cannot afford them. You may want to check with the vaccine manufacturer, [employer], about Shingrix. If you do not currently have health insurance, learn more about affordable health coverage optionsExternal. To find doctor's offices or pharmacies near you that offer the vaccine, visit HealthMap Vaccine FinderExternal.

## 2018-11-19 LAB — COMPLETE METABOLIC PANEL WITH GFR
AG Ratio: 2.3 (calc) (ref 1.0–2.5)
ALT: 13 U/L (ref 6–29)
AST: 24 U/L (ref 10–35)
Albumin: 4.5 g/dL (ref 3.6–5.1)
Alkaline phosphatase (APISO): 45 U/L (ref 37–153)
BUN: 17 mg/dL (ref 7–25)
CO2: 26 mmol/L (ref 20–32)
Calcium: 9.4 mg/dL (ref 8.6–10.4)
Chloride: 104 mmol/L (ref 98–110)
Creat: 0.84 mg/dL (ref 0.60–0.93)
GFR, Est African American: 82 mL/min/{1.73_m2} (ref >=60)
GFR, Est Non African American: 70 mL/min/{1.73_m2} (ref >=60)
Globulin: 2 g/dL (calc) (ref 1.9–3.7)
Glucose, Bld: 93 mg/dL (ref 65–99)
Potassium: 4.2 mmol/L (ref 3.5–5.3)
Sodium: 140 mmol/L (ref 135–146)
Total Bilirubin: 0.6 mg/dL (ref 0.2–1.2)
Total Protein: 6.5 g/dL (ref 6.1–8.1)

## 2018-11-19 LAB — URINALYSIS, ROUTINE W REFLEX MICROSCOPIC
Bacteria, UA: NONE SEEN /HPF
Glucose, UA: NEGATIVE
Hgb urine dipstick: NEGATIVE
Nitrite: NEGATIVE
Protein, ur: NEGATIVE
Specific Gravity, Urine: 1.024 (ref 1.001–1.03)
WBC, UA: NONE SEEN /HPF (ref 0–5)
pH: <=5 (ref 5.0–8.0)

## 2018-11-19 LAB — LIPID PANEL
Cholesterol: 222 mg/dL — ABNORMAL HIGH (ref <200)
HDL: 75 mg/dL (ref >=50)
LDL Cholesterol (Calc): 131 mg/dL (calc) — ABNORMAL HIGH
Non-HDL Cholesterol (Calc): 147 mg/dL (calc) — ABNORMAL HIGH (ref <130)
Total CHOL/HDL Ratio: 3 (calc) (ref <5.0)
Triglycerides: 65 mg/dL (ref <150)

## 2018-11-19 LAB — MICROALBUMIN / CREATININE URINE RATIO
Creatinine, Urine: 345 mg/dL — ABNORMAL HIGH (ref 20–275)
Microalb Creat Ratio: 6 mcg/mg creat (ref <30)
Microalb, Ur: 2.2 mg/dL

## 2018-11-19 LAB — CBC WITH DIFFERENTIAL/PLATELET
Absolute Monocytes: 285 cells/uL (ref 200–950)
Basophils Absolute: 52 cells/uL (ref 0–200)
Basophils Relative: 1.4 %
Eosinophils Absolute: 70 cells/uL (ref 15–500)
Eosinophils Relative: 1.9 %
HCT: 43.8 % (ref 35.0–45.0)
Hemoglobin: 14.9 g/dL (ref 11.7–15.5)
Lymphs Abs: 810 cells/uL — ABNORMAL LOW (ref 850–3900)
MCH: 33 pg (ref 27.0–33.0)
MCHC: 34 g/dL (ref 32.0–36.0)
MCV: 97.1 fL (ref 80.0–100.0)
MPV: 9.9 fL (ref 7.5–12.5)
Monocytes Relative: 7.7 %
Neutro Abs: 2483 cells/uL (ref 1500–7800)
Neutrophils Relative %: 67.1 %
Platelets: 273 10*3/uL (ref 140–400)
RBC: 4.51 10*6/uL (ref 3.80–5.10)
RDW: 12.6 % (ref 11.0–15.0)
Total Lymphocyte: 21.9 %
WBC: 3.7 10*3/uL — ABNORMAL LOW (ref 3.8–10.8)

## 2018-11-19 LAB — VITAMIN D 25 HYDROXY (VIT D DEFICIENCY, FRACTURES): Vit D, 25-Hydroxy: 28 ng/mL — ABNORMAL LOW (ref 30–100)

## 2018-11-19 LAB — HEMOGLOBIN A1C
Hgb A1c MFr Bld: 4.9 % of total Hgb (ref <5.7)
Mean Plasma Glucose: 94 (calc)
eAG (mmol/L): 5.2 (calc)

## 2018-11-19 LAB — TSH: TSH: 3.32 mIU/L (ref 0.40–4.50)

## 2018-11-19 LAB — MAGNESIUM: Magnesium: 2.1 mg/dL (ref 1.5–2.5)

## 2018-11-24 ENCOUNTER — Other Ambulatory Visit: Payer: Self-pay | Admitting: Physician Assistant

## 2019-01-18 LAB — HM MAMMOGRAPHY

## 2019-01-19 ENCOUNTER — Encounter: Payer: Self-pay | Admitting: *Deleted

## 2019-02-15 ENCOUNTER — Ambulatory Visit: Payer: Self-pay | Admitting: Physician Assistant

## 2019-04-04 ENCOUNTER — Ambulatory Visit: Payer: Medicare PPO | Attending: Internal Medicine

## 2019-04-04 DIAGNOSIS — Z23 Encounter for immunization: Secondary | ICD-10-CM | POA: Insufficient documentation

## 2019-04-04 NOTE — Progress Notes (Signed)
   Covid-19 Vaccination Clinic  Name:  Kerly Wears    MRN: [id number] DOB: [birth year]  04/04/2019  Ms. Carvelli was observed post Covid-19 immunization for 15 minutes without incidence. She was provided with Vaccine Information Sheet and instruction to access the V-Safe system.   Ms. Chrysler was instructed to call 911 with any severe reactions post vaccine: Marland Kitchen Difficulty breathing  . Swelling of your face and throat  . A fast heartbeat  . A bad rash all over your body  . Dizziness and weakness    Immunizations Administered    Name Date Dose VIS Date Route   Pfizer COVID-19 Vaccine 04/04/2019 10:16 AM 0.3 mL 02/19/2019 Intramuscular   Manufacturer: [place]   Lot: [id number]   [place]: [id number]

## 2019-04-12 ENCOUNTER — Ambulatory Visit: Payer: Medicare Other

## 2019-04-23 ENCOUNTER — Ambulatory Visit: Payer: Medicare PPO | Attending: Internal Medicine

## 2019-04-23 DIAGNOSIS — Z23 Encounter for immunization: Secondary | ICD-10-CM

## 2019-04-23 NOTE — Progress Notes (Signed)
   Covid-19 Vaccination Clinic  Name:  Kimberly Bowers    MRN: [id number] DOB: [birth year]  04/23/2019  Kimberly Bowers was observed post Covid-19 immunization for 15 minutes without incidence. She was provided with Vaccine Information Sheet and instruction to access the V-Safe system.   Kimberly Bowers was instructed to call 911 with any severe reactions post vaccine: Marland Kitchen Difficulty breathing  . Swelling of your face and throat  . A fast heartbeat  . A bad rash all over your body  . Dizziness and weakness    Immunizations Administered    Name Date Dose VIS Date Route   Pfizer COVID-19 Vaccine 04/23/2019  9:00 AM 0.3 mL 02/19/2019 Intramuscular   Manufacturer: [place]   Lot: [id number]   [place]: [id number]

## 2019-05-25 ENCOUNTER — Other Ambulatory Visit: Payer: Self-pay | Admitting: Internal Medicine

## 2019-06-01 ENCOUNTER — Encounter: Payer: Self-pay | Admitting: Certified Nurse Midwife

## 2019-06-02 ENCOUNTER — Ambulatory Visit: Payer: Medicare Other | Admitting: Physician Assistant

## 2019-06-08 NOTE — Progress Notes (Signed)
WELLNESS AND FOLLOW UP  Assessment:   Encounter for Medicare annual wellness exam 1 year  Iron deficiency -     Iron,Total/Total Iron Binding Cap -     Ferritin  B12 deficiency -     Vitamin B12  Left knee pain, unspecified chronicity -     diclofenac (VOLTAREN) 75 MG EC tablet; Take 1 tablet (75 mg total) by mouth 2 (two) times daily with a meal.  Neutropenia, unspecified type (HCC) -     CBC with Differential/Platelet  Mixed hyperlipidemia -     Lipid panel -     EKG 12-Lead check lipids decrease fatty foods increase activity.   Medication management -     CBC with Differential/Platelet -     COMPLETE METABOLIC PANEL WITH GFR -     Magnesium  Vitamin D deficiency -     VITAMIN D 25 Hydroxy (Vit-D Deficiency, Fractures)  Hypothyroidism, unspecified type -     TSH Hypothyroidism-check TSH level, continue medications the same, reminded to take on an empty stomach 30-72mins before food.   Allergic state, subsequent encounter Continue to monitor  Anxiety Monitor  Systolic murmur No symptoms Sister with history of mitral prolapse Will monitor   Future Appointments  Date Time Provider [place]  11/25/2019  9:00 AM Vicie Mutters, PA-C [facility] None   Dr. Melford Aase did come into the visit, examined the patient and agreed with the assessment.     Subjective:   Kimberly Bowers is a 69 y.o. female who presents for wellness and 3 month follow up on hypertension, hypothyroidism, obesity, hyperlipidemia, vitamin D def.   Her blood pressure has been controlled at home, was 118/81 at home on wrist, BP: (!) 146/96.   BMI is Body mass index is 38.14 kg/m., she is working on diet and exercise. Wt Readings from Last 3 Encounters:  06/09/19 229 lb 3.2 oz (104 kg)  11/18/18 214 lb 3.2 oz (97.2 kg)  02/10/18 179 lb (81.2 kg)   She does not workout as much as before, she is walking daily.   She denies chest pain, shortness of breath, dizziness.  She is not  on cholesterol medication and denies myalgias. Her cholesterol is at goal. The cholesterol last visit was:   Lab Results  Component Value Date   CHOL 222 (H) 11/18/2018   HDL 75 11/18/2018   LDLCALC 131 (H) 11/18/2018   TRIG 65 11/18/2018   CHOLHDL 3.0 11/18/2018   Last A1C in the office was:  Lab Results  Component Value Date   HGBA1C 4.9 11/18/2018   Patient is on Vitamin D supplement.   Has history of diverticulitis, has increased fiber.   She is on thyroid medication. Her medication was not changed last visit. She is on 156mcg every day. She states she is tired all the time. She sleeps well other than she wakes up to urinate frequently, able to go back to sleep. In the morning she feels rested. She is not on a biotin.  Lab Results  Component Value Date   TSH 3.32 11/18/2018    Names of Other Physician/Practitioners you currently use: 1. [facility]- here for primary care 2. Dr. [place], eye doctor, last visit Jan 2019 3. Dr. Orvil Feil, dentist, last visit q 6 months ago 4. [place] in Jan 2019 cataract Patient Care Team: Vicie Mutters, PA-C as PCP - General (Physician Assistant) Sable Feil, MD as Consulting Physician (Gastroenterology) Elsie Saas, MD as Consulting Physician (Orthopedic  Surgery) Megan Salon, MD as Consulting Physician (Gynecology)   Medication Review  Current Outpatient Medications (Endocrine & Metabolic):  .  levothyroxine (SYNTHROID) 100 MCG tablet, TAKE ONE TABLET BY MOUTH ONE TIME DAILY WITH ONLY WATER ON EMPTY STOMACH *NO FOOD FOR 30 MINUTES, ANTACIDS/CALCIUM/MAGNESIUM FOR 4 HOURS AND AVOID BIOTIN*     Current Outpatient Medications (Respiratory):  .  fexofenadine (ALLEGRA) 60 MG tablet, Take 60 mg by mouth daily as needed for allergies or rhinitis. .  mometasone (NASONEX) 50 MCG/ACT nasal spray, instill 2 sprays into each nostril once daily   Current Outpatient Medications (Analgesics):  .   diclofenac (VOLTAREN) 75 MG EC tablet, Take 1 tablet (75 mg total) by mouth 2 (two) times daily with a meal.     Current Outpatient Medications (Other):  Marland Kitchen  B Complex Vitamins (B COMPLEX PO), Take by mouth. .  Cholecalciferol (VITAMIN D) 2000 UNITS tablet, Take 2,000 Units by mouth daily. Marland Kitchen  MAGNESIUM PO, Take by mouth daily. .  Wheat Dextrin (BENEFIBER) POWD, Take by mouth 2 (two) times daily.  Current Facility-Administered Medications (Other):  .  0.9 %  sodium chloride infusion  Current Problems (verified) Patient Active Problem List   Diagnosis Date Noted  . Systolic murmur [id number]  . Vitamin D deficiency 03/17/2014  . Medication management 03/17/2014  . Mixed hyperlipidemia 03/25/2013  . Anxiety   . Hypothyroid   . Neutropenia ([place])   . Allergy     Screening Tests Immunization History  Administered Date(s) Administered  . Influenza Split 12/08/2013  . Influenza, High Dose Seasonal PF 11/19/2014, 11/01/2016, 12/19/2017, 11/18/2018  . Influenza-Unspecified 12/29/2012, 12/19/2017  . PFIZER SARS-COV-2 Vaccination 04/04/2019, 04/23/2019  . Pneumococcal Conjugate-13 09/05/2014  . Pneumococcal Polysaccharide-23 11/02/2015  . Td 03/11/2004  . Tdap 07/08/2012  . Zoster 07/08/2012    Preventative care: Last colonoscopy: 2019 EGD: 2009 Last mammogram: 01/2019, 12/2017- had left BENIGN breast biopsy Last pap smear/pelvic exam: 07/2015 DEXA:04/2015 -0.6 normal CXR 2006 CT head 12/2016  Prior vaccinations: TD or Tdap: 2014  Influenza: 2020 Pneumococcal: 2017 Prevnar13: 2016 Shingles/Zostavax: 2014 need 2nd one  History reviewed: allergies, current medications, past family history, past medical history, past social history, past surgical history and problem list  Allergies No Known Allergies  SURGICAL HISTORY She  has a past surgical history that includes Laparoscopic gastric banding (2004); Knee arthroscopy (Left); Uterine fibroid surgery (2001); Foot surgery  (Right); Tubal ligation; Laparoscopic repair and removal of gastric band; Brachioplasty (03/2016); Cataract extraction (2019); and Breast biopsy (Left, 01/2018). FAMILY HISTORY Her family history includes Cancer in her mother; Diabetes in her father. SOCIAL HISTORY She  reports that she has never smoked. She has never used smokeless tobacco. She reports current alcohol use of about 1.0 - 2.0 standard drinks of alcohol per week. She reports that she does not use drugs.  MEDICARE WELLNESS OBJECTIVES: Physical activity: Current Exercise Habits: The patient does not participate in regular exercise at present(was dancing 3-4 days a week but has not since COVID) Cardiac risk factors: Cardiac Risk Factors include: advanced age (>17men, >68 women);dyslipidemia;hypertension;obesity (BMI >30kg/m2);sedentary lifestyle Depression/mood screen:   Depression screen [facility] 2/9 06/09/2019  Decreased Interest 0  Down, Depressed, Hopeless 0  PHQ - 2 Score 0    ADLs:  In your present state of health, do you have any difficulty performing the following activities: 06/09/2019 11/18/2018  Hearing? N N  Vision? N N  Difficulty concentrating or making decisions? N N  Walking or climbing stairs? N N  Dressing or bathing? N N  Doing errands, shopping? N N  Some recent data might be hidden     Cognitive Testing  Alert? Yes  Normal Appearance?Yes  Oriented to person? Yes  Place? Yes   Time? Yes  Recall of three objects?  Yes  Can perform simple calculations? Yes  Displays appropriate judgment?Yes  Can read the correct time from a watch face?Yes  EOL planning: Does Patient Have a Medical Advance Directive?: Yes Does patient want to make changes to medical advance directive?: No - Patient declined    Review of Systems:  Review of Systems  Constitutional: Negative.   HENT: Negative.   Eyes: Negative.   Respiratory: Negative.   Cardiovascular: Negative.   Gastrointestinal: Negative.   Genitourinary:  Negative.   Musculoskeletal: Negative.   Skin: Negative.   Neurological: Negative.   Endo/Heme/Allergies: Negative.   Psychiatric/Behavioral: The patient does not have insomnia.     Objective:   Today's Vitals   06/09/19 1454  BP: (!) 146/96  Pulse: 80  Resp: 16  Temp: (!) 96.9 F (36.1 C)  Weight: 229 lb 3.2 oz (104 kg)  Height: 5\' 5"  (1.651 m)    General appearance: alert, no distress, WD/WN,  female HEENT: normocephalic, sclerae anicteric, TMs pearly, nares patent, no discharge or erythema, pharynx normal Oral cavity: MMM, no lesions Neck: supple, no lymphadenopathy, no thyromegaly, no masses Heart: RRR, normal S1, S2, prominent S2 versus late systolic murmur, no radiation to carotids Lungs: CTA bilaterally, no wheezes, rhonchi, or rales Abdomen: +bs, soft, non tender, non distended, no masses, no hepatomegaly, no splenomegaly Musculoskeletal: nontender, no swelling, no obvious deformity 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  Extremities: no edema, no cyanosis, no clubbing Pulses: 2+ symmetric, upper and lower extremities, normal cap refill Neurological: alert, oriented x 3, CN2-12 intact, strength normal upper extremities and lower extremities, sensation normal throughout, DTRs 2+ throughout, no cerebellar signs, gait normal Psychiatric: normal affect, behavior normal, pleasant  Breast: defer Gyn: defer Rectal: defer  EKG: WNL no ST changes  Medicare Attestation I have personally reviewed: The patient's medical and social history Their use of alcohol, tobacco or illicit drugs Their current medications and supplements The patient's functional ability including ADLs,fall risks, home safety risks, cognitive, and hearing and visual impairment Diet and physical activities Evidence for depression or mood disorders  The patient's weight, height, BMI, and visual acuity have been recorded in the chart.  I have made referrals, counseling, and provided education to the patient based  on review of the above and I have provided the patient with a written personalized care plan for preventive services.     Vicie Mutters, PA-C   06/09/19

## 2019-06-09 ENCOUNTER — Ambulatory Visit: Payer: Medicare PPO | Admitting: Physician Assistant

## 2019-06-09 ENCOUNTER — Other Ambulatory Visit: Payer: Self-pay

## 2019-06-09 VITALS — BP 146/96 | HR 80 | Temp 96.9°F | Resp 16 | Ht 65.0 in | Wt 229.2 lb

## 2019-06-09 DIAGNOSIS — E538 Deficiency of other specified B group vitamins: Secondary | ICD-10-CM

## 2019-06-09 DIAGNOSIS — Z0000 Encounter for general adult medical examination without abnormal findings: Secondary | ICD-10-CM

## 2019-06-09 DIAGNOSIS — Z0001 Encounter for general adult medical examination with abnormal findings: Secondary | ICD-10-CM | POA: Diagnosis not present

## 2019-06-09 DIAGNOSIS — M25562 Pain in left knee: Secondary | ICD-10-CM

## 2019-06-09 DIAGNOSIS — E039 Hypothyroidism, unspecified: Secondary | ICD-10-CM | POA: Diagnosis not present

## 2019-06-09 DIAGNOSIS — T7840XD Allergy, unspecified, subsequent encounter: Secondary | ICD-10-CM

## 2019-06-09 DIAGNOSIS — E559 Vitamin D deficiency, unspecified: Secondary | ICD-10-CM

## 2019-06-09 DIAGNOSIS — E782 Mixed hyperlipidemia: Secondary | ICD-10-CM

## 2019-06-09 DIAGNOSIS — F419 Anxiety disorder, unspecified: Secondary | ICD-10-CM

## 2019-06-09 DIAGNOSIS — Z79899 Other long term (current) drug therapy: Secondary | ICD-10-CM | POA: Diagnosis not present

## 2019-06-09 DIAGNOSIS — R6889 Other general symptoms and signs: Secondary | ICD-10-CM

## 2019-06-09 DIAGNOSIS — D709 Neutropenia, unspecified: Secondary | ICD-10-CM

## 2019-06-09 DIAGNOSIS — E611 Iron deficiency: Secondary | ICD-10-CM

## 2019-06-09 DIAGNOSIS — R011 Cardiac murmur, unspecified: Secondary | ICD-10-CM

## 2019-06-09 MED ORDER — DICLOFENAC SODIUM 75 MG PO TBEC: 75.0000 mg | DELAYED_RELEASE_TABLET | Freq: Two times a day (BID) | ORAL | 0 refills | Status: DC

## 2019-06-09 NOTE — Patient Instructions (Addendum)
INFORMATION ABOUT YOUR XRAY  Can walk into [address] for an Insurance account manager. They will have the order and take you back. You do not any paper work, I should get the result back today or tomorrow. This order is good for a year.  Can call [phone] to schedule an appointment if you wish.   Use a dropper or use a cap to put peroxide, olive oil,mineral oil or canola oil in the effected ear- 2-3 times a week. Let it soak for 20-30 min then you can take a shower or use a baby bulb with warm water to wash out the ear wax.  Can buy debrox wax removal kit over the counter.  Do not use Qtips   1.  Limit use of pain relievers to no more than 2 days out of week to prevent risk of rebound or medication-overuse headache. 2.  Keep headache diary 3.  Exercise, hydration, caffeine cessation, sleep hygiene, monitor for and avoid triggers 4.  Consider:  magnesium citrate 495m daily, riboflavin 4049mdaily, and coenzyme Q10 10068mhree times daily   We may also treat TMJ if we think you have it If you are having frequent migraines we may put you on a once a day medication with fast acting medication to take. Also there is such a thing called rebound headache from over use of acute medications.  Please do not use rescue or acute medications more than 10 days a month or more than 3 days per week, this can cause a withdrawal and a rebound headache.  Here is more information below  Please remember, common headache triggers are: sleep deprivation, dehydration, overheating, stress, hypoglycemia or skipping meals and blood sugar fluctuations, excessive pain medications or excessive alcohol use or caffeine withdrawal. Some people have food triggers such as aged cheese, orange juice or chocolate, especially dark chocolate, or MSG (monosodium glutamate). Try to avoid these headache triggers as much possible.   It may be helpful to keep a headache diary to figure out what makes your headaches worse or brings  them on and what alleviates them. Some people report headache onset after exercise but studies have shown that regular exercise may actually prevent headaches from coming. If you have exercise-induced headaches, please make sure that you drink plenty of fluid before and after exercising and that you do not over do it and do not overheat.   Please go to the ER if there is weakness, thunderclap headache, visual changes, or any concerning factors    Migraine Headache A migraine headache is an intense, throbbing pain on one or both sides of your head. Recurrent migraines keep coming back. A migraine can last for 30 minutes to several hours. CAUSES  The exact cause of a migraine headache is not always known. However, a migraine may be caused when nerves in the brain become irritated and release chemicals that cause inflammation. This causes pain. Certain things may also trigger migraines, such as:   Alcohol.  Smoking.  Stress.  Menstruation.  Aged cheeses.  Foods or drinks that contain nitrates, glutamate, aspartame, or tyramine.  Lack of sleep.  Chocolate.  Caffeine.  Hunger.  Physical exertion.  Fatigue.  Medicines used to treat chest pain (nitroglycerine), birth control pills, estrogen, and some blood pressure medicines. SYMPTOMS   Pain on one or both sides of your head.  Pulsating or throbbing pain.  Severe pain that prevents daily activities.  Pain that is aggravated by any physical activity.  Nausea, vomiting,  or both.  Dizziness.  Pain with exposure to bright lights, loud noises, or activity.  General sensitivity to bright lights, loud noises, or smells. Before you get a migraine, you may get warning signs that a migraine is coming (aura). An aura may include:  Seeing flashing lights.  Seeing bright spots, halos, or zigzag lines.  Having tunnel vision or blurred vision.  Having feelings of numbness or tingling.  Having trouble talking.  Having  muscle weakness. DIAGNOSIS  A recurrent migraine headache is often diagnosed based on:  Symptoms.  Physical examination.  A CT scan or MRI of your head. These imaging tests cannot diagnose migraines but can help rule out other causes of headaches.   TREATMENT  Medicines may be given for pain and nausea. Medicines can also be given to help prevent recurrent migraines. HOME CARE INSTRUCTIONS  Only take over-the-counter or prescription medicines for pain or discomfort as directed by your health care provider. The use of long-term narcotics is not recommended.  Lie down in a dark, quiet room when you have a migraine.  Keep a journal to find out what may trigger your migraine headaches. For example, write down:  What you eat and drink.  How much sleep you get.  Any change to your diet or medicines.  Limit alcohol consumption.  Quit smoking if you smoke.  Get 7-9 hours of sleep, or as recommended by your health care provider.  Limit stress.  Keep lights dim if bright lights bother you and make your migraines worse. SEEK MEDICAL CARE IF:   You do not get relief from the medicines given to you.  You have a recurrence of pain.  You have a fever. SEEK IMMEDIATE MEDICAL CARE IF:  Your migraine becomes severe.  You have a stiff neck.  You have loss of vision.  You have muscular weakness or loss of muscle control.  You start losing your balance or have trouble walking.  You feel faint or pass out. . You have severe symptoms that are different from your first symptoms. MAKE SURE YOU:   Understand these instructions.  Will watch your condition.  Will get help right away if you are not doing well or get worse.   This information is not intended to replace advice given to you by your health care provider. Make sure you discuss any questions you have with your health care provider.   Document Released: 11/20/2000 Document Revised: 03/18/2014 Document Reviewed:  11/02/2012 Elsevier Interactive Patient Education 2016 [employer].  Common Migraine Triggers   Foods Aged cheese, alcohol, nuts, chocolate, yogurt, onions, figs, liver, caffeinated foods and beverages, monosodium glutamate (MSG), smoked or pickled fish/meat, nitrate/nitrate preserved foods (hotdogs, pepperoni, salami) tyramine  Medications Antibiotics (tetracycline, griseofulvin), antihypertensives (nifedipine, captopril), hormones (oral contraceptives, estrogens), histamine-2 blockers (cimetidine, raniidine, vasodilators (nitroglycerine, isosorbide dinitrate)  Sensory Stimuli Flickering/bright/fluorescent lights, bright sunlight, odors (perfume, chemicals, cigarette smoke)  Lifestyle Changes Time zones, sleep patterns, eating habits, caffeine withdrawal stress  Other Menstrual cycle, weather/season/air pressure changes, high altitude  Adapted from [place] and [place], [place]. Clin. [place]; [place] and [place]. [facility] Headache, 1998  Hormonal variations also are believed to play a part.  Fluctuations of the female hormone estrogen (such as just before menstruation) affect a chemical called serotonin-when serotonin levels in the brain fall, the dilation (expansion) of blood vessels in the brain that is characteristic of migraine often follows.  Many factors or "triggers" can start a migraine.  In people who get migraines, most experts  think certain activities or foods may trigger temporary changes in the blood vessels around the brain.  Swelling of these blood vessels may cause pain in the nearby nerves.  Allergy Headaches:  Hotdogs Milk  Onions  Thyme Bacon  Chocolate Garlic  Nutmeg Ham  Dark Cola Pork  Cinnamon Salami  Nuts  Egg  Ginger Sausage Red wine Cloves  Cheddar Cheese Caffeine  I think it is possible that you have sleep apnea. It can cause interrupted sleep, headaches, frequent awakenings, fatigue, dry mouth, fast/slow heart beats, memory issues, anxiety/depression,  swelling, numbness tingling hands/feet, weight gain, shortness of breath, and the list goes on. Sleep apnea needs to be ruled out because if it is left untreated it does eventually lead to abnormal heart beats, lung failure or heart failure as well as increasing the risk of heart attack and stroke. There are masks you can wear OR a mouth piece that I can give you information about. Often times though people feel MUCH better after getting treatment.   Sleep Apnea  Sleep apnea is a sleep disorder characterized by abnormal pauses in breathing while you sleep. When your breathing pauses, the level of oxygen in your blood decreases. This causes you to move out of deep sleep and into light sleep. As a result, your quality of sleep is poor, and the system that carries your blood throughout your body (cardiovascular system) experiences stress. If sleep apnea remains untreated, the following conditions can develop:  High blood pressure (hypertension).  Coronary artery disease.  Inability to achieve or maintain an erection (impotence).  Impairment of your thought process (cognitive dysfunction). There are three types of sleep apnea: 1. Obstructive sleep apnea--Pauses in breathing during sleep because of a blocked airway. 2. Central sleep apnea--Pauses in breathing during sleep because the area of the brain that controls your breathing does not send the correct signals to the muscles that control breathing. 3. Mixed sleep apnea--A combination of both obstructive and central sleep apnea.  RISK FACTORS The following risk factors can increase your risk of developing sleep apnea:  Being overweight.  Smoking.  Having narrow passages in your nose and throat.  Being of older age.  Being female.  Alcohol use.  Sedative and tranquilizer use.  Ethnicity. Among individuals younger than 35 years, African Americans are at increased risk of sleep apnea. SYMPTOMS   Difficulty staying asleep.  Daytime  sleepiness and fatigue.  Loss of energy.  Irritability.  Loud, heavy snoring.  Morning headaches.  Trouble concentrating.  Forgetfulness.  Decreased interest in sex. DIAGNOSIS  In order to diagnose sleep apnea, your caregiver will perform a physical examination. Your caregiver may suggest that you take a home sleep test. Your caregiver may also recommend that you spend the night in a sleep lab. In the sleep lab, several monitors record information about your heart, lungs, and brain while you sleep. Your leg and arm movements and blood oxygen level are also recorded. TREATMENT The following actions may help to resolve mild sleep apnea:  Sleeping on your side.   Using a decongestant if you have nasal congestion.   Avoiding the use of depressants, including alcohol, sedatives, and narcotics.   Losing weight and modifying your diet if you are overweight. There also are devices and treatments to help open your airway:  Oral appliances. These are custom-made mouthpieces that shift your lower jaw forward and slightly open your bite. This opens your airway.  Devices that create positive airway pressure. This positive pressure "splints"  your airway open to help you breathe better during sleep. The following devices create positive airway pressure:  Continuous positive airway pressure (CPAP) device. The CPAP device creates a continuous level of air pressure with an air pump. The air is delivered to your airway through a mask while you sleep. This continuous pressure keeps your airway open.  Nasal expiratory positive airway pressure (EPAP) device. The EPAP device creates positive air pressure as you exhale. The device consists of single-use valves, which are inserted into each nostril and held in place by adhesive. The valves create very little resistance when you inhale but create much more resistance when you exhale. That increased resistance creates the positive airway pressure. This  positive pressure while you exhale keeps your airway open, making it easier to breath when you inhale again.  Bilevel positive airway pressure (BPAP) device. The BPAP device is used mainly in patients with central sleep apnea. This device is similar to the CPAP device because it also uses an air pump to deliver continuous air pressure through a mask. However, with the BPAP machine, the pressure is set at two different levels. The pressure when you exhale is lower than the pressure when you inhale.  Surgery. Typically, surgery is only done if you cannot comply with less invasive treatments or if the less invasive treatments do not improve your condition. Surgery involves removing excess tissue in your airway to create a wider passage way. Document Released: 02/15/2002 Document Revised: 06/22/2012 Document Reviewed: 07/04/2011 [facility] Patient Information 2015 [place], [place]. This information is not intended to replace advice given to you by your health care provider. Make sure you discuss any questions you have with your health care provider.

## 2019-06-10 ENCOUNTER — Encounter: Payer: Self-pay | Admitting: Physician Assistant

## 2019-06-10 LAB — COMPLETE METABOLIC PANEL WITH GFR
AG Ratio: 1.9 (calc) (ref 1.0–2.5)
ALT: 15 U/L (ref 6–29)
AST: 20 U/L (ref 10–35)
Albumin: 4.5 g/dL (ref 3.6–5.1)
Alkaline phosphatase (APISO): 61 U/L (ref 37–153)
BUN: 22 mg/dL (ref 7–25)
CO2: 29 mmol/L (ref 20–32)
Calcium: 9.5 mg/dL (ref 8.6–10.4)
Chloride: 103 mmol/L (ref 98–110)
Creat: 0.86 mg/dL (ref 0.60–0.93)
GFR, Est African American: 79 mL/min/{1.73_m2} (ref >=60)
GFR, Est Non African American: 68 mL/min/{1.73_m2} (ref >=60)
Globulin: 2.4 g/dL (calc) (ref 1.9–3.7)
Glucose, Bld: 92 mg/dL (ref 65–99)
Potassium: 4.4 mmol/L (ref 3.5–5.3)
Sodium: 140 mmol/L (ref 135–146)
Total Bilirubin: 0.5 mg/dL (ref 0.2–1.2)
Total Protein: 6.9 g/dL (ref 6.1–8.1)

## 2019-06-10 LAB — LIPID PANEL
Cholesterol: 225 mg/dL — ABNORMAL HIGH (ref <200)
HDL: 81 mg/dL (ref >=50)
LDL Cholesterol (Calc): 128 mg/dL (calc) — ABNORMAL HIGH
Non-HDL Cholesterol (Calc): 144 mg/dL (calc) — ABNORMAL HIGH (ref <130)
Total CHOL/HDL Ratio: 2.8 (calc) (ref <5.0)
Triglycerides: 64 mg/dL (ref <150)

## 2019-06-10 LAB — IRON, TOTAL/TOTAL IRON BINDING CAP
%SAT: 22 % (calc) (ref 16–45)
Iron: 91 ug/dL (ref 45–160)
TIBC: 416 mcg/dL (calc) (ref 250–450)

## 2019-06-10 LAB — CBC WITH DIFFERENTIAL/PLATELET
Absolute Monocytes: 342 cells/uL (ref 200–950)
Basophils Absolute: 49 cells/uL (ref 0–200)
Basophils Relative: 0.8 %
Eosinophils Absolute: 49 cells/uL (ref 15–500)
Eosinophils Relative: 0.8 %
HCT: 43.6 % (ref 35.0–45.0)
Hemoglobin: 14.7 g/dL (ref 11.7–15.5)
Lymphs Abs: 1037 cells/uL (ref 850–3900)
MCH: 32.7 pg (ref 27.0–33.0)
MCHC: 33.7 g/dL (ref 32.0–36.0)
MCV: 96.9 fL (ref 80.0–100.0)
MPV: 9.4 fL (ref 7.5–12.5)
Monocytes Relative: 5.6 %
Neutro Abs: 4624 cells/uL (ref 1500–7800)
Neutrophils Relative %: 75.8 %
Platelets: 348 10*3/uL (ref 140–400)
RBC: 4.5 10*6/uL (ref 3.80–5.10)
RDW: 12.6 % (ref 11.0–15.0)
Total Lymphocyte: 17 %
WBC: 6.1 10*3/uL (ref 3.8–10.8)

## 2019-06-10 LAB — VITAMIN D 25 HYDROXY (VIT D DEFICIENCY, FRACTURES): Vit D, 25-Hydroxy: 41 ng/mL (ref 30–100)

## 2019-06-10 LAB — FERRITIN: Ferritin: 70 ng/mL (ref 16–288)

## 2019-06-10 LAB — TSH: TSH: 3.22 mIU/L (ref 0.40–4.50)

## 2019-06-10 LAB — MAGNESIUM: Magnesium: 2.4 mg/dL (ref 1.5–2.5)

## 2019-06-10 LAB — VITAMIN B12: Vitamin B-12: 488 pg/mL (ref 200–1100)

## 2019-07-27 ENCOUNTER — Other Ambulatory Visit: Payer: Self-pay | Admitting: Physician Assistant

## 2019-07-27 DIAGNOSIS — M25562 Pain in left knee: Secondary | ICD-10-CM

## 2019-08-18 ENCOUNTER — Other Ambulatory Visit: Payer: Self-pay | Admitting: Physician Assistant

## 2019-08-23 ENCOUNTER — Ambulatory Visit
Admission: RE | Admit: 2019-08-23 | Discharge: 2019-08-23 | Disposition: A | Discharge status: Home / Self Care | Payer: Medicare PPO | Source: Ambulatory Visit | Attending: Physician Assistant | Admitting: Physician Assistant

## 2019-08-23 ENCOUNTER — Other Ambulatory Visit: Payer: Self-pay

## 2019-08-23 DIAGNOSIS — R011 Cardiac murmur, unspecified: Secondary | ICD-10-CM

## 2019-08-23 DIAGNOSIS — I10 Essential (primary) hypertension: Secondary | ICD-10-CM | POA: Diagnosis not present

## 2019-08-23 DIAGNOSIS — J984 Other disorders of lung: Secondary | ICD-10-CM | POA: Diagnosis not present

## 2019-08-23 DIAGNOSIS — E782 Mixed hyperlipidemia: Secondary | ICD-10-CM

## 2019-08-23 NOTE — Progress Notes (Signed)
Subjective:    Patient ID: Kimberly Bowers, female    DOB: [birth year], 69 y.o.   MRN: [id number]  HPI 69 y.o. WF presents with ear pain and thyroid issues.   She states she has gained weight, has felt low energy, has not felt her self. Then over the weekend she started to feel bloated, constipated, and Saturday night she had racing heart rate. States she was up all night long Saturday due to palpitations, feeling like she was going to jump out of her skin. She was skipping beats. States she had similar issues in the past with her thyroid.   It felt better Sunday and Monday. She had a good BM yesterday. She had normal colonoscopy 11/2017   She has been checking her pulse and it will "skip a beat" occ. She has history of systolic murmur, sister with MVP. She had recent normal CXR, has not had echo.   She is on thyroid medication, 124mcg daily.  Her medication was not changed last visit.  She is not on any biotin.  Lab Results  Component Value Date   TSH 3.22 06/09/2019   BMI is Body mass index is 37.94 kg/m., she is working on diet and exercise. She has been on phentermine years ago without help. She was in a weight loss program in [place], added on metformin at one time.  Wt Readings from Last 3 Encounters:  08/24/19 228 lb (103.4 kg)  06/09/19 229 lb 3.2 oz (104 kg)  11/18/18 214 lb 3.2 oz (97.2 kg)    Blood pressure 120/90, pulse 99, temperature (!) 97.3 F (36.3 C), weight 228 lb (103.4 kg), last menstrual period 03/11/2000.  Medications  Current Outpatient Medications (Endocrine & Metabolic):  .  levothyroxine (SYNTHROID) 100 MCG tablet, take 1 tablet by mouth daily with only water on empty stomach *no food for 30 mins, antacids/ calcium/ magnesium for 4 hrs and avoid biotin*     Current Outpatient Medications (Respiratory):  .  fexofenadine (ALLEGRA) 60 MG tablet, Take 60 mg by mouth daily as needed for allergies or rhinitis. .  mometasone (NASONEX) 50 MCG/ACT nasal spray,  instill 2 sprays into each nostril once daily   Current Outpatient Medications (Analgesics):  .  diclofenac (VOLTAREN) 75 MG EC tablet, TAKE ONE TABLET BY MOUTH TWICE DAILY WITH MEALS      Current Outpatient Medications (Other):  Marland Kitchen  B Complex Vitamins (B COMPLEX PO), Take by mouth. .  Cholecalciferol (VITAMIN D) 2000 UNITS tablet, Take 2,000 Units by mouth daily. Marland Kitchen  MAGNESIUM PO, Take by mouth daily. .  Wheat Dextrin (BENEFIBER) POWD, Take by mouth 2 (two) times daily.  Current Facility-Administered Medications (Other):  .  0.9 %  sodium chloride infusion  Problem list She has Anxiety; Hypothyroid; Neutropenia ([place]); Allergy; Mixed hyperlipidemia; Vitamin D deficiency; Medication management; and Systolic murmur on their problem list.  She  has a past surgical history that includes Laparoscopic gastric banding (2004); Knee arthroscopy (Left); Uterine fibroid surgery (2001); Foot surgery (Right); Tubal ligation; Laparoscopic repair and removal of gastric band; Brachioplasty (03/2016); Cataract extraction (2019); and Breast biopsy (Left, 01/2018).  Her family history includes Cancer in her mother; Diabetes in her father.  She  reports that she has never smoked. She has never used smokeless tobacco. She reports current alcohol use of about 1.0 - 2.0 standard drink of alcohol per week. She reports that she does not use drugs.  Review of Systems SEE HPI    Objective:   Physical  Exam General appearance: alert, no distress, WD/WN,  female HEENT: normocephalic, sclerae anicteric, TMs pearly, nares patent, no discharge or erythema, pharynx normal Oral cavity: MMM, no lesions Neck: supple, no lymphadenopathy, no thyromegaly, no masses Heart: RRR, normal S1, S2, prominent S2 versus late systolic murmur, no radiation to carotids Lungs: CTA bilaterally, no wheezes, rhonchi, or rales Abdomen: +bs, soft, non tender, non distended, no masses, no hepatomegaly, no splenomegaly Musculoskeletal:  nontender, no swelling, no obvious deformity 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  Extremities: no edema, no cyanosis, no clubbing Pulses: 2+ symmetric, upper and lower extremities, normal cap refill Neurological: alert, oriented x 3, CN2-12 intact, strength normal upper extremities and lower extremities, sensation normal throughout, DTRs 2+ throughout, no cerebellar signs, gait normal Psychiatric: normal affect, behavior normal, pleasant        Assessment & Plan:    Systolic murmur -     ECHOCARDIOGRAM COMPLETE; Future - sister with MVP- patient has never had the murmur evaluated - will get echocardiogram, no SOB, no CP  Hypothyroidism, unspecified type -     CBC with Differential/Platelet -     COMPLETE METABOLIC PANEL WITH GFR -     TSH -     T4, free -     TSH - has been closer to 4, may want goal to be 2 - check labs, not on biotin.   Palpitations -     LONG TERM MONITOR (3-14 DAYS); Future -check for MVP - will check zio holter  Medication management -     Magnesium  The patient was advised to call immediately if she has any concerning symptoms in the interval. The patient voices understanding of current treatment options and is in agreement with the current care plan.The patient knows to call the clinic with any problems, questions or concerns or go to the ER if any further progression of symptoms.     Future Appointments  Date Time Provider [place]  11/25/2019  9:00 AM Vicie Mutters, PA-C [facility] None

## 2019-08-24 ENCOUNTER — Ambulatory Visit: Payer: Medicare PPO | Admitting: Physician Assistant

## 2019-08-24 ENCOUNTER — Encounter: Payer: Self-pay | Admitting: Physician Assistant

## 2019-08-24 VITALS — BP 120/90 | HR 99 | Temp 97.3°F | Wt 228.0 lb

## 2019-08-24 DIAGNOSIS — R002 Palpitations: Secondary | ICD-10-CM | POA: Diagnosis not present

## 2019-08-24 DIAGNOSIS — R011 Cardiac murmur, unspecified: Secondary | ICD-10-CM | POA: Diagnosis not present

## 2019-08-24 DIAGNOSIS — Z79899 Other long term (current) drug therapy: Secondary | ICD-10-CM | POA: Diagnosis not present

## 2019-08-24 DIAGNOSIS — E039 Hypothyroidism, unspecified: Secondary | ICD-10-CM

## 2019-08-24 NOTE — Patient Instructions (Addendum)
Will be getting an echo and holter monitor on you  Go to the ER if any chest pain, shortness of breath, nausea, dizziness, severe HA, changes vision/speech  Check out [employer] book   Palpitations Palpitations are feelings that your heartbeat is irregular or is faster than normal. It may feel like your heart is fluttering or skipping a beat. Palpitations are usually not a serious problem. They may be caused by many things, including smoking, caffeine, alcohol, stress, and certain medicines or drugs. Most causes of palpitations are not serious. However, some palpitations can be a sign of a serious problem. You may need further tests to rule out serious medical problems. Follow these instructions at home:     Pay attention to any changes in your condition. Take these actions to help manage your symptoms: Eating and drinking  Avoid foods and drinks that may cause palpitations. These may include: ? Caffeinated coffee, tea, soft drinks, diet pills, and energy drinks. ? Chocolate. ? Alcohol. Lifestyle  Take steps to reduce your stress and anxiety. Things that can help you relax include: ? Yoga. ? Mind-body activities, such as deep breathing, meditation, or using words and images to create positive thoughts (guided imagery). ? Physical activity, such as swimming, jogging, or walking. Tell your health care provider if your palpitations increase with activity. If you have chest pain or shortness of breath with activity, do not continue the activity until you are seen by your health care provider. ? Biofeedback. This is a method that helps you learn to use your mind to control things in your body, such as your heartbeat.  Do not use drugs, including cocaine or ecstasy. Do not use marijuana.  Get plenty of rest and sleep. Keep a regular bed time. General instructions  Take over-the-counter and prescription medicines only as told by your health care provider.  Do not use any products that  contain nicotine or tobacco, such as cigarettes and e-cigarettes. If you need help quitting, ask your health care provider.  Keep all follow-up visits as told by your health care provider. This is important. These may include visits for further testing if palpitations do not go away or get worse. Contact a health care provider if you:  Continue to have a fast or irregular heartbeat after 24 hours.  Notice that your palpitations occur more often. Get help right away if you:  Have chest pain or shortness of breath.  Have a severe headache.  Feel dizzy or you faint. Summary  Palpitations are feelings that your heartbeat is irregular or is faster than normal. It may feel like your heart is fluttering or skipping a beat.  Palpitations may be caused by many things, including smoking, caffeine, alcohol, stress, certain medicines, and drugs.  Although most causes of palpitations are not serious, some causes can be a sign of a serious medical problem.  Get help right away if you faint or have chest pain, shortness of breath, a severe headache, or dizziness. This information is not intended to replace advice given to you by your health care provider. Make sure you discuss any questions you have with your health care provider. Document Revised: 04/09/2017 Document Reviewed: 04/09/2017 Elsevier Patient Education  2020 [place] Qualifies for Obesity Medications? Although everyone is hopeful for a fast and easy way to lose weight, nothing has been shown to replace a prudent, calorie-controlled diet along with behavior modification as a cornerstone for all obesity treatments.   The next tool  that can be used to achieve weight-loss and health improvement is medication.   Pharmacotherapy may be offered to Individuals affected by obesity who have failed to achieve weight-loss through diet and exercise alone.  Currently there are several drugs that are approved by the FDA for  weight-loss: . phentermine products (Adipex-P or Suprenza)  . phentermine- topiramate ER (Qsymia)  . Bupropion; Naltrexone ER (Contrave)  . Saxenda (liraglutide), once a day weight loss shot .  Marland Kitchen Let's take a closer look at each of these medications and learn how they work:   Phentermine-Topiramate ER (Qsymia) How does it work? This combination medication was approved by the FDA in July 2012. Topiramate is a medication used to treat seizures. It was found that a common side effect of this medication was weight-loss. Phentermine, as described in this brochure, helps to increase your energy and decrease your appetite.  Concerns: The most common side effects were dry mouth, constipation and pins-and-needle feeling in extremities.  Qsymia should NOT be taken during pregnancy since Topiramate ER, a component of Qsymia, has been associated with an increased risk of birth defects.  MEDIATIONS SEPARATED: PHENTERMINE How does it work? Phentermine is a medication available by prescription that works on chemicals in the brain to decrease your appetite. It also has a mild stimulant component that adds extra energy. Phentermine is a pill that is taken once a day in the morning time.   Tolerance to this medication normally develops, so it can only be used for several months at a time.  Common side effects are dry mouth, sleeplessness, constipation.  Concerns: Due to its stimulant effect, a person's blood pressure and heart rate may increase when on this medication; therefore, you must be monitored closely by a physician who is experienced in prescribing this medication. It cannot be used in patients with some heart conditions (such as poorly controlled blood pressure), glaucoma (increased pressure in your eye), stroke or overactive thyroid. There is some concern for abuse, but this is minimal if the medication is appropriately used as directed by a healthcare professional.  TOPIRAMATE Sometimes we  will prescribe topiramate AND phentermine together to make Qsymia- separating the medications makes it cheaper.  How to start it start on 1/2 pill for 3-5 nights, can increase to a whole pill for 1-2 weeks.  This medication is good for weight loss, headaches, pain This medication can cause numbness, tingling and can cause brain fog- stop if you get these Check with your eye doctor if you have a history of glaucoma and stop if you severe vision changes or blurry vision.   g Bupropion; Naltrexone ER (Contrave) How does it work? Works in two areas of your brain, hunger center and reward center to reduce hunger and cravings.   Concerns Most common side effects are dry mouth, constipation or diarrhea, headache.  Please take it with a full glass of water and low fat meal.   MEDICATIONS SEPARATED: WELLBUTRIN Only antidepressant that helps with weight loss Used frequently for seasonal effective disorder If you get nausea and HA after the first week please stop- can cause people to clench their jaw- stop it.  If you get anxious or snappy with people than stop the medication But this medication can help with energy, weight loss and mood It kicks in about 1-2 weeks And can be stopped quickly  NALTREXONE You can NOT take this medication if you are on an opioid.  It is used to treat patients that have cravings/addiction to  alcohol/opioids.  Taken with supper/dinner.   SAXENDA Is an expensive once a day injectable that helps decrease appetite.  You usually have to fail other therapies and show 6 months of trying before we can get it approved.  Can cause nausea.   VYVANSE Is a stimulant that is approved for binge eating disorder.    Follow-up Visits: Patients are given the opportunity to revisit a topic or obtain more information on an area of interest during follow-up visits.  The frequency of and interval between follow-up visits is determined on a patient-by-patient basis.   Frequent  visits (every 3 to 4 weeks) are encouraged until initial weight-loss goals (5 to 10 percent of body weight) are achieved.   At that point, less frequent visits are typically scheduled as needed for individual patients. However, since obesity is considered a chronic life-long problem for many individuals, periodic continual follow up is recommended.  Research has shown that weight-loss as low as 5 percent of initial body weight can lead to favorable improvements in blood pressure, cholesterol, glucose levels and insulin sensitivity. The risk of developing heart disease is reduced the most in patients who have impaired glucose tolerance, type 2 diabetes or high blood pressure.

## 2019-08-25 LAB — CBC WITH DIFFERENTIAL/PLATELET
Absolute Monocytes: 417 cells/uL (ref 200–950)
Basophils Absolute: 39 cells/uL (ref 0–200)
Basophils Relative: 0.8 %
Eosinophils Absolute: 78 cells/uL (ref 15–500)
Eosinophils Relative: 1.6 %
HCT: 44.2 % (ref 35.0–45.0)
Hemoglobin: 15.3 g/dL (ref 11.7–15.5)
Lymphs Abs: 931 cells/uL (ref 850–3900)
MCH: 33.3 pg — ABNORMAL HIGH (ref 27.0–33.0)
MCHC: 34.6 g/dL (ref 32.0–36.0)
MCV: 96.1 fL (ref 80.0–100.0)
MPV: 9.6 fL (ref 7.5–12.5)
Monocytes Relative: 8.5 %
Neutro Abs: 3435 cells/uL (ref 1500–7800)
Neutrophils Relative %: 70.1 %
Platelets: 296 10*3/uL (ref 140–400)
RBC: 4.6 10*6/uL (ref 3.80–5.10)
RDW: 12.6 % (ref 11.0–15.0)
Total Lymphocyte: 19 %
WBC: 4.9 10*3/uL (ref 3.8–10.8)

## 2019-08-25 LAB — COMPLETE METABOLIC PANEL WITH GFR
AG Ratio: 2.1 (calc) (ref 1.0–2.5)
ALT: 15 U/L (ref 6–29)
AST: 19 U/L (ref 10–35)
Albumin: 4.5 g/dL (ref 3.6–5.1)
Alkaline phosphatase (APISO): 62 U/L (ref 37–153)
BUN: 19 mg/dL (ref 7–25)
CO2: 24 mmol/L (ref 20–32)
Calcium: 9.5 mg/dL (ref 8.6–10.4)
Chloride: 103 mmol/L (ref 98–110)
Creat: 0.91 mg/dL (ref 0.60–0.93)
GFR, Est African American: 74 mL/min/{1.73_m2} (ref >=60)
GFR, Est Non African American: 63 mL/min/{1.73_m2} (ref >=60)
Globulin: 2.1 g/dL (calc) (ref 1.9–3.7)
Glucose, Bld: 86 mg/dL (ref 65–99)
Potassium: 4.5 mmol/L (ref 3.5–5.3)
Sodium: 138 mmol/L (ref 135–146)
Total Bilirubin: 0.5 mg/dL (ref 0.2–1.2)
Total Protein: 6.6 g/dL (ref 6.1–8.1)

## 2019-08-25 LAB — T4, FREE: Free T4: 1.7 ng/dL (ref 0.8–1.8)

## 2019-08-25 LAB — MAGNESIUM: Magnesium: 2.4 mg/dL (ref 1.5–2.5)

## 2019-08-25 LAB — TSH: TSH: 3.51 mIU/L (ref 0.40–4.50)

## 2019-09-01 ENCOUNTER — Telehealth: Payer: Self-pay

## 2019-09-01 NOTE — Telephone Encounter (Signed)
-----   Message from Vicie Mutters, [place] sent at 09/01/2019  8:05 AM EDT ----- Regarding: FW: PA# fore echocardiogram I don't know if Katrina did this PA for this echo before she left. Can one of you ladies check on that for me please? Estill Bamberg ----- Message ----- From: Frederic Jericho Sent: 08/31/2019  11:43 AM EDT To: Vicie Mutters, PA-C Subject: FW: PA# fore echocardiogram                    Checking on PA# to schedule  ----- Message ----- From: Frederic Jericho Sent: 08/24/2019   3:32 PM EDT To: Vicie Mutters, PA-C Subject: PA# fore echocardiogram                        We received an order for an Echocardiogram from your Provider.  Will you please check to see if Prior Authorization is needed for the test in order for [place] to schedule.  Once PA# is obtained please document on order in the comment line and we will be glad to call and schedule.  Thank you so much for your help with this. Please forward to your referral coordinator to obtain.

## 2019-09-01 NOTE — Telephone Encounter (Signed)
Working on PA

## 2019-09-02 ENCOUNTER — Ambulatory Visit (INDEPENDENT_AMBULATORY_CARE_PROVIDER_SITE_OTHER): Payer: Medicare PPO

## 2019-09-02 DIAGNOSIS — R002 Palpitations: Secondary | ICD-10-CM | POA: Diagnosis not present

## 2019-09-22 ENCOUNTER — Ambulatory Visit (INDEPENDENT_AMBULATORY_CARE_PROVIDER_SITE_OTHER): Payer: Medicare PPO

## 2019-09-22 ENCOUNTER — Other Ambulatory Visit: Payer: Self-pay

## 2019-09-22 VITALS — BP 120/88 | HR 88 | Temp 96.4°F | Wt 228.0 lb

## 2019-09-22 DIAGNOSIS — E039 Hypothyroidism, unspecified: Secondary | ICD-10-CM

## 2019-09-22 LAB — TSH: TSH: 0.76 mIU/L (ref 0.40–4.50)

## 2019-09-22 NOTE — Progress Notes (Signed)
Patient presents to the office for a nurse visit to have labs done. Patient states that she is taking Synthroid, 177mcg, 1 tablet daily and adding a 1/2 tablet on Mondays, Wednesdays and Fridays. Vitals taken and recorded.

## 2019-09-25 DIAGNOSIS — R002 Palpitations: Secondary | ICD-10-CM | POA: Diagnosis not present

## 2019-09-29 ENCOUNTER — Ambulatory Visit (INDEPENDENT_AMBULATORY_CARE_PROVIDER_SITE_OTHER): Payer: Medicare PPO

## 2019-09-29 ENCOUNTER — Other Ambulatory Visit: Payer: Self-pay

## 2019-09-29 DIAGNOSIS — H6121 Impacted cerumen, right ear: Secondary | ICD-10-CM | POA: Diagnosis not present

## 2019-09-29 NOTE — Progress Notes (Signed)
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   Wax is removed by syringing and manual debridement. Instructions for home care to prevent wax buildup are given.

## 2019-10-05 ENCOUNTER — Ambulatory Visit (HOSPITAL_COMMUNITY): Payer: Medicare PPO | Attending: Cardiology

## 2019-10-05 ENCOUNTER — Other Ambulatory Visit: Payer: Self-pay

## 2019-10-05 DIAGNOSIS — R011 Cardiac murmur, unspecified: Secondary | ICD-10-CM | POA: Diagnosis not present

## 2019-10-05 LAB — ECHOCARDIOGRAM COMPLETE
Area-P 1/2: 2.52 cm2
S' Lateral: 4.3 cm

## 2019-10-12 NOTE — Progress Notes (Signed)
Subjective:    Patient ID: Kimberly Bowers, female    DOB: 03/29/1950, 69 y.o.   MRN: [id number]  HPI 69 y.o. WF with history of hypothyroidism, anxiety, chol, presents with weight los medication questions.    She is on 1 pill daily but 1.5 pill 3 days a week- TSH is improved and she is feeling better, has lost weight and is sleeping better. She would like to get a once a day pill when she needs a refill.  Lab Results  Component Value Date   TSH 0.76 09/22/2019   BMI is Body mass index is 36.61 kg/m., she is working on diet and exercise. Wt Readings from Last 3 Encounters:  10/13/19 220 lb (99.8 kg)  09/22/19 228 lb (103.4 kg)  08/24/19 228 lb (103.4 kg)     Blood pressure 120/86, pulse 100, temperature (!) 97.3 F (36.3 C), weight 220 lb (99.8 kg), last menstrual period 03/11/2000, SpO2 98 %.  Medications  Current Outpatient Medications (Endocrine & Metabolic):  .  levothyroxine (SYNTHROID) 100 MCG tablet, take 1 tablet by mouth daily with only water on empty stomach *no food for 30 mins, antacids/ calcium/ magnesium for 4 hrs and avoid biotin*     Current Outpatient Medications (Respiratory):  .  fexofenadine (ALLEGRA) 60 MG tablet, Take 60 mg by mouth daily as needed for allergies or rhinitis. .  mometasone (NASONEX) 50 MCG/ACT nasal spray, instill 2 sprays into each nostril once daily   Current Outpatient Medications (Analgesics):  .  diclofenac (VOLTAREN) 75 MG EC tablet, TAKE ONE TABLET BY MOUTH TWICE DAILY WITH MEALS      Current Outpatient Medications (Other):  Marland Kitchen  B Complex Vitamins (B COMPLEX PO), Take by mouth. .  Cholecalciferol (VITAMIN D) 2000 UNITS tablet, Take 2,000 Units by mouth daily. Marland Kitchen  MAGNESIUM PO, Take by mouth daily. .  Wheat Dextrin (BENEFIBER) POWD, Take by mouth 2 (two) times daily. Marland Kitchen  buPROPion (WELLBUTRIN XL) 150 MG 24 hr tablet, Take 1 tablet (150 mg total) by mouth every morning.  Current Facility-Administered Medications (Other):  .  0.9  %  sodium chloride infusion  Problem list She has Anxiety; Hypothyroid; Neutropenia ([place]); Allergy; Mixed hyperlipidemia; Vitamin D deficiency; Medication management; and Aortic dilatation ([facility]) on their problem list.   Review of Systems  Constitutional: Negative.   HENT: Negative.   Respiratory: Negative.   Cardiovascular: Negative.   Gastrointestinal: Negative.   Genitourinary: Negative.   Musculoskeletal: Negative.   Skin: Negative.   Neurological: Negative.   Hematological: Negative.   Psychiatric/Behavioral: Negative.        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  Extremities: no edema, no cyanosis, no clubbing Pulses: 2+ symmetric, upper and lower extremities, normal cap refill Neurological: alert, oriented x 3, CN2-12 intact, strength normal upper extremities and lower extremities, sensation normal throughout, DTRs 2+ throughout, no cerebellar signs, gait normal Psychiatric: normal affect, behavior normal, pleasant      Assessment & Plan:  Kimberly Bowers was seen today for other.  Diagnoses and all orders for this visit:  BMI 36.0-36.9,adult -     buPROPion (WELLBUTRIN XL) 150 MG 24 hr tablet; Take 1 tablet (150 mg total) by mouth every morning. May add on naltrexone at follow up  If this does not help may try phentermine  Hypothyroidism, unspecified type Hypothyroidism-check TSH level, continue medications the  same, reminded to take on an empty stomach 30-58mins before food.   Aortic dilatation ([facility]) Keep BP controlled, will monitor Not a smoker, no family history     Future Appointments  Date Time Provider [place]  11/25/2019  9:00 AM Vicie Mutters, PA-C [facility] None

## 2019-10-13 ENCOUNTER — Other Ambulatory Visit: Payer: Self-pay

## 2019-10-13 ENCOUNTER — Encounter: Payer: Self-pay | Admitting: Physician Assistant

## 2019-10-13 ENCOUNTER — Ambulatory Visit (INDEPENDENT_AMBULATORY_CARE_PROVIDER_SITE_OTHER): Payer: Medicare PPO | Admitting: Physician Assistant

## 2019-10-13 VITALS — BP 120/86 | HR 100 | Temp 97.3°F | Wt 220.0 lb

## 2019-10-13 DIAGNOSIS — I77819 Aortic ectasia, unspecified site: Secondary | ICD-10-CM | POA: Diagnosis not present

## 2019-10-13 DIAGNOSIS — Z6836 Body mass index (BMI) 36.0-36.9, adult: Secondary | ICD-10-CM

## 2019-10-13 DIAGNOSIS — E039 Hypothyroidism, unspecified: Secondary | ICD-10-CM

## 2019-10-13 MED ORDER — BUPROPION HCL ER (XL) 150 MG PO TB24
150.0000 mg | ORAL_TABLET | ORAL | 0 refills | Status: DC
Start: 2019-10-13 — End: 2020-01-11

## 2019-10-13 NOTE — Patient Instructions (Signed)
Will start you on wellbutrin to try to help with mood/weight loss If you get nausea and HA after the first week please stop If you get anxious or snappy with people than stop the medication But this medication can help with energy, weight loss and mood It kicks in about 1-2 weeks And can be stopped quickly  If you do well after 1 month, you can start naltrexone at night.   If this does not help we can try phentermine/topamax combo.   General eating tips  What to Avoid . Avoid added sugars o Often added sugar can be found in processed foods such as many condiments, dry cereals, cakes, cookies, chips, crisps, crackers, candies, sweetened drinks, etc.  o Read labels and AVOID/DECREASE use of foods with the following in their ingredient list: Sugar, fructose, high fructose corn syrup, sucrose, glucose, maltose, dextrose, molasses, cane sugar, brown sugar, any type of syrup, agave nectar, etc.   . Avoid snacking in between meals- drink water or if you feel you need a snack, pick a high water content snack such as cucumbers, watermelon, or any veggie.  Marland Kitchen Avoid foods made with flour o If you are going to eat food made with flour, choose those made with whole-grains; and, minimize your consumption as much as is tolerable . Avoid processed foods o These foods are generally stocked in the middle of the grocery store.  o Focus on shopping on the perimeter of the grocery.  What to Include . Vegetables o GREEN LEAFY VEGETABLES: Kale, spinach, mustard greens, collard greens, cabbage, broccoli, etc. o OTHER: Asparagus, cauliflower, eggplant, carrots, peas, Brussel sprouts, tomatoes, bell peppers, zucchini, beets, cucumbers, etc. . Grains, seeds, and legumes o Beans: kidney beans, black eyed peas, garbanzo beans, black beans, pinto beans, etc. o Whole, unrefined grains: brown rice, barley, bulgur, oatmeal, etc. . Healthy fats  o Avoid highly processed fats such as vegetable oil o Examples of healthy  fats: avocado, olives, virgin olive oil, dark chocolate (?72% Cocoa), nuts (peanuts, almonds, walnuts, cashews, pecans, etc.) o Please still do small amount of these healthy fats, they are dense in calories.  . Low - Moderate Intake of Animal Sources of Protein o Meat sources: chicken, [place], salmon, tuna. Limit to 4 ounces of meat at one time or the size of your palm. o Consider limiting dairy sources, but when choosing dairy focus on: PLAIN [place] yogurt, cottage cheese, high-protein milk . Fruit o Choose berries

## 2019-11-01 MED ORDER — LEVOTHYROXINE SODIUM 125 MCG PO TABS: ORAL_TABLET | ORAL | 1 refills | Status: DC

## 2019-11-18 ENCOUNTER — Encounter: Payer: Medicare Other | Admitting: Physician Assistant

## 2019-11-24 NOTE — Progress Notes (Signed)
CPE AND FOLLOW UP  Assessment:   CPE 1 year Defer EKG today  Aortic dilatation ([place]) See on Echo Will repeat echo 1-2 years  Morbid obesity ([place]) BMI 36.0-36.9,adult Continue wellbutrin 150mg , add naltrexone at night Discussed diet, will restart intermittent fasting, will increase protein. Close follow up 4-6 weeks -     naltrexone (DEPADE) 50 MG tablet; Take 1 tablet (50 mg total) by mouth daily.   Screening for hematuria or proteinuria -     Urinalysis, Routine w reflex microscopic -     Microalbumin / creatinine urine ratio  Left knee pain, unspecified chronicity -     diclofenac (VOLTAREN) 75 MG EC tablet; Take 1 tablet (75 mg total) by mouth 2 (two) times daily with a meal. Will send in voltern gel to use more frequently  Neutropenia, unspecified type (HCC) -     CBC with Differential/Platelet  Mixed hyperlipidemia -     Lipid panel check lipids decrease fatty foods increase activity.   Medication management -     CBC with Differential/Platelet -     COMPLETE METABOLIC PANEL WITH GFR -     Magnesium  Vitamin D deficiency -     VITAMIN D 25 Hydroxy (Vit-D Deficiency, Fractures)  Hypothyroidism, unspecified type -     TSH Hypothyroidism-check TSH level, continue medications the same, reminded to take on an empty stomach 30-108mins before food.  Feeling better on the 125 mcg- will recheck  Allergic state, subsequent encounter Continue to monitor  Anxiety Monitor  Systolic murmur No symptoms recently Sister with history of mitral prolapse Recent normal echo   Future Appointments  Date Time Provider [place]  11/24/2020  9:30 AM Liane Comber, NP [facility] None     Subjective:   Kimberly Bowers is a 69 y.o. female who presents for CPE and 3 month follow up on hypertension, hypothyroidism, obesity, hyperlipidemia, vitamin D def.   Her blood pressure has been controlled at home, was 118/81 at home on wrist, BP: 126/84.   BMI is Body mass  index is 35.73 kg/m., she is working on diet and exercise. She started on wellbutrin and has been doing well with it. We have discussed adding naltrexone for the combination of contrave, she mainly has issues with wanting to snack before dinner and after dinner.  Wt Readings from Last 3 Encounters:  11/25/19 218 lb (98.9 kg)  10/13/19 220 lb (99.8 kg)  09/22/19 228 lb (103.4 kg)   She does not workout as much as before, she is walking daily.   She denies chest pain, shortness of breath, dizziness.  She is not on cholesterol medication and denies myalgias. Her cholesterol is at goal. The cholesterol last visit was:   Lab Results  Component Value Date   CHOL 225 (H) 06/09/2019   HDL 81 06/09/2019   LDLCALC 128 (H) 06/09/2019   TRIG 64 06/09/2019   CHOLHDL 2.8 06/09/2019   Last A1C in the office was:  Lab Results  Component Value Date   HGBA1C 4.9 11/18/2018   Patient is on Vitamin D supplement.   Has history of diverticulitis, has increased fiber.   She is on thyroid medication. Her medication was not changed last visit. She is on 170mcg every day.  She is not on a biotin.  Lab Results  Component Value Date   TSH 0.76 09/22/2019    Names of Other Physician/Practitioners you currently use: 1. [facility]- here for primary care 2. Dr.  [place], eye doctor, last visit Jan 2019 3. Dr. Orvil Feil, dentist, last visit q 6 months ago 4. [place] in Jan 2019 cataract Patient Care Team: Unk Pinto, MD as PCP - General (Internal Medicine) Sable Feil, MD as Consulting Physician (Gastroenterology) Elsie Saas, MD as Consulting Physician (Orthopedic Surgery) Megan Salon, MD as Consulting Physician (Gynecology)   Medication Review  Current Outpatient Medications (Endocrine & Metabolic):  .  levothyroxine (SYNTHROID) 125 MCG tablet, take 1 tablet by mouth daily with only water on empty stomach *no food for 30 mins, antacids/ calcium/  magnesium for 4 hrs and avoid biotin*     Current Outpatient Medications (Respiratory):  .  fexofenadine (ALLEGRA) 60 MG tablet, Take 60 mg by mouth daily as needed for allergies or rhinitis. .  mometasone (NASONEX) 50 MCG/ACT nasal spray, instill 2 sprays into each nostril once daily   Current Outpatient Medications (Analgesics):  .  diclofenac (VOLTAREN) 75 MG EC tablet, TAKE ONE TABLET BY MOUTH TWICE DAILY WITH MEALS      Current Outpatient Medications (Other):  Marland Kitchen  B Complex Vitamins (B COMPLEX PO), Take by mouth. Marland Kitchen  buPROPion (WELLBUTRIN XL) 150 MG 24 hr tablet, Take 1 tablet (150 mg total) by mouth every morning. .  Cholecalciferol (VITAMIN D) 2000 UNITS tablet, Take 2,000 Units by mouth daily. Marland Kitchen  MAGNESIUM PO, Take by mouth daily. .  Wheat Dextrin (BENEFIBER) POWD, Take by mouth 2 (two) times daily.  Current Facility-Administered Medications (Other):  .  0.9 %  sodium chloride infusion  Current Problems (verified) Patient Active Problem List   Diagnosis Date Noted  . Aortic dilatation ([place]) 10/13/2019  . Vitamin D deficiency 03/17/2014  . Medication management 03/17/2014  . Mixed hyperlipidemia 03/25/2013  . Anxiety   . Hypothyroid   . Neutropenia ([place])   . Allergy     Screening Tests Immunization History  Administered Date(s) Administered  . Influenza Split 12/08/2013  . Influenza, High Dose Seasonal PF 11/19/2014, 11/01/2016, 12/19/2017, 11/18/2018  . Influenza-Unspecified 12/29/2012, 12/19/2017  . PFIZER SARS-COV-2 Vaccination 04/04/2019, 04/23/2019  . Pneumococcal Conjugate-13 09/05/2014  . Pneumococcal Polysaccharide-23 11/02/2015  . Td 03/11/2004  . Tdap 07/08/2012  . Zoster 07/08/2012   Health Maintenance  Topic Date Due  . INFLUENZA VACCINE  10/10/2019  . URINE MICROALBUMIN  11/18/2019  . MAMMOGRAM  01/18/2020  . TETANUS/TDAP  07/09/2022  . COLONOSCOPY  11/28/2022  . DEXA SCAN  Completed  . COVID-19 Vaccine  Completed  . Hepatitis C Screening   Completed  . PNA vac Low Risk Adult  Completed     Preventative care: Last colonoscopy: 2019 Dr. Silverio Decamp EGD: 2009 Last mammogram: 01/2019- has OV, 12/2017- had left BENIGN breast biopsy Last pap smear/pelvic exam: 07/2015 DEXA:04/2015 -0.6 normal CXR 2006 CT head 12/2016  History reviewed: allergies, current medications, past family history, past medical history, past social history, past surgical history and problem list  Allergies No Known Allergies  SURGICAL HISTORY She  has a past surgical history that includes Laparoscopic gastric banding (2004); Knee arthroscopy (Left); Uterine fibroid surgery (2001); Foot surgery (Right); Tubal ligation; Laparoscopic repair and removal of gastric band; Brachioplasty (03/2016); Cataract extraction (2019); and Breast biopsy (Left, 01/2018). FAMILY HISTORY Her family history includes Cancer in her mother; Diabetes in her father. SOCIAL HISTORY She  reports that she has never smoked. She has never used smokeless tobacco. She reports current alcohol use of about 1.0 - 2.0 standard drink of alcohol per week. She reports that she does not  use drugs.   Review of Systems:  Review of Systems  Constitutional: Negative.   HENT: Negative.   Eyes: Negative.   Respiratory: Negative.   Cardiovascular: Negative.   Gastrointestinal: Negative.   Genitourinary: Negative.   Musculoskeletal: Negative.   Skin: Negative.   Neurological: Negative.   Endo/Heme/Allergies: Negative.   Psychiatric/Behavioral: The patient does not have insomnia.     Objective:   Today's Vitals   11/25/19 0901  BP: 126/84  Pulse: (!) 101  Temp: (!) 97.3 F (36.3 C)  SpO2: 97%  Weight: 218 lb (98.9 kg)  Height: 5' 5.5" (1.664 m)    General appearance: alert, no distress, WD/WN,  female HEENT: normocephalic, sclerae anicteric, TMs pearly, nares patent, no discharge or erythema, pharynx normal Oral cavity: MMM, no lesions Neck: supple, no lymphadenopathy, no  thyromegaly, no masses Heart: RRR, normal S1, S2, prominent S2 versus late systolic murmur, no radiation to carotids Lungs: CTA bilaterally, no wheezes, rhonchi, or rales Abdomen: +bs, soft, non tender, non distended, no masses, no hepatomegaly, no splenomegaly Musculoskeletal: nontender, no swelling, no obvious deformity 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  Extremities: no edema, no cyanosis, no clubbing Pulses: 2+ symmetric, upper and lower extremities, normal cap refill Neurological: alert, oriented x 3, CN2-12 intact, strength normal upper extremities and lower extremities, sensation normal throughout, DTRs 2+ throughout, no cerebellar signs, gait antalgic Psychiatric: normal affect, behavior normal, pleasant  Breast: defer Gyn: defer Rectal: defer  EKG: defer   Vicie Mutters, PA-C   11/25/19

## 2019-11-25 ENCOUNTER — Encounter: Payer: Self-pay | Admitting: Physician Assistant

## 2019-11-25 ENCOUNTER — Other Ambulatory Visit: Payer: Self-pay

## 2019-11-25 ENCOUNTER — Ambulatory Visit (INDEPENDENT_AMBULATORY_CARE_PROVIDER_SITE_OTHER): Payer: Medicare PPO | Admitting: Physician Assistant

## 2019-11-25 VITALS — BP 126/84 | HR 101 | Temp 97.3°F | Ht 65.5 in | Wt 218.0 lb

## 2019-11-25 DIAGNOSIS — F419 Anxiety disorder, unspecified: Secondary | ICD-10-CM

## 2019-11-25 DIAGNOSIS — Z0000 Encounter for general adult medical examination without abnormal findings: Secondary | ICD-10-CM | POA: Diagnosis not present

## 2019-11-25 DIAGNOSIS — R011 Cardiac murmur, unspecified: Secondary | ICD-10-CM

## 2019-11-25 DIAGNOSIS — Z6836 Body mass index (BMI) 36.0-36.9, adult: Secondary | ICD-10-CM

## 2019-11-25 DIAGNOSIS — Z79899 Other long term (current) drug therapy: Secondary | ICD-10-CM | POA: Diagnosis not present

## 2019-11-25 DIAGNOSIS — E559 Vitamin D deficiency, unspecified: Secondary | ICD-10-CM

## 2019-11-25 DIAGNOSIS — Z1389 Encounter for screening for other disorder: Secondary | ICD-10-CM

## 2019-11-25 DIAGNOSIS — N289 Disorder of kidney and ureter, unspecified: Secondary | ICD-10-CM

## 2019-11-25 DIAGNOSIS — E782 Mixed hyperlipidemia: Secondary | ICD-10-CM

## 2019-11-25 DIAGNOSIS — E039 Hypothyroidism, unspecified: Secondary | ICD-10-CM

## 2019-11-25 DIAGNOSIS — M170 Bilateral primary osteoarthritis of knee: Secondary | ICD-10-CM

## 2019-11-25 DIAGNOSIS — T7840XD Allergy, unspecified, subsequent encounter: Secondary | ICD-10-CM

## 2019-11-25 DIAGNOSIS — Z0001 Encounter for general adult medical examination with abnormal findings: Secondary | ICD-10-CM

## 2019-11-25 DIAGNOSIS — I77819 Aortic ectasia, unspecified site: Secondary | ICD-10-CM

## 2019-11-25 DIAGNOSIS — D709 Neutropenia, unspecified: Secondary | ICD-10-CM

## 2019-11-25 MED ORDER — NALTREXONE HCL 50 MG PO TABS: 50.0000 mg | ORAL_TABLET | Freq: Every day | ORAL | 1 refills | Status: DC

## 2019-11-25 MED ORDER — DICLOFENAC SODIUM 1 % EX GEL: 4.0000 g | Freq: Four times a day (QID) | CUTANEOUS | 3 refills | Status: DC

## 2019-11-25 NOTE — Patient Instructions (Addendum)
Do not take the naltrexone with an opioid, this can cause major withdraws. Monitor for anxiety, headache, GI issues, dizziness, try around lunch time or dinner time, can take up to twice a day. Start on 1/2 pill and can increase to 1 pill after 1-2 weeks if you want OR you can take 1/2 at lunch and 1/2 at dinner.    Have a planned healthy snack before dinner.  Try to eat more protein at lunch   Are you an emotional eater? Do you eat more when you're feeling stressed? Do you eat when you're not hungry or when you're full? Do you eat to feel better (to calm and soothe yourself when you're sad, mad, bored, anxious, etc.)? Do you reward yourself with food? Do you regularly eat until you've stuffed yourself? Does food make you feel safe? Do you feel like food is a friend? Do you feel powerless or out of control around food?  If you answered yes to some of these questions than it is likely that you are an emotional eater. This is normally a learned behavior and can take time to first recognize the signs and second BREAK THE HABIT. But here is more information and tips to help.   The difference between emotional hunger and physical hunger Emotional hunger can be powerful, so it's easy to mistake it for physical hunger. But there are clues you can look for to help you tell physical and emotional hunger apart.  Emotional hunger comes on suddenly. It hits you in an instant and feels overwhelming and urgent. Physical hunger, on the other hand, comes on more gradually. The urge to eat doesn't feel as dire or demand instant satisfaction (unless you haven't eaten for a very long time).  Emotional hunger craves specific comfort foods. When you're physically hungry, almost anything sounds good--including healthy stuff like vegetables. But emotional hunger craves junk food or sugary snacks that provide an instant rush. You feel like you need cheesecake or pizza, and nothing else will do.  Emotional hunger  often leads to mindless eating. Before you know it, you've eaten a whole bag of chips or an entire pint of ice cream without really paying attention or fully enjoying it. When you're eating in response to physical hunger, you're typically more aware of what you're doing.  Emotional hunger isn't satisfied once you're full. You keep wanting more and more, often eating until you're uncomfortably stuffed. Physical hunger, on the other hand, doesn't need to be stuffed. You feel satisfied when your stomach is full.  Emotional hunger isn't located in the stomach. Rather than a growling belly or a pang in your stomach, you feel your hunger as a craving you can't get out of your head. You're focused on specific textures, tastes, and smells.  Emotional hunger often leads to regret, guilt, or shame. When you eat to satisfy physical hunger, you're unlikely to feel guilty or ashamed because you're simply giving your body what it needs. If you feel guilty after you eat, it's likely because you know deep down that you're not eating for nutritional reasons.  Identify your emotional eating triggers What situations, places, or feelings make you reach for the comfort of food? Most emotional eating is linked to unpleasant feelings, but it can also be triggered by positive emotions, such as rewarding yourself for achieving a goal or celebrating a holiday or happy event. Common causes of emotional eating include:  Stuffing emotions - Eating can be a way to temporarily silence or "  stuff down" uncomfortable emotions, including anger, fear, sadness, anxiety, loneliness, resentment, and shame. While you're numbing yourself with food, you can avoid the difficult emotions you'd rather not feel.  Boredom or feelings of emptiness - Do you ever eat simply to give yourself something to do, to relieve boredom, or as a way to fill a void in your life? You feel unfulfilled and empty, and food is a way to occupy your mouth and your time.  In the moment, it fills you up and distracts you from underlying feelings of purposelessness and dissatisfaction with your life.  Childhood habits - Think back to your childhood memories of food. Did your parents reward good behavior with ice cream, take you out for pizza when you got a good report card, or serve you sweets when you were feeling sad? These habits can often carry over into adulthood. Or your eating may be driven by nostalgia--for cherished memories of grilling burgers in the backyard with your dad or baking and eating cookies with your mom.  Social influences - Getting together with other people for a meal is a great way to relieve stress, but it can also lead to overeating. It's easy to overindulge simply because the food is there or because everyone else is eating. You may also overeat in social situations out of nervousness. Or perhaps your family or circle of friends encourages you to overeat, and it's easier to go along with the group.  Stress - Ever notice how stress makes you hungry? It's not just in your mind. When stress is chronic, as it so often is in our chaotic, fast-paced world, your body produces high levels of the stress hormone, cortisol. Cortisol triggers cravings for salty, sweet, and fried foods--foods that give you a burst of energy and pleasure. The more uncontrolled stress in your life, the more likely you are to turn to food for emotional relief.  Find other ways to feed your feelings If you don't know how to manage your emotions in a way that doesn't involve food, you won't be able to control your eating habits for very long. Diets so often fail because they offer logical nutritional advice which only works if you have conscious control over your eating habits. It doesn't work when emotions hijack the process, demanding an immediate payoff with food.  In order to stop emotional eating, you have to find other ways to fulfill yourself emotionally. It's not enough to  understand the cycle of emotional eating or even to understand your triggers, although that's a huge first step. You need alternatives to food that you can turn to for emotional fulfillment.  Alternatives to emotional eating If you're depressed or lonely, call someone who always makes you feel better, play with your dog or cat, or look at a favorite photo or cherished memento.  If you're anxious, expend your nervous energy by dancing to your favorite song, squeezing a stress ball, or taking a brisk walk.  If you're exhausted, treat yourself with a hot cup of tea, take a bath, light some scented candles, or wrap yourself in a warm blanket.  If you're bored, read a good book, watch a comedy show, explore the outdoors, or turn to an activity you enjoy (woodworking, playing the guitar, shooting hoops, scrapbooking, etc.).  What is mindful eating? Mindful eating is a practice that develops your awareness of eating habits and allows you to pause between your triggers and your actions. Most emotional eaters feel powerless over their food cravings.  When the urge to eat hits, you feel an almost unbearable tension that demands to be fed, right now. Because you've tried to resist in the past and failed, you believe that your willpower just isn't up to snuff. But the truth is that you have more power over your cravings than you think.  Take 5 before you give in to a craving Emotional eating tends to be automatic and virtually mindless. Before you even realize what you're doing, you've reached for a tub of ice cream and polished off half of it. But if you can take a moment to pause and reflect when you're hit with a craving, you give yourself the opportunity to make a different decision.  Can you put off eating for five minutes? Or just start with one minute. Don't tell yourself you can't give in to the craving; remember, the forbidden is extremely tempting. Just tell yourself to wait.  While you're waiting,  check in with yourself. How are you feeling? What's going on emotionally? Even if you end up eating, you'll have a better understanding of why you did it. This can help you set yourself up for a different response next time.  How to practice mindful eating Eating while you're also doing other things--such as watching TV, driving, or playing with your phone--can prevent you from fully enjoying your food. Since your mind is elsewhere, you may not feel satisfied or continue eating even though you're no longer hungry. Eating more mindfully can help focus your mind on your food and the pleasure of a meal and curb overeating.   Eat your meals in a calm place with no distractions, aside from any dining companions.  Try eating with your non-dominant hand or using chopsticks instead of a knife and fork. Eating in such a non-familiar way can slow down how fast you eat and ensure your mind stays focused on your food.  Allow yourself enough time not to have to rush your meal. Set a timer for 20 minutes and pace yourself so you spend at least that much time eating.  Take small bites and chew them well, taking time to notice the different flavors and textures of each mouthful.  Put your utensils down between bites. Take time to consider how you feel--hungry, satiated--before picking up your utensils again.  Try to stop eating before you are full.It takes time for the signal to reach your brain that you've had enough. Don't feel obligated to always clean your plate.  When you've finished your food, take a few moments to assess if you're really still hungry before opting for an extra serving or dessert.  Learn to accept your feelings--even the bad ones  While it may seem that the core problem is that you're powerless over food, emotional eating actually stems from feeling powerless over your emotions. You don't feel capable of dealing with your feelings head on, so you avoid them with food.  Recommended  reading  Mini Habits for weight loss  Healthy Eating: A guide to the new nutrition - [employer] Report  10 Tips for Mindful Eating - How mindfulness can help you fully enjoy a meal and the experience of eating--with moderation and restraint. ([place])  Weight Loss: Gain Control of Emotional Eating - Tips to regain control of your eating habits. [facility])  Why Stress Causes People to Overeat -Tips on controlling stress eating. ([place])  Mindful Eating Meditations -Free online mindfulness meditations. ([employer] for  Mindful Eating)     Naltrexone tablets What is this medicine? NALTREXONE (nal TREX one) helps you to remain free of your dependence on opiate drugs or alcohol. It blocks the 'high' that these substances can give you. This medicine is combined with counseling and support groups. This medicine may be used for other purposes; ask your health care provider or pharmacist if you have questions. COMMON BRAND NAME(S): Depade, ReVia What should I tell my health care provider before I take this medicine? They need to know if you have any of these conditions:  if you have used drugs or alcohol within 7 to 10 days  kidney disease  liver disease, including hepatitis  an unusual or allergic reaction to naltrexone, other medicines, foods, dyes, or preservatives  pregnant or trying to get pregnant  breast-feeding How should I use this medicine? Take this medicine by mouth with a full glass of water. Follow the directions on the prescription label. Do not take this medicine within 7 to 10 days of taking any opioid drugs. Take your medicine at regular intervals. Do not take your medicine more often than directed. Do not stop taking except on your doctor's advice. Talk to your pediatrician regarding the use of this medicine in children. Special care may be needed. Overdosage: If you think you have taken too much of this  medicine contact a poison control center or emergency room at once. NOTE: This medicine is only for you. Do not share this medicine with others. What if I miss a dose? If you miss a dose and remember on the same day, take the missed dose. If you do not remember until the next day, ask your doctor or health care professional about rescheduling your doses. Do not take double or extra doses. What may interact with this medicine? Do not take this medicine with any of the following medications:  any prescription or street opioid drug like codiene, heroin, methadone This medicine may also interact with the following medications:  disulfiram  thioridazine This list may not describe all possible interactions. Give your health care provider a list of all the medicines, herbs, non-prescription drugs, or dietary supplements you use. Also tell them if you smoke, drink alcohol, or use illegal drugs. Some items may interact with your medicine. What should I watch for while using this medicine? Your condition will be monitored carefully while you are receiving this medicine. Visit your doctor or health care professional regularly. For this medicine to be most effective you should attend any counseling or support groups that your doctor or health care professional recommends. Do not try to overcome the effects of the medicine by taking large amounts of narcotics or by drinking large amounts of alcohol. This can cause severe problems including death. Also, you may be more sensitive to lower doses of narcotics after you stop taking this medicine. If you are going to have surgery, tell your doctor or health care professional that you are taking this medicine. Do not treat yourself for coughs, colds, pain, or diarrhea. Ask your doctor or health care professional for advice. Some of the ingredients may interact with this medicine and cause side effects. Wear a medical ID bracelet or chain, and carry a card that  describes your disease and details of your medicine and dosage times. You may get drowsy or dizzy. Do not drive, use machinery, or do anything that needs mental alertness until you know how this medicine affects you. Do not stand or sit up quickly,  especially if you are an older patient. This reduces the risk of dizzy or fainting spells. Alcohol may interfere with the effect of this medicine. Avoid alcoholic drinks. What side effects may I notice from receiving this medicine? Side effects that you should report to your doctor or health care professional as soon as possible:  allergic reactions like skin rash, itching or hives, swelling of the face, lips, or tongue  breathing problems  changes in vision, hearing  confusion  dark urine  depressed mood  diarrhea  fast or irregular heart beat  hallucination, loss of contact with reality  light-colored stools  right upper belly pain  suicidal thoughts or other mood changes  unusually weak or tired  vomiting  yellowing of the eyes or skin Side effects that usually do not require medical attention (report to your doctor or health care professional if they continue or are bothersome):  aches, pains  change in sex drive or performance  feeling anxious  headache  loss of appetite, nausea  runny nose, sinus problems, sneezing  stomach pain  trouble sleeping This list may not describe all possible side effects. Call your doctor for medical advice about side effects. You may report side effects to FDA at 1-800-FDA-1088. Where should I keep my medicine? Keep out of the reach of children. Store at room temperature between 20 and 25 degrees C (68 and 77 degrees F). Throw away any unused medicine after the expiration date. NOTE: This sheet is a summary. It may not cover all possible information. If you have questions about this medicine, talk to your doctor, pharmacist, or health care provider.  2020 Elsevier/Gold Standard  (2011-12-19 10:33:18)

## 2019-11-26 LAB — COMPLETE METABOLIC PANEL WITH GFR
AG Ratio: 1.8 (calc) (ref 1.0–2.5)
ALT: 14 U/L (ref 6–29)
AST: 20 U/L (ref 10–35)
Albumin: 4.4 g/dL (ref 3.6–5.1)
Alkaline phosphatase (APISO): 65 U/L (ref 37–153)
BUN/Creatinine Ratio: 17 (calc) (ref 6–22)
BUN: 17 mg/dL (ref 7–25)
CO2: 29 mmol/L (ref 20–32)
Calcium: 9.7 mg/dL (ref 8.6–10.4)
Chloride: 102 mmol/L (ref 98–110)
Creat: 0.98 mg/dL — ABNORMAL HIGH (ref 0.60–0.93)
GFR, Est African American: 67 mL/min/{1.73_m2} (ref >=60)
GFR, Est Non African American: 58 mL/min/{1.73_m2} — ABNORMAL LOW (ref >=60)
Globulin: 2.4 g/dL (calc) (ref 1.9–3.7)
Glucose, Bld: 100 mg/dL — ABNORMAL HIGH (ref 65–99)
Potassium: 4.6 mmol/L (ref 3.5–5.3)
Sodium: 138 mmol/L (ref 135–146)
Total Bilirubin: 0.7 mg/dL (ref 0.2–1.2)
Total Protein: 6.8 g/dL (ref 6.1–8.1)

## 2019-11-26 LAB — CBC WITH DIFFERENTIAL/PLATELET
Absolute Monocytes: 304 cells/uL (ref 200–950)
Basophils Absolute: 42 cells/uL (ref 0–200)
Basophils Relative: 1.1 %
Eosinophils Absolute: 80 cells/uL (ref 15–500)
Eosinophils Relative: 2.1 %
HCT: 45.3 % — ABNORMAL HIGH (ref 35.0–45.0)
Hemoglobin: 15.2 g/dL (ref 11.7–15.5)
Lymphs Abs: 657 cells/uL — ABNORMAL LOW (ref 850–3900)
MCH: 32.8 pg (ref 27.0–33.0)
MCHC: 33.6 g/dL (ref 32.0–36.0)
MCV: 97.6 fL (ref 80.0–100.0)
MPV: 9.9 fL (ref 7.5–12.5)
Monocytes Relative: 8 %
Neutro Abs: 2717 cells/uL (ref 1500–7800)
Neutrophils Relative %: 71.5 %
Platelets: 291 10*3/uL (ref 140–400)
RBC: 4.64 10*6/uL (ref 3.80–5.10)
RDW: 12.2 % (ref 11.0–15.0)
Total Lymphocyte: 17.3 %
WBC: 3.8 10*3/uL (ref 3.8–10.8)

## 2019-11-26 LAB — VITAMIN D 25 HYDROXY (VIT D DEFICIENCY, FRACTURES): Vit D, 25-Hydroxy: 56 ng/mL (ref 30–100)

## 2019-11-26 LAB — URINALYSIS, ROUTINE W REFLEX MICROSCOPIC
Bilirubin Urine: NEGATIVE
Glucose, UA: NEGATIVE
Hgb urine dipstick: NEGATIVE
Nitrite: NEGATIVE
Specific Gravity, Urine: 1.025 (ref 1.001–1.03)
Squamous Epithelial / HPF: NONE SEEN /HPF (ref <=5)
pH: 5.5 (ref 5.0–8.0)

## 2019-11-26 LAB — LIPID PANEL
Cholesterol: 207 mg/dL — ABNORMAL HIGH (ref <200)
HDL: 74 mg/dL (ref >=50)
LDL Cholesterol (Calc): 113 mg/dL (calc) — ABNORMAL HIGH
Non-HDL Cholesterol (Calc): 133 mg/dL (calc) — ABNORMAL HIGH (ref <130)
Total CHOL/HDL Ratio: 2.8 (calc) (ref <5.0)
Triglycerides: 95 mg/dL (ref <150)

## 2019-11-26 LAB — MICROALBUMIN / CREATININE URINE RATIO
Creatinine, Urine: 237 mg/dL (ref 20–275)
Microalb Creat Ratio: 12 mcg/mg creat (ref <30)
Microalb, Ur: 2.9 mg/dL

## 2019-11-26 LAB — TSH: TSH: 0.24 mIU/L — ABNORMAL LOW (ref 0.40–4.50)

## 2019-11-26 LAB — MAGNESIUM: Magnesium: 2.4 mg/dL (ref 1.5–2.5)

## 2019-11-26 NOTE — Addendum Note (Signed)
Addended by: Vicie Mutters R on: 11/26/2019 12:54 PM   Modules accepted: Orders

## 2019-11-30 ENCOUNTER — Ambulatory Visit
Admission: RE | Admit: 2019-11-30 | Discharge: 2019-11-30 | Disposition: A | Discharge status: Home / Self Care | Payer: Medicare PPO | Source: Ambulatory Visit | Attending: Physician Assistant | Admitting: Physician Assistant

## 2019-11-30 DIAGNOSIS — R809 Proteinuria, unspecified: Secondary | ICD-10-CM | POA: Diagnosis not present

## 2019-11-30 DIAGNOSIS — N289 Disorder of kidney and ureter, unspecified: Secondary | ICD-10-CM

## 2019-11-30 DIAGNOSIS — K7689 Other specified diseases of liver: Secondary | ICD-10-CM | POA: Diagnosis not present

## 2019-11-30 DIAGNOSIS — N281 Cyst of kidney, acquired: Secondary | ICD-10-CM | POA: Diagnosis not present

## 2019-11-30 DIAGNOSIS — K8020 Calculus of gallbladder without cholecystitis without obstruction: Secondary | ICD-10-CM | POA: Diagnosis not present

## 2020-01-04 ENCOUNTER — Ambulatory Visit: Payer: Medicare PPO | Admitting: Adult Health

## 2020-01-05 DIAGNOSIS — D225 Melanocytic nevi of trunk: Secondary | ICD-10-CM | POA: Diagnosis not present

## 2020-01-05 DIAGNOSIS — L219 Seborrheic dermatitis, unspecified: Secondary | ICD-10-CM | POA: Diagnosis not present

## 2020-01-05 DIAGNOSIS — D2230 Melanocytic nevi of unspecified part of face: Secondary | ICD-10-CM | POA: Diagnosis not present

## 2020-01-05 DIAGNOSIS — L578 Other skin changes due to chronic exposure to nonionizing radiation: Secondary | ICD-10-CM | POA: Diagnosis not present

## 2020-01-05 DIAGNOSIS — L821 Other seborrheic keratosis: Secondary | ICD-10-CM | POA: Diagnosis not present

## 2020-01-05 DIAGNOSIS — D2272 Melanocytic nevi of left lower limb, including hip: Secondary | ICD-10-CM | POA: Diagnosis not present

## 2020-01-05 DIAGNOSIS — Z411 Encounter for cosmetic surgery: Secondary | ICD-10-CM | POA: Diagnosis not present

## 2020-01-05 DIAGNOSIS — Z85828 Personal history of other malignant neoplasm of skin: Secondary | ICD-10-CM | POA: Diagnosis not present

## 2020-01-07 NOTE — Progress Notes (Signed)
Assessment and Plan:  Kimberly Bowers was seen today for follow-up.  Diagnoses and all orders for this visit:  Morbid obesity ([place]) Long discussion (15+ min) about weight loss, diet, and exercise; doing well with wellbutrin; didn't see improvement with naltrexone, prefers to stop; d/c'd today, she will taper over 2 weeks Recommended diet heavy in fruits and veggies and low in animal meats, cheeses, and dairy products, appropriate calorie intake Patient will work on increasing water intake, have plan for stress, intuitive eating discussed, increase fiber Discussed appropriate weight for height and initial goal (< 200 lb) Follow up at next visit  Aortic dilatation ([place]) Monitor; control BP; follow up in 1 year for trend  Gallstones Denies sx; discussed lower fat diet, monitor for concerning sx  Hypothyroidism, unspecified type continue medications the same pending lab results reminded to take on an empty stomach 30-42mins before food.  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 Discussed med's effects and SE's.   Over 30 minutes of exam, counseling, chart review, and critical decision making was performed.   Future Appointments  Date Time Provider [place]  11/24/2020  9:30 AM Liane Comber, NP [facility] None    ------------------------------------------------------------------------------------------------------------------   HPI BP 120/88    Pulse 75    Temp (!) 97.5 F (36.4 C)    Wt 215 lb (97.5 kg)    LMP 03/11/2000    SpO2 95%    BMI 35.23 kg/m   69 y.o.female presents for 6 week follow up on obesity.   she is prescribed wellbutrin/naltrexone for weight loss.  While on the medication they have lost 3 lbs since last visit.  They deny palpitations, anxiety, trouble sleeping,  elevated BP or other concern with SE.   BMI is Body mass index is 35.23 kg/m., She gained 45+ lb over pandemic, up from 180 lb to peak 229 lb in 05/2019 and was initiated on wellbutrin  she is working on diet and exercise. Recent ECHO showed mild aortic dilation of 38 mm that will be monitored.  Wt Readings from Last 3 Encounters:  01/10/20 215 lb (97.5 kg)  11/25/19 218 lb (98.9 kg)  10/13/19 220 lb (99.8 kg)   Typical breakfast: was doing intermittent fasting, recently doing oatmeal or kashi with fruit, coffee with splenda and touch of half and half Typical lunch: salad/greens with chicken/salmon or veggie wraps, veggie burger  Snack: handful of almonds and apricots,  Typical dinner: Baked chicken, brussels sprouts,  Exercise: was dancing, hasn't been back since covid 19 but planning to try, does walk her 2 dogs regularly, does free weights 3 days a week. occasional exercise video Water intake: keeps a glass with her all day, gets 4-6 glasses   She is on thyroid medication. Her medication was changed last visit. Taking synthroid 125 mcg in 3-4 in AM, taking whole tab daily except 1/2 tab Sunday.   Lab Results  Component Value Date   TSH 0.24 (L) 11/25/2019   Renals functions were trending down; had slight protein, no microalbumin, had normal renal US 11/2019 but did incidentally show gallstones. She denies any concerning sx today.  Takes voltaren tabs rarely, none recently. Admits to poor water intake, 2-3 bottles per day, admits hasn't had any today, coffee only this AM Lab Results  Component Value Date   GFRNONAA 58 (L) 11/25/2019   GFRNONAA 63 08/24/2019   GFRNONAA 68 06/09/2019   Lab Results  Component Value Date   CREATININE 0.98 (H) 11/25/2019   CREATININE 0.91 08/24/2019   CREATININE 0.86 06/09/2019     Past Medical History:  Diagnosis Date   Allergy    Anxiety    Arthritis    Diverticulitis 2004   Hypothyroid    Neutropenia ([facility])      No Known  Allergies  Current Outpatient Medications on File Prior to Visit  Medication Sig   B Complex Vitamins (B COMPLEX PO) Take by mouth.   buPROPion (WELLBUTRIN XL) 150 MG 24 hr tablet Take 1 tablet (150 mg total) by mouth every morning.   Cholecalciferol (VITAMIN D) 2000 UNITS tablet Take 2,000 Units by mouth daily.   diclofenac (VOLTAREN) 75 MG EC tablet TAKE ONE TABLET BY MOUTH TWICE DAILY WITH MEALS    diclofenac Sodium (VOLTAREN) 1 % GEL Apply 4 g topically 4 (four) times daily.   fexofenadine (ALLEGRA) 60 MG tablet Take 60 mg by mouth daily as needed for allergies or rhinitis.   levothyroxine (SYNTHROID) 125 MCG tablet take 1 tablet by mouth daily with only water on empty stomach *no food for 30 mins, antacids/ calcium/ magnesium for 4 hrs and avoid biotin*   MAGNESIUM PO Take by mouth daily.   mometasone (NASONEX) 50 MCG/ACT nasal spray instill 2 sprays into each nostril once daily   Wheat Dextrin (BENEFIBER) POWD Take by mouth 2 (two) times daily.   Current Facility-Administered Medications on File Prior to Visit  Medication   0.9 %  sodium chloride infusion    ROS: all negative except above.   Physical Exam:  BP 120/88    Pulse 75    Temp (!) 97.5 F (36.4 C)    Wt 215 lb (97.5 kg)    LMP 03/11/2000    SpO2 95%    BMI 35.23 kg/m   General Appearance: Well nourished, in no apparent distress. Eyes: PERRLA, EOMs, conjunctiva no swelling or erythema Sinuses: No Frontal/maxillary tenderness ENT/Mouth: Ext aud canals clear, TMs without erythema, bulging. No erythema, swelling, or exudate on post pharynx.  Tonsils not swollen or erythematous. Hearing normal.  Neck: Supple, thyroid normal.  Respiratory: Respiratory effort normal, BS equal bilaterally without rales, rhonchi, wheezing or stridor.  Cardio: RRR with no MRGs. Brisk peripheral pulses without edema.  Abdomen: Soft, + BS.  Non tender, no guarding, rebound, hernias, masses. Lymphatics: Non tender without  lymphadenopathy.  Musculoskeletal: Full ROM, 5/5 strength, normal gait.  Skin: Warm, dry without rashes, lesions, ecchymosis.  Neuro: Cranial nerves intact. Normal muscle tone, no cerebellar symptoms. Sensation intact.  Psych: Awake and oriented X 3, normal affect, Insight and Judgment appropriate.     Izora Ribas, NP 12:07 PM [facility]

## 2020-01-10 ENCOUNTER — Other Ambulatory Visit: Payer: Self-pay

## 2020-01-10 ENCOUNTER — Encounter: Payer: Self-pay | Admitting: Adult Health

## 2020-01-10 ENCOUNTER — Ambulatory Visit: Payer: Medicare PPO | Admitting: Adult Health

## 2020-01-10 DIAGNOSIS — N289 Disorder of kidney and ureter, unspecified: Secondary | ICD-10-CM | POA: Diagnosis not present

## 2020-01-10 DIAGNOSIS — K8020 Calculus of gallbladder without cholecystitis without obstruction: Secondary | ICD-10-CM | POA: Diagnosis not present

## 2020-01-10 DIAGNOSIS — E782 Mixed hyperlipidemia: Secondary | ICD-10-CM

## 2020-01-10 DIAGNOSIS — I77819 Aortic ectasia, unspecified site: Secondary | ICD-10-CM

## 2020-01-10 DIAGNOSIS — E039 Hypothyroidism, unspecified: Secondary | ICD-10-CM

## 2020-01-10 NOTE — Patient Instructions (Addendum)
Goals    . DIET - EAT MORE FRUITS AND VEGETABLES     Aim for 7+ 1/2 cup servings daily     . DIET - INCREASE WATER INTAKE     65+ fluid ounces daily; aim to drink a full glass prior to coffee        Nuts/seeds/beans > fatty fish (salmon, etc) > poultry > red meat > processed meat  Try to lean towards as much to the left of the scale as possible   General weight loss tips    Drink 1/2 your body weight in fluid ounces of water daily; drink a tall glass of water 30 min before meals  Before eating, ask yourself if you are hungry, bored, stressed or even just thirsty   Always eat at the table; avoid eating while distracted  Don't eat until you're stuffed- eat slowly, listen to your stomach and eat until you are 80% full   Aim for each meal or snack to have fiber + protein + healthy fats = satisfying   Try eating off of a salad plate; wait 10 min after finishing before going back for seconds  Start by eating the vegetables on your plate; aim for 50% of your meals to be fruits and vegetables  Then eat your protein - lean meats (grass fed if possible), fish, beans, nuts/seeds in moderation  Eat your carbs/starch last ONLY if you still are hungry, and choose high fiber options as much as possible (brown rice, oats, quinoa, farro, etc). If you get full before finishing, don't feel bad about leaving some on your plate  Avoid sugar, flour, processed - the closer it looks to it's original form in nature, typically the better it is for you, and will avoid triggering a glucose spike that will store your calories as fat and make you more hungry  Splurge in moderation if needed - "assign" meals when you get to splurge and have the "bad stuff" - I like to follow a 80% - 20% plan- "healthy" choices 80 % of the time, "splurge" choices in moderation 20% of the time  Simple equation is: Calories out > calories in = weight loss - even if you eat the bad stuff, if you limit portions, you will still  lose weight, just not as fast  Make sustainable changes - this is a journey to build lifelong healthy habits! A healthy change should help you feel energized, focused and light on your feet. If you make a change and you feel miserable doing it, please ask for help :)    A great goal to work towards is aiming to get in a serving daily of some of the most nutritionally dense foods - G- BOMBS daily        High-Fiber Diet Fiber, also called dietary fiber, is a type of carbohydrate that is found in fruits, vegetables, whole grains, and beans. A high-fiber diet can have many health benefits. Your health care provider may recommend a high-fiber diet to help:  Prevent constipation. Fiber can make your bowel movements more regular.  Lower your cholesterol.  Relieve the following conditions: ? Swelling of veins in the anus (hemorrhoids). ? Swelling and irritation (inflammation) of specific areas of the digestive tract (uncomplicated diverticulosis). ? A problem of the large intestine (colon) that sometimes causes pain and diarrhea (irritable bowel syndrome, IBS).  Prevent overeating as part of a weight-loss plan.  Prevent heart disease, type 2 diabetes, and certain cancers. What is my plan?  The recommended daily fiber intake in grams (g) includes:  38 g for men age 40 or younger.  30 g for men over age 4.  69 g for women age 31 or younger.  21 g for women over age 66. You can get the recommended daily intake of dietary fiber by:  Eating a variety of fruits, vegetables, grains, and beans.  Taking a fiber supplement, if it is not possible to get enough fiber through your diet. What do I need to know about a high-fiber diet?  It is better to get fiber through food sources rather than from fiber supplements. There is not a lot of research about how effective supplements are.  Always check the fiber content on the nutrition facts label of any prepackaged food. Look for foods that  contain 5 g of fiber or more per serving.  Talk with a diet and nutrition specialist (dietitian) if you have questions about specific foods that are recommended or not recommended for your medical condition, especially if those foods are not listed below.  Gradually increase how much fiber you consume. If you increase your intake of dietary fiber too quickly, you may have bloating, cramping, or gas.  Drink plenty of water. Water helps you to digest fiber. What are tips for following this plan?  Eat a wide variety of high-fiber foods.  Make sure that half of the grains that you eat each day are whole grains.  Eat breads and cereals that are made with whole-grain flour instead of refined flour or white flour.  Eat brown rice, bulgur wheat, or millet instead of white rice.  Start the day with a breakfast that is high in fiber, such as a cereal that contains 5 g of fiber or more per serving.  Use beans in place of meat in soups, salads, and pasta dishes.  Eat high-fiber snacks, such as berries, raw vegetables, nuts, and popcorn.  Choose whole fruits and vegetables instead of processed forms like juice or sauce. What foods can I eat?  Fruits Berries. Pears. Apples. Oranges. Avocado. Prunes and raisins. Dried figs. Vegetables Sweet potatoes. Spinach. Kale. Artichokes. Cabbage. Broccoli. Cauliflower. Green peas. Carrots. Squash. Grains Whole-grain breads. Multigrain cereal. Oats and oatmeal. Brown rice. Barley. Bulgur wheat. [place]. Quinoa. Bran muffins. Popcorn. Rye wafer crackers. Meats and other proteins Navy, kidney, and pinto beans. Soybeans. Split peas. Lentils. Nuts and seeds. Dairy Fiber-fortified yogurt. Beverages Fiber-fortified soy milk. Fiber-fortified orange juice. Other foods Fiber bars. The items listed above may not be a complete list of recommended foods and beverages. Contact a dietitian for more options. What foods are not recommended? Fruits Fruit juice.  Cooked, strained fruit. Vegetables Fried potatoes. Canned vegetables. Well-cooked vegetables. Grains White bread. Pasta made with refined flour. White rice. Meats and other proteins Fatty cuts of meat. Fried chicken or fried fish. Dairy Milk. Yogurt. Cream cheese. Sour cream. Fats and oils Butters. Beverages Soft drinks. Other foods Cakes and pastries. The items listed above may not be a complete list of foods and beverages to avoid. Contact a dietitian for more information. Summary  Fiber is a type of carbohydrate. It is found in fruits, vegetables, whole grains, and beans.  There are many health benefits of eating a high-fiber diet, such as preventing constipation, lowering blood cholesterol, helping with weight loss, and reducing your risk of heart disease, diabetes, and certain cancers.  Gradually increase your intake of fiber. Increasing too fast can result in cramping, bloating, and gas. Drink plenty of water  while you increase your fiber.  The best sources of fiber include whole fruits and vegetables, whole grains, nuts, seeds, and beans. This information is not intended to replace advice given to you by your health care provider. Make sure you discuss any questions you have with your health care provider. Document Revised: 12/30/2016 Document Reviewed: 12/30/2016 Elsevier Patient Education  2020 [employer].

## 2020-01-11 ENCOUNTER — Encounter: Payer: Self-pay | Admitting: Adult Health

## 2020-01-11 ENCOUNTER — Other Ambulatory Visit: Payer: Self-pay

## 2020-01-11 DIAGNOSIS — N183 Chronic kidney disease, stage 3 unspecified: Secondary | ICD-10-CM

## 2020-01-11 DIAGNOSIS — N182 Chronic kidney disease, stage 2 (mild): Secondary | ICD-10-CM | POA: Insufficient documentation

## 2020-01-11 HISTORY — DX: Chronic kidney disease, stage 3 unspecified: N18.30

## 2020-01-11 LAB — BASIC METABOLIC PANEL WITH GFR
BUN/Creatinine Ratio: 21 (calc) (ref 6–22)
BUN: 20 mg/dL (ref 7–25)
CO2: 27 mmol/L (ref 20–32)
Calcium: 9.8 mg/dL (ref 8.6–10.4)
Chloride: 100 mmol/L (ref 98–110)
Creat: 0.97 mg/dL — ABNORMAL HIGH (ref 0.60–0.93)
GFR, Est African American: 68 mL/min/{1.73_m2} (ref >=60)
GFR, Est Non African American: 59 mL/min/{1.73_m2} — ABNORMAL LOW (ref >=60)
Glucose, Bld: 93 mg/dL (ref 65–99)
Potassium: 4.8 mmol/L (ref 3.5–5.3)
Sodium: 136 mmol/L (ref 135–146)

## 2020-01-11 LAB — TSH: TSH: 0.45 mIU/L (ref 0.40–4.50)

## 2020-01-11 MED ORDER — BUPROPION HCL ER (XL) 150 MG PO TB24: 150.0000 mg | ORAL_TABLET | ORAL | 0 refills | Status: DC

## 2020-01-12 ENCOUNTER — Other Ambulatory Visit: Payer: Self-pay | Admitting: Adult Health

## 2020-01-12 DIAGNOSIS — N1831 Chronic kidney disease, stage 3a: Secondary | ICD-10-CM

## 2020-01-24 DIAGNOSIS — Z1231 Encounter for screening mammogram for malignant neoplasm of breast: Secondary | ICD-10-CM | POA: Diagnosis not present

## 2020-01-24 LAB — HM MAMMOGRAPHY

## 2020-01-25 ENCOUNTER — Encounter: Payer: Self-pay | Admitting: Internal Medicine

## 2020-02-09 ENCOUNTER — Ambulatory Visit: Payer: Medicare PPO

## 2020-02-09 NOTE — Progress Notes (Signed)
Assessment and Plan:  Brantlee was seen today for follow-up.  Diagnoses and all orders for this visit:  Morbid obesity ([place]) Continued benefit with wellbutrin Recommended diet heavy in fruits and veggies and low in animal meats, cheeses, and dairy products, appropriate calorie intake Patient will work on increasing water intake, have plan for stress, intuitive eating discussed, increase fiber Discussed appropriate weight for height and initial goal (< 200 lb) Follow up at next visit  on an empty stomach 30-52mins before food.   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 Eustachian tube dysfunction, unclear etiology  Check fexofenadine at home, increase to 180 mg daily if not already doing so autoinflation, explained no need for ABX at this time.  - if not better in the next month will plan on referral to ENT for evaluation   Further disposition pending results of labs. Discussed med's effects and SE's.   Over 30 minutes of exam, counseling, chart review, and critical decision making was performed.   Future Appointments  Date Time Provider [place]  07/12/2020  9:30 AM Liane Comber, NP [facility] None  11/24/2020  9:30 AM Liane Comber, NP [facility] None    ------------------------------------------------------------------------------------------------------------------   HPI BP 112/80   Pulse 87   Temp (!) 96.4 F (35.8 C)   Wt 214 lb 12.8 oz (97.4 kg)   LMP 03/11/2000   SpO2 95%   BMI 35.20 kg/m   69 y.o.female presents for 1 month follow up on renal functions, morbid obesity and requesting ear irritation.   She reports persistent R ear fullness for 6 months since June 2021, had ear irrigation and improved at one point, now using debrox for maintenance, but not fully  resolving and uncomfortable, constant sense that she needs to pop her ear, but can't resolve this. She takes fexofenadine daily, also mometasone nasal spray daily. Hasn't seemed to help. Denies changes in hearing, pain, discharge, tinnitis, dizziness, rhinitis/congestion, sore throat.   BMI is Body mass index is 35.2 kg/m., She gained 45+ lb over pandemic, up from 180 lb to peak 229 lb in 05/2019 and was initiated on wellbutrin  she is working on diet and exercise.  Wt Readings from Last 3 Encounters:  02/10/20 214 lb 12.8 oz (97.4 kg)  01/10/20 215 lb (97.5 kg)  11/25/19 218 lb (98.9 kg)   Renals functions were trending down; had slight protein, no microalbumin, had normal renal US 11/2019 but did incidentally show gallstones. Was taking voltaren tabs rarely, stopped completely. Since last visit she has been intentionally pushing water intake starting early in AM prior to coffee.  Lab Results  Component Value Date   ZJQBHALP 37 (L) 01/10/2020   GFRNONAA 58 (L) 11/25/2019   GFRNONAA 63 08/24/2019   Lab Results  Component Value Date   CREATININE 0.97 (H) 01/10/2020   CREATININE 0.98 (H) 11/25/2019   CREATININE 0.91 08/24/2019     Past Medical History:  Diagnosis Date  . Allergy   . Anxiety   . Arthritis   . Diverticulitis 2004  . Hypothyroid   . Neutropenia ([place])      No Known Allergies  Current Outpatient Medications on File Prior to Visit  Medication Sig  . B Complex Vitamins (B COMPLEX PO) Take by mouth.  Marland Kitchen buPROPion (WELLBUTRIN XL)  150 MG 24 hr tablet Take 1 tablet (150 mg total) by mouth every morning.  . Cholecalciferol (VITAMIN D) 2000 UNITS tablet Take 2,000 Units by mouth daily.  . fexofenadine (ALLEGRA) 60 MG tablet Take 60 mg by mouth daily as needed for allergies or rhinitis.  Marland Kitchen levothyroxine (SYNTHROID) 125 MCG tablet take 1 tablet by mouth daily with only water on empty stomach *no food for 30 mins, antacids/ calcium/ magnesium for 4 hrs and avoid biotin*  .  MAGNESIUM PO Take by mouth daily.  . mometasone (NASONEX) 50 MCG/ACT nasal spray instill 2 sprays into each nostril once daily  . Wheat Dextrin (BENEFIBER) POWD Take by mouth 2 (two) times daily.  . diclofenac (VOLTAREN) 75 MG EC tablet TAKE ONE TABLET BY MOUTH TWICE DAILY WITH MEALS  (Patient not taking: Reported on 02/10/2020)  . diclofenac Sodium (VOLTAREN) 1 % GEL Apply 4 g topically 4 (four) times daily. (Patient not taking: Reported on 02/10/2020)   Current Facility-Administered Medications on File Prior to Visit  Medication  . 0.9 %  sodium chloride infusion    ROS: all negative except above.   Physical Exam:  BP 112/80   Pulse 87   Temp (!) 96.4 F (35.8 C)   Wt 214 lb 12.8 oz (97.4 kg)   LMP 03/11/2000   SpO2 95%   BMI 35.20 kg/m   General Appearance: Well nourished, in no apparent distress. Eyes: PERRLA, EOMs, conjunctiva no swelling or erythema Sinuses: No Frontal/maxillary tenderness ENT/Mouth: Ext aud canals clear, TMs without erythema, bulging. No erythema, swelling, or exudate on post pharynx.  Tonsils not swollen or erythematous. Hearing normal. She does have bil TMJ tenderness but without dislocation, popping, clicking.  Neck: Supple, thyroid normal.  Respiratory: Respiratory effort normal, BS equal bilaterally without rales, rhonchi, wheezing or stridor.  Cardio: RRR with no MRGs. Brisk peripheral pulses without edema.  Abdomen: Soft, + BS.  Non tender, no guarding, rebound, hernias, masses. Lymphatics: Non tender without lymphadenopathy.  Musculoskeletal: Full ROM, 5/5 strength, normal gait.  Skin: Warm, dry without rashes, lesions, ecchymosis.  Neuro: Cranial nerves intact. Normal muscle tone, no cerebellar symptoms. Sensation intact.  Psych: Awake and oriented X 3, normal affect, Insight and Judgment appropriate.     Izora Ribas, NP 10:00 AM [facility]

## 2020-02-10 ENCOUNTER — Ambulatory Visit: Payer: Medicare PPO | Admitting: Adult Health

## 2020-02-10 ENCOUNTER — Encounter: Payer: Self-pay | Admitting: Adult Health

## 2020-02-10 ENCOUNTER — Other Ambulatory Visit: Payer: Self-pay

## 2020-02-10 DIAGNOSIS — H938X1 Other specified disorders of right ear: Secondary | ICD-10-CM

## 2020-02-10 DIAGNOSIS — Z79899 Other long term (current) drug therapy: Secondary | ICD-10-CM

## 2020-02-10 DIAGNOSIS — R809 Proteinuria, unspecified: Secondary | ICD-10-CM | POA: Diagnosis not present

## 2020-02-10 DIAGNOSIS — N1831 Chronic kidney disease, stage 3a: Secondary | ICD-10-CM | POA: Diagnosis not present

## 2020-02-10 NOTE — Patient Instructions (Signed)
Eustachian Tube Dysfunction ° °Eustachian tube dysfunction refers to a condition in which a blockage develops in the narrow passage that connects the middle ear to the back of the nose (eustachian tube). The eustachian tube regulates air pressure in the middle ear by letting air move between the ear and nose. It also helps to drain fluid from the middle ear space. °Eustachian tube dysfunction can affect one or both ears. When the eustachian tube does not function properly, air pressure, fluid, or both can build up in the middle ear. °What are the causes? °This condition occurs when the eustachian tube becomes blocked or cannot open normally. Common causes of this condition include: °· Ear infections. °· Colds and other infections that affect the nose, mouth, and throat (upper respiratory tract). °· Allergies. °· Irritation from cigarette smoke. °· Irritation from stomach acid coming up into the esophagus (gastroesophageal reflux). The esophagus is the tube that carries food from the mouth to the stomach. °· Sudden changes in air pressure, such as from descending in an airplane or scuba diving. °· Abnormal growths in the nose or throat, such as: °? Growths that line the nose (nasal polyps). °? Abnormal growth of cells (tumors). °? Enlarged tissue at the back of the throat (adenoids). °What increases the risk? °You are more likely to develop this condition if: °· You smoke. °· You are overweight. °· You are a child who has: °? Certain birth defects of the mouth, such as cleft palate. °? Large tonsils or adenoids. °What are the signs or symptoms? °Common symptoms of this condition include: °· A feeling of fullness in the ear. °· Ear pain. °· Clicking or popping noises in the ear. °· Ringing in the ear. °· Hearing loss. °· Loss of balance. °· Dizziness. °Symptoms may get worse when the air pressure around you changes, such as when you travel to an area of high elevation, fly on an airplane, or go scuba diving. °How is  this diagnosed? °This condition may be diagnosed based on: °· Your symptoms. °· A physical exam of your ears, nose, and throat. °· Tests, such as those that measure: °? The movement of your eardrum (tympanogram). °? Your hearing (audiometry). °How is this treated? °Treatment depends on the cause and severity of your condition. °· In mild cases, you may relieve your symptoms by moving air into your ears. This is called "popping the ears." °· In more severe cases, or if you have symptoms of fluid in your ears, treatment may include: °? Medicines to relieve congestion (decongestants). °? Medicines that treat allergies (antihistamines). °? Nasal sprays or ear drops that contain medicines that reduce swelling (steroids). °? A procedure to drain the fluid in your eardrum (myringotomy). In this procedure, a small tube is placed in the eardrum to: °§ Drain the fluid. °§ Restore the air in the middle ear space. °? A procedure to insert a balloon device through the nose to inflate the opening of the eustachian tube (balloon dilation). °Follow these instructions at home: °Lifestyle °· Do not do any of the following until your health care provider approves: °? Travel to high altitudes. °? Fly in airplanes. °? Work in a pressurized cabin or room. °? Scuba dive. °· Do not use any products that contain nicotine or tobacco, such as cigarettes and e-cigarettes. If you need help quitting, ask your health care provider. °· Keep your ears dry. Wear fitted earplugs during showering and bathing. Dry your ears completely after. °General instructions °· Take over-the-counter   and prescription medicines only as told by your health care provider. °· Use techniques to help pop your ears as recommended by your health care provider. These may include: °? Chewing gum. °? Yawning. °? Frequent, forceful swallowing. °? Closing your mouth, holding your nose closed, and gently blowing as if you are trying to blow air out of your nose. °· Keep all  follow-up visits as told by your health care provider. This is important. °Contact a health care provider if: °· Your symptoms do not go away after treatment. °· Your symptoms come back after treatment. °· You are unable to pop your ears. °· You have: °? A fever. °? Pain in your ear. °? Pain in your head or neck. °? Fluid draining from your ear. °· Your hearing suddenly changes. °· You become very dizzy. °· You lose your balance. °Summary °· Eustachian tube dysfunction refers to a condition in which a blockage develops in the eustachian tube. °· It can be caused by ear infections, allergies, inhaled irritants, or abnormal growths in the nose or throat. °· Symptoms include ear pain, hearing loss, or ringing in the ears. °· Mild cases are treated with maneuvers to unblock the ears, such as yawning or ear popping. °· Severe cases are treated with medicines. Surgery may also be done (rare). °This information is not intended to replace advice given to you by your health care provider. Make sure you discuss any questions you have with your health care provider. °Document Revised: 06/17/2017 Document Reviewed: 06/17/2017 °Elsevier Patient Education © 2020 [employer] ° °

## 2020-02-11 LAB — URINALYSIS, ROUTINE W REFLEX MICROSCOPIC
Bilirubin Urine: NEGATIVE
Glucose, UA: NEGATIVE
Hgb urine dipstick: NEGATIVE
Ketones, ur: NEGATIVE
Leukocytes,Ua: NEGATIVE
Nitrite: NEGATIVE
Protein, ur: NEGATIVE
Specific Gravity, Urine: 1.005 (ref 1.001–1.03)
pH: 5.5 (ref 5.0–8.0)

## 2020-02-11 LAB — BASIC METABOLIC PANEL WITH GFR
BUN/Creatinine Ratio: 18 (calc) (ref 6–22)
BUN: 17 mg/dL (ref 7–25)
CO2: 23 mmol/L (ref 20–32)
Calcium: 9.7 mg/dL (ref 8.6–10.4)
Chloride: 103 mmol/L (ref 98–110)
Creat: 0.97 mg/dL — ABNORMAL HIGH (ref 0.60–0.93)
GFR, Est African American: 68 mL/min/{1.73_m2} (ref >=60)
GFR, Est Non African American: 59 mL/min/{1.73_m2} — ABNORMAL LOW (ref >=60)
Glucose, Bld: 98 mg/dL (ref 65–99)
Potassium: 4.6 mmol/L (ref 3.5–5.3)
Sodium: 140 mmol/L (ref 135–146)

## 2020-02-17 ENCOUNTER — Other Ambulatory Visit: Payer: Self-pay | Admitting: Internal Medicine

## 2020-02-17 MED ORDER — BUPROPION HCL ER (XL) 150 MG PO TB24
ORAL_TABLET | ORAL | 0 refills | Status: DC
Start: 2020-02-17 — End: 2020-02-17

## 2020-02-17 MED ORDER — BUPROPION HCL ER (XL) 300 MG PO TB24
ORAL_TABLET | ORAL | 0 refills | Status: DC
Start: 2020-02-17 — End: 2021-01-16

## 2020-05-12 ENCOUNTER — Other Ambulatory Visit: Payer: Self-pay | Admitting: Internal Medicine

## 2020-05-12 MED ORDER — LEVOTHYROXINE SODIUM 125 MCG PO TABS
ORAL_TABLET | ORAL | 1 refills | Status: DC
Start: 2020-05-12 — End: 2020-11-11

## 2020-06-07 ENCOUNTER — Other Ambulatory Visit: Payer: Self-pay | Admitting: Adult Health

## 2020-06-07 DIAGNOSIS — H938X1 Other specified disorders of right ear: Secondary | ICD-10-CM

## 2020-06-26 DIAGNOSIS — H6121 Impacted cerumen, right ear: Secondary | ICD-10-CM | POA: Diagnosis not present

## 2020-06-26 DIAGNOSIS — H9011 Conductive hearing loss, unilateral, right ear, with unrestricted hearing on the contralateral side: Secondary | ICD-10-CM | POA: Diagnosis not present

## 2020-06-26 DIAGNOSIS — H6981 Other specified disorders of Eustachian tube, right ear: Secondary | ICD-10-CM | POA: Diagnosis not present

## 2020-07-04 DIAGNOSIS — M1712 Unilateral primary osteoarthritis, left knee: Secondary | ICD-10-CM | POA: Diagnosis not present

## 2020-07-10 NOTE — Progress Notes (Signed)
AWV AND FOLLOW UP  Assessment:   Encounter for annual medicare visit Due annually   Aortic dilatation ([place]) See on Echo 2021  Control BP  CTA in the next year, 1-2 years from last for interval monitoring, wants to push out to later in the year  Morbid obesity ([place]) Continue wellbutrin 150mg  with benefit  Discussed diet, will restart intermittent fasting, will increase protein.  Mixed hyperlipidemia -     Lipid panel check lipids decrease fatty foods increase activity.   CKD IIIa (HCC) Increase fluids, avoid NSAIDS, monitor sugars, will monitor  Medication management -     CBC with Differential/Platelet -     COMPLETE METABOLIC PANEL WITH GFR -     Magnesium  Vitamin D deficiency -     VITAMIN D 25 Hydroxy (Vit-D Deficiency, Fractures)  Hypothyroidism, unspecified type -     TSH Hypothyroidism-check TSH level, continue medications the same, reminded to take on an empty stomach 30-78mins before food.  Feeling better on the 125 mcg- will recheck  Allergic state, subsequent encounter Continue to monitor  Anxiety Monitor  Systolic murmur No symptoms recently Sister with history of mitral prolapse Recent normal echo   Future Appointments  Date Time Provider [place]  11/24/2020  9:30 AM Liane Comber, NP [facility] None  07/12/2021  9:30 AM Liane Comber, NP [facility] None     Subjective:   Kimberly Bowers is a 70 y.o. female who presents for AWV and 3 month follow up on hypertension, hypothyroidism, obesity, hyperlipidemia, vitamin D def.   She reports persistent R ear fullness for 6 months since June 2021, had ear irrigation and improved at one point, now using debrox for maintenance, but not fully resolving and saw ENT, no abnormal findings. She was taking fexofenadine and nasal steroid daily without improvement.   Left knee arthritis, follows with [facility], had mechanical fall recently, had cortisone shot and doing strengthening exrcises  BMI  is Body mass index is 35.23 kg/m., she is working on diet and exercise, walking, doing free weights and purchased recumbent bike. She started on wellbutrin and naltrexone, hasn't noted benefit and stopped, Wellbutrin caused some sleep problems. Didn't tolerate phentermine remotely. Following mediterranean diet.  Wt Readings from Last 3 Encounters:  07/12/20 215 lb (97.5 kg)  02/10/20 214 lb 12.8 oz (97.4 kg)  01/10/20 215 lb (97.5 kg)   She had ECHO 09/2019 with EF 72-53%, grade 1 dystolic dysfunction, did show mild ascending aortic dilation of 38 mm, planning follow up imaging in 1-2 years.   Her blood pressure has been controlled at home, today their BP is BP: 124/84 She does workout, she is walking daily, doing free weights.  She denies chest pain, shortness of breath, dizziness.   She is not on cholesterol medication and denies myalgias. Her cholesterol is at goal. The cholesterol last visit was:   Lab Results  Component Value Date   CHOL 207 (H) 11/25/2019   HDL 74 11/25/2019   LDLCALC 113 (H) 11/25/2019   TRIG 95 11/25/2019   CHOLHDL 2.8 11/25/2019   Last A1C in the office was:  Lab Results  Component Value Date   HGBA1C 4.9 11/18/2018   She has CKD III, stable, monitored at this office; normal renal ultrasound in 11/2019, did incidentally show gallstones, has stopped diclofenac oral.  Lab Results  Component Value Date   GFRNONAA 59 (L) 02/10/2020   GFRNONAA 59 (L) 01/10/2020   GFRNONAA 58 (L) 11/25/2019   Lab Results  Component  Value Date   MICROALBUR 2.9 11/25/2019   MICROALBUR 2.2 11/18/2018   Patient is on Vitamin D supplement.   Lab Results  Component Value Date   VD25OH 56 11/25/2019     She is on thyroid medication. Her medication was not changed last visit. She is on 165mcg every day.  She is not on a biotin.  Lab Results  Component Value Date   TSH 0.45 01/10/2020     Medication Review  Current Outpatient Medications (Endocrine & Metabolic):  .   levothyroxine (SYNTHROID) 125 MCG tablet, Take  1 tablet  Daily  on an empty stomach with only water for 30 minutes & no Antacid meds, Calcium or Magnesium for 4 hours & avoid Biotin (Patient taking differently: Take  1 tablet  Daily  on an empty stomach with only water for 30 minutes & no Antacid meds, Calcium or Magnesium for 4 hours & avoid Biotin Takes brand name (synthroid))   Current Outpatient Medications (Respiratory):  .  fexofenadine (ALLEGRA) 60 MG tablet, Take 60 mg by mouth daily as needed for allergies or rhinitis. .  mometasone (NASONEX) 50 MCG/ACT nasal spray, instill 2 sprays into each nostril once daily    Current Outpatient Medications (Other):  Marland Kitchen  B Complex Vitamins (B COMPLEX PO), Take by mouth. Marland Kitchen  buPROPion (WELLBUTRIN XL) 300 MG 24 hr tablet, Take     1 tablet      every Morning       for Mood, Focus & Concentration .  Cholecalciferol (VITAMIN D) 2000 UNITS tablet, Take 2,000 Units by mouth daily. .  diclofenac Sodium (VOLTAREN) 1 % GEL, Apply 4 g topically 4 (four) times daily. Marland Kitchen  MAGNESIUM PO, Take by mouth daily. .  Wheat Dextrin (BENEFIBER) POWD, Take by mouth 2 (two) times daily.  Current Problems (verified) Patient Active Problem List   Diagnosis Date Noted  . Arthritis of left knee 07/12/2020  . CKD (chronic kidney disease) stage 3, GFR 30-59 ml/min (HCC) 01/11/2020  . Gallstones 01/10/2020  . Aortic dilatation ([place]) 10/13/2019  . Morbid obesity ([place]) 03/17/2014  . Vitamin D deficiency 03/17/2014  . Medication management 03/17/2014  . Mixed hyperlipidemia 03/25/2013  . Anxiety   . Hypothyroid   . Allergy     Screening Tests Immunization History  Administered Date(s) Administered  . Influenza Split 12/08/2013  . Influenza, High Dose Seasonal PF 11/19/2014, 11/01/2016, 12/19/2017, 11/18/2018  . Influenza-Unspecified 12/29/2012, 12/19/2017, 11/09/2019  . PFIZER(Purple Top)SARS-COV-2 Vaccination 04/04/2019, 04/23/2019, 11/09/2019, 06/29/2020  .  Pneumococcal Conjugate-13 09/05/2014  . Pneumococcal Polysaccharide-23 11/02/2015  . Td 03/11/2004  . Tdap 07/08/2012  . Zoster 07/08/2012    Preventative care:  Tetanus: 2014 Pneumonia: 2/2 Influenza: 10/2019 Zoster: 2014 Shingrix: ? Had at [facility] - will check with them  Covid 19: 4/4, last 06/29/2020   Last colonoscopy: 11/2017 Dr. Silverio Decamp, polyps, 5 year recall  EGD: 2009  Last mammogram: 01/2020-  12/2017- had left BENIGN breast biopsy Last pap smear/pelvic exam: 07/2015, DONE DEXA:04/2015 -0.6 normal  CXR 2006 CT head 12/2016  Names of Other Physician/Practitioners you currently use: 1. [facility]- here for primary care 2. Dr. [place], eye doctor, last visit 2021 3. Dr. Orvil Feil, dentist, last visit 2022, q6 months   Patient Care Team: Unk Pinto, MD as PCP - General (Internal Medicine) Sable Feil, MD as Consulting Physician (Gastroenterology) Elsie Saas, MD as Consulting Physician (Orthopedic Surgery) Megan Salon, MD as Consulting Physician (Gynecology)   Allergies No  Known Allergies  SURGICAL HISTORY She  has a past surgical history that includes Laparoscopic gastric banding (2004); Knee arthroscopy (Left); Uterine fibroid surgery (2001); Foot surgery (Right); Tubal ligation; Laparoscopic repair and removal of gastric band; Brachioplasty (03/2016); Cataract extraction (2019); and Breast biopsy (Left, 01/2018). FAMILY HISTORY Her family history includes Cancer in her mother; Diabetes in her father. SOCIAL HISTORY She  reports that she has never smoked. She has never used smokeless tobacco. She reports current alcohol use of about 1.0 - 2.0 standard drink of alcohol per week. She reports that she does not use drugs.  MEDICARE WELLNESS OBJECTIVES: Physical activity: Current Exercise Habits: Home exercise routine, Type of exercise: walking;strength training/weights;Other - see comments, Time (Minutes): 30,  Frequency (Times/Week): 7, Weekly Exercise (Minutes/Week): 210, Intensity: Mild, Exercise limited by: orthopedic condition(s) Cardiac risk factors: Cardiac Risk Factors include: advanced age (>61men, >47 women);dyslipidemia;hypertension;obesity (BMI >30kg/m2) Depression/mood screen:   Depression screen [facility] 2/9 07/12/2020  Decreased Interest 0  Down, Depressed, Hopeless 0  PHQ - 2 Score 0    ADLs:  In your present state of health, do you have any difficulty performing the following activities: 07/12/2020  Hearing? N  Vision? N  Difficulty concentrating or making decisions? N  Walking or climbing stairs? N  Dressing or bathing? N  Doing errands, shopping? N  Some recent data might be hidden     Cognitive Testing  Alert? Yes  Normal Appearance?Yes  Oriented to person? Yes  Place? Yes   Time? Yes  Recall of three objects?  Yes  Can perform simple calculations? Yes  Displays appropriate judgment?Yes  Can read the correct time from a watch face?Yes  EOL planning: Does Patient Have a Medical Advance Directive?: Yes Type of Advance Directive: [place] will Does patient want to make changes to medical advance directive?: No - Patient declined Copy of [place] in Chart?: No - copy requested     Review of Systems:  Review of Systems  Constitutional: Negative.  Negative for malaise/fatigue and weight loss.  HENT: Negative.  Negative for hearing loss and tinnitus.   Eyes: Negative.  Negative for blurred vision and double vision.  Respiratory: Negative.  Negative for cough, sputum production, shortness of breath and wheezing.   Cardiovascular: Negative.  Negative for chest pain, palpitations, orthopnea, claudication, leg swelling and PND.  Gastrointestinal: Negative.  Negative for abdominal pain, blood in stool, constipation, diarrhea, heartburn, melena, nausea and vomiting.  Genitourinary: Negative.   Musculoskeletal: Positive for joint pain  (left knee). Negative for falls and myalgias.  Skin: Negative.  Negative for rash.  Neurological: Negative.  Negative for dizziness, tingling, sensory change, weakness and headaches.  Endo/Heme/Allergies: Negative.  Negative for polydipsia.  Psychiatric/Behavioral: Negative.  Negative for depression, memory loss, substance abuse and suicidal ideas. The patient is not nervous/anxious and does not have insomnia.   All other systems reviewed and are negative.   Objective:   Today's Vitals   07/12/20 0925  BP: 124/84  Pulse: 90  Temp: (!) 96.6 F (35.9 C)  SpO2: 98%  Weight: 215 lb (97.5 kg)    General appearance: alert, no distress, WD/WN,  female HEENT: normocephalic, sclerae anicteric, TMs pearly, nares patent, no discharge or erythema, pharynx normal Oral cavity: MMM, no lesions Neck: supple, no lymphadenopathy, no thyromegaly, no masses Heart: RRR, normal S1, S2, prominent S2 versus late systolic murmur, no radiation to carotids Lungs: CTA bilaterally, no wheezes, rhonchi, or rales Abdomen: +bs, soft, non tender, non  distended, no masses, no hepatomegaly, no splenomegaly Musculoskeletal: nontender, no swelling, than 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  Extremities: no edema, no cyanosis, no clubbing Pulses: 2+ symmetric, upper and lower extremities, normal cap refill Neurological: alert, oriented x 3, CN2-12 intact, strength normal upper extremities and lower extremities, sensation normal throughout, DTRs 2+ throughout, no cerebellar signs, gait antalgic Psychiatric: normal affect, behavior normal, pleasant    Medicare Attestation I have personally reviewed: The patient's medical and social history Their use of alcohol, tobacco or illicit drugs Their current medications and supplements The patient's functional ability including ADLs,fall risks, home safety risks, cognitive, and hearing and visual impairment Diet and physical activities Evidence for depression or mood  disorders  The patient's weight, height, BMI, and visual acuity have been recorded in the chart.  I have made referrals, counseling, and provided education to the patient based on review of the above and I have provided the patient with a written personalized care plan for preventive services.    Izora Ribas, NP 10:17 AM [facility]

## 2020-07-11 DIAGNOSIS — R262 Difficulty in walking, not elsewhere classified: Secondary | ICD-10-CM | POA: Diagnosis not present

## 2020-07-11 DIAGNOSIS — M1712 Unilateral primary osteoarthritis, left knee: Secondary | ICD-10-CM | POA: Diagnosis not present

## 2020-07-11 DIAGNOSIS — M6281 Muscle weakness (generalized): Secondary | ICD-10-CM | POA: Diagnosis not present

## 2020-07-12 ENCOUNTER — Ambulatory Visit: Payer: Medicare PPO | Admitting: Adult Health

## 2020-07-12 ENCOUNTER — Other Ambulatory Visit: Payer: Self-pay

## 2020-07-12 ENCOUNTER — Encounter: Payer: Self-pay | Admitting: Adult Health

## 2020-07-12 VITALS — BP 124/84 | HR 90 | Temp 96.6°F | Wt 215.0 lb

## 2020-07-12 DIAGNOSIS — K8020 Calculus of gallbladder without cholecystitis without obstruction: Secondary | ICD-10-CM

## 2020-07-12 DIAGNOSIS — E782 Mixed hyperlipidemia: Secondary | ICD-10-CM

## 2020-07-12 DIAGNOSIS — Z0001 Encounter for general adult medical examination with abnormal findings: Secondary | ICD-10-CM | POA: Diagnosis not present

## 2020-07-12 DIAGNOSIS — Z79899 Other long term (current) drug therapy: Secondary | ICD-10-CM

## 2020-07-12 DIAGNOSIS — T7840XD Allergy, unspecified, subsequent encounter: Secondary | ICD-10-CM

## 2020-07-12 DIAGNOSIS — R809 Proteinuria, unspecified: Secondary | ICD-10-CM | POA: Diagnosis not present

## 2020-07-12 DIAGNOSIS — E559 Vitamin D deficiency, unspecified: Secondary | ICD-10-CM

## 2020-07-12 DIAGNOSIS — R6889 Other general symptoms and signs: Secondary | ICD-10-CM | POA: Diagnosis not present

## 2020-07-12 DIAGNOSIS — N1831 Chronic kidney disease, stage 3a: Secondary | ICD-10-CM | POA: Diagnosis not present

## 2020-07-12 DIAGNOSIS — F419 Anxiety disorder, unspecified: Secondary | ICD-10-CM | POA: Diagnosis not present

## 2020-07-12 DIAGNOSIS — I77819 Aortic ectasia, unspecified site: Secondary | ICD-10-CM

## 2020-07-12 DIAGNOSIS — M1712 Unilateral primary osteoarthritis, left knee: Secondary | ICD-10-CM | POA: Insufficient documentation

## 2020-07-12 DIAGNOSIS — Z0000 Encounter for general adult medical examination without abnormal findings: Secondary | ICD-10-CM

## 2020-07-12 DIAGNOSIS — E039 Hypothyroidism, unspecified: Secondary | ICD-10-CM | POA: Diagnosis not present

## 2020-07-12 NOTE — Patient Instructions (Signed)
  Kimberly Bowers , Thank you for taking time to come for your Medicare Wellness Visit. I appreciate your ongoing commitment to your health goals. Please review the following plan we discussed and let me know if I can assist you in the future.   These are the goals we discussed: Goals    . DIET - EAT MORE FRUITS AND VEGETABLES     Aim for 7+ 1/2 cup servings daily     . DIET - INCREASE WATER INTAKE     65+ fluid ounces daily; aim to drink a full glass prior to coffee       This is a list of the screening recommended for you and due dates:  Health Maintenance  Topic Date Due  . Flu Shot  10/09/2020  . Mammogram  01/23/2021  . Tetanus Vaccine  07/09/2022  . Colon Cancer Screening  11/28/2022  . DEXA scan (bone density measurement)  Completed  . COVID-19 Vaccine  Completed  .  Hepatitis C: One time screening is recommended by Center for Disease Control  (CDC) for  adults born from [birth year] through 1967.   Completed  . Pneumonia vaccines  Completed  . HPV Vaccine  Aged Out

## 2020-07-13 LAB — COMPLETE METABOLIC PANEL WITH GFR
AG Ratio: 2 (calc) (ref 1.0–2.5)
ALT: 19 U/L (ref 6–29)
AST: 19 U/L (ref 10–35)
Albumin: 4.6 g/dL (ref 3.6–5.1)
Alkaline phosphatase (APISO): 56 U/L (ref 37–153)
BUN: 21 mg/dL (ref 7–25)
CO2: 25 mmol/L (ref 20–32)
Calcium: 9.6 mg/dL (ref 8.6–10.4)
Chloride: 103 mmol/L (ref 98–110)
Creat: 0.91 mg/dL (ref 0.60–0.93)
GFR, Est African American: 74 mL/min/{1.73_m2} (ref >=60)
GFR, Est Non African American: 63 mL/min/{1.73_m2} (ref >=60)
Globulin: 2.3 g/dL (calc) (ref 1.9–3.7)
Glucose, Bld: 91 mg/dL (ref 65–99)
Potassium: 4.5 mmol/L (ref 3.5–5.3)
Sodium: 138 mmol/L (ref 135–146)
Total Bilirubin: 0.6 mg/dL (ref 0.2–1.2)
Total Protein: 6.9 g/dL (ref 6.1–8.1)

## 2020-07-13 LAB — MICROSCOPIC MESSAGE

## 2020-07-13 LAB — CBC WITH DIFFERENTIAL/PLATELET
Absolute Monocytes: 351 cells/uL (ref 200–950)
Basophils Absolute: 38 cells/uL (ref 0–200)
Basophils Relative: 0.7 %
Eosinophils Absolute: 140 cells/uL (ref 15–500)
Eosinophils Relative: 2.6 %
HCT: 44.9 % (ref 35.0–45.0)
Hemoglobin: 15 g/dL (ref 11.7–15.5)
Lymphs Abs: 967 cells/uL (ref 850–3900)
MCH: 32.4 pg (ref 27.0–33.0)
MCHC: 33.4 g/dL (ref 32.0–36.0)
MCV: 97 fL (ref 80.0–100.0)
MPV: 9.6 fL (ref 7.5–12.5)
Monocytes Relative: 6.5 %
Neutro Abs: 3904 cells/uL (ref 1500–7800)
Neutrophils Relative %: 72.3 %
Platelets: 318 10*3/uL (ref 140–400)
RBC: 4.63 10*6/uL (ref 3.80–5.10)
RDW: 12.2 % (ref 11.0–15.0)
Total Lymphocyte: 17.9 %
WBC: 5.4 10*3/uL (ref 3.8–10.8)

## 2020-07-13 LAB — URINALYSIS, ROUTINE W REFLEX MICROSCOPIC
Bacteria, UA: NONE SEEN /HPF
Bilirubin Urine: NEGATIVE
Glucose, UA: NEGATIVE
Hgb urine dipstick: NEGATIVE
Hyaline Cast: NONE SEEN /LPF
Nitrite: NEGATIVE
Protein, ur: NEGATIVE
Specific Gravity, Urine: 1.025 (ref 1.001–1.035)
pH: <=5 (ref 5.0–8.0)

## 2020-07-13 LAB — LIPID PANEL
Cholesterol: 205 mg/dL — ABNORMAL HIGH (ref <200)
HDL: 78 mg/dL (ref >=50)
LDL Cholesterol (Calc): 109 mg/dL (calc) — ABNORMAL HIGH
Non-HDL Cholesterol (Calc): 127 mg/dL (calc) (ref <130)
Total CHOL/HDL Ratio: 2.6 (calc) (ref <5.0)
Triglycerides: 85 mg/dL (ref <150)

## 2020-07-13 LAB — MAGNESIUM: Magnesium: 2.5 mg/dL (ref 1.5–2.5)

## 2020-07-13 LAB — VITAMIN D 25 HYDROXY (VIT D DEFICIENCY, FRACTURES): Vit D, 25-Hydroxy: 49 ng/mL (ref 30–100)

## 2020-07-13 LAB — TSH: TSH: 0.94 mIU/L (ref 0.40–4.50)

## 2020-07-18 DIAGNOSIS — M1712 Unilateral primary osteoarthritis, left knee: Secondary | ICD-10-CM | POA: Diagnosis not present

## 2020-07-18 DIAGNOSIS — R262 Difficulty in walking, not elsewhere classified: Secondary | ICD-10-CM | POA: Diagnosis not present

## 2020-07-18 DIAGNOSIS — M6281 Muscle weakness (generalized): Secondary | ICD-10-CM | POA: Diagnosis not present

## 2020-11-11 ENCOUNTER — Other Ambulatory Visit: Payer: Self-pay | Admitting: Adult Health

## 2020-11-11 ENCOUNTER — Other Ambulatory Visit: Payer: Self-pay | Admitting: Internal Medicine

## 2020-11-11 DIAGNOSIS — M25562 Pain in left knee: Secondary | ICD-10-CM

## 2020-11-11 DIAGNOSIS — M170 Bilateral primary osteoarthritis of knee: Secondary | ICD-10-CM

## 2020-11-11 MED ORDER — DICLOFENAC SODIUM 75 MG PO TBEC: 75.0000 mg | DELAYED_RELEASE_TABLET | Freq: Two times a day (BID) | ORAL | 0 refills | Status: DC

## 2020-11-23 NOTE — Progress Notes (Signed)
CPE AND FOLLOW UP  Assessment:   Encounter for Annual Physical Exam with abnormal findings Due annually  Health Maintenance reviewed Healthy lifestyle reviewed and goals set  Aortic dilatation ([place]) See on Echo 2021  Control BP  ECHO follow up due this year, discussed and order placed  Morbid obesity ([place]) - BMI 35 + hld, knee arthritis Lack of benefit with wellbutrin, naltrexone, phentermine Discussed diet, will restart intermittent fasting, will increase fiber and protein. High fiber diet handout given, goals set for exercise Close follow up 3-4 months  Mixed hyperlipidemia -     Lipid panel check lipids decrease fatty foods increase activity.   CKD IIIa (HCC) Increase fluids, avoid NSAIDS, monitor sugars, will monitor - CMP/GFR, UA, microalbumin  Medication management -     CBC with Differential/Platelet -     COMPLETE METABOLIC PANEL WITH GFR -     Magnesium  Vitamin D deficiency -     VITAMIN D 25 Hydroxy (Vit-D Deficiency, Fractures)  Hypothyroidism, unspecified type -     TSH Hypothyroidism-check TSH level, continue medications the same, reminded to take on an empty stomach 30-42mns before food.  Feeling better on the 125 mcg- will recheck  Allergic state, subsequent encounter Continue to monitor  Anxiety Monitor  Systolic murmur No symptoms recently Sister with history of mitral prolapse Repeat ECHO due to aortic dilation  Orders Placed This Encounter  Procedures   CBC with Differential/Platelet   COMPLETE METABOLIC PANEL WITH GFR   Magnesium   Lipid panel   TSH   Hemoglobin A1c   Microalbumin / creatinine urine ratio   Urinalysis, Routine w reflex microscopic   Insulin, random   Vitamin B12   EKG 12-Lead   ECHOCARDIOGRAM COMPLETE      Future Appointments  Date Time Provider [place] 03/27/2021 11:00 AM CLiane Comber NP [facility] None  07/12/2021  9:30 AM CLiane Comber NP [facility] None  11/27/2021  9:00 AM CLiane Comber NP [facility] None     Subjective:   AAzeriah Doellingis a 70y.o. female who presents for CPE and 3 month follow up on hypertension, hypothyroidism, obesity, hyperlipidemia, vitamin D def. She has Anxiety; Hypothyroid; Allergy; Mixed hyperlipidemia; Morbid [place] ([place]; Vitamin D deficiency; Medication management; Aortic dilatation (HCC); Gallstones; CKD (chronic kidney disease) stage 3, GFR 30-59 ml/min (HCC); and Arthritis of left knee on their problem list.  She is married, 2 grown sons, 2 grandchildren. Retired dScientist, research (medical)   Left knee arthritis, intermittent back pain, follows with [facility] - Dr. BLayne Benton  has cortisone shots and doing strengthening exercises.   BMI is Body mass index is 35.54 kg/m., she is working on diet and exercise, walking, doing free weights and recumbent bike.  She started on wellbutrin and naltrexone, hasn't noted benefit and stopped, Wellbutrin caused some sleep problems. Didn't tolerate phentermine remotely.  Following mediterranean diet.  Wt Readings from Last 3 Encounters:  11/24/20 213 lb 9.6 oz (96.9 kg)  07/12/20 215 lb (97.5 kg)  02/10/20 214 lb 12.8 oz (97.4 kg)   She had ECHO 09/2019 with EF 50000000 grade 1 dystolic dysfunction, trivial MVR, did show mild ascending aortic dilation of 38 mm, planning follow up ECHO this year.   Her blood pressure has been controlled at home, today their BP is BP: 128/88 She does workout, she is walking daily, doing free weights.  She denies chest pain, shortness of breath, dizziness.   She is not on cholesterol medication and denies myalgias. Her cholesterol  is not at goal. The cholesterol last visit was:   Lab Results  Component Value Date   CHOL 205 (H) 07/12/2020   HDL 78 07/12/2020   LDLCALC 109 (H) 07/12/2020   TRIG 85 07/12/2020   CHOLHDL 2.6 07/12/2020   Last A1C in the office was:  Lab Results  Component Value Date   HGBA1C 4.9 11/18/2018   She has CKD II/III, stable, monitored at  this office; normal renal ultrasound in 11/2019, did incidentally show gallstones, has stopped diclofenac oral, takes rarely PRN Lab Results  Component Value Date   GFRNONAA 63 07/12/2020   GFRNONAA 59 (L) 02/10/2020   GFRNONAA 59 (L) 01/10/2020   Lab Results  Component Value Date   MICROALBUR 2.9 11/25/2019   MICROALBUR 2.2 11/18/2018   Patient is on Vitamin D supplement, 2000 IU daily.  Lab Results  Component Value Date   VD25OH 49 07/12/2020     She is on thyroid medication. Her medication was not changed last visit. She is on 112mg every day except 1/2 tab on Sunday..  She is not on a biotin.  Lab Results  Component Value Date   TSH 0.94 07/12/2020      Medication Review  Current Outpatient Medications (Endocrine & Metabolic):    levothyroxine (SYNTHROID) 125 MCG tablet, TAKE 1 TABLET EXCEPT 1/2 TAB ON Sunday BY MOUTH DAILY ON AN EMPTY STOMACH WITH ONLY WATER FOR 30 MINUTES & NO ANTACID MEDS, CALCIUM OR MAGNESIUM FOR 4 HOURS & AVOID BIOTIN   Current Outpatient Medications (Respiratory):    fexofenadine (ALLEGRA) 60 MG tablet, Take 60 mg by mouth daily as needed for allergies or rhinitis.   mometasone (NASONEX) 50 MCG/ACT nasal spray, instill 2 sprays into each nostril once daily  Current Outpatient Medications (Analgesics):    diclofenac (VOLTAREN) 75 MG EC tablet, Take 1 tablet (75 mg total) by mouth 2 (two) times daily with a meal. As needed for back pain flare.   Current Outpatient Medications (Other):    B Complex Vitamins (B COMPLEX PO), Take by mouth.   Cholecalciferol (VITAMIN D) 2000 UNITS tablet, Take 2,000 Units by mouth daily.   diclofenac Sodium (VOLTAREN) 1 % GEL, Apply 4 g topically 4 (four) times daily.   MAGNESIUM PO, Take by mouth daily.   Wheat Dextrin (BENEFIBER) POWD, Take by mouth 2 (two) times daily.   buPROPion (WELLBUTRIN XL) 300 MG 24 hr tablet, Take     1 tablet      every Morning       for Mood, Focus & Concentration (Patient not taking:  Reported on 11/24/2020)  Current Problems (verified) Patient Active Problem List   Diagnosis Date Noted   Arthritis of left knee 07/12/2020   CKD (chronic kidney disease) stage 3, GFR 30-59 ml/min (HCC) 01/11/2020   Gallstones 01/10/2020   Aortic dilatation ([place] 10/13/2019   Morbid obesity ([place] 03/17/2014   Vitamin D deficiency 03/17/2014   Medication management 03/17/2014   Mixed hyperlipidemia 03/25/2013   Anxiety    Hypothyroid    Allergy     Screening Tests Immunization History  Administered Date(s) Administered   Influenza Split 12/08/2013   Influenza, High Dose Seasonal PF 11/19/2014, 11/01/2016, 12/19/2017, 11/18/2018   Influenza-Unspecified 12/29/2012, 12/19/2017, 11/09/2019   PFIZER(Purple Top)SARS-COV-2 Vaccination 04/04/2019, 04/23/2019, 11/09/2019, 06/29/2020, 11/14/2020   Pneumococcal Conjugate-13 09/05/2014   Pneumococcal Polysaccharide-23 11/02/2015   Td 03/11/2004   Tdap 07/08/2012   Zoster, Live 07/08/2012    Preventative care:  Tetanus: 2014 Pneumonia: 2/2 Influenza:  10/2019 Encouraged in Oct  Zoster: 2014 [facility]: [facility] - dates requested Covid 19: 4/4, last 06/29/2020  Last colonoscopy: 11/2017 Dr. Silverio Decamp, polyps, 5 year recall  EGD: 2009  Last mammogram: 01/2020- has upcoming  12/2017- had left BENIGN breast biopsy  Last pap smear/pelvic exam: 07/2015, DONE DEXA:04/2015 -0.6 normal  Names of Other Physician/Practitioners you currently use: 1. [facility]- here for primary care 2. Dr. [place], eye doctor, last visit 2021, has scheduled next week  3. Dr. Orvil Feil, dentist, last visit 2022, q6 months 4. Dr. ? - Dr. [facility], derm, has upcoming scheduled 12/2020, goes annually   Patient Care Team: Unk Pinto, MD as PCP - General (Internal Medicine) Sable Feil, MD as Consulting Physician (Gastroenterology) Elsie Saas, MD as Consulting Physician (Orthopedic Surgery) Megan Salon, MD as Consulting Physician (Gynecology)   Allergies No Known Allergies  SURGICAL HISTORY She  has a past surgical history that includes Laparoscopic gastric banding (2004); Knee arthroscopy (Left); Uterine fibroid surgery (2001); Foot surgery (Right); Tubal ligation; Laparoscopic repair and removal of gastric band; Brachioplasty (03/2016); Cataract extraction (Bilateral, 2019); and Breast biopsy (Left, 01/2018). FAMILY HISTORY Her family history includes Cancer in her mother; Diabetes in her father; Emphysema in her father; Melanoma in her brother. SOCIAL HISTORY She  reports that she has never smoked. She has never used smokeless tobacco. She reports current alcohol use of about 1.0 - 2.0 standard drink per week. She reports that she does not use drugs.   Review of Systems:  Review of Systems  Constitutional: Negative.  Negative for malaise/fatigue and weight loss.  HENT: Negative.  Negative for hearing loss and tinnitus.   Eyes: Negative.  Negative for blurred vision and double vision.  Respiratory: Negative.  Negative for cough, sputum production, shortness of breath and wheezing.   Cardiovascular: Negative.  Negative for chest pain, palpitations, orthopnea, claudication, leg swelling and PND.  Gastrointestinal: Negative.  Negative for abdominal pain, blood in stool, constipation, diarrhea, heartburn, melena, nausea and vomiting.  Genitourinary: Negative.   Musculoskeletal:  Positive for joint pain (left knee). Negative for falls and myalgias.  Skin: Negative.  Negative for rash.  Neurological: Negative.  Negative for dizziness, tingling, sensory change, weakness and headaches.  Endo/Heme/Allergies: Negative.  Negative for polydipsia.  Psychiatric/Behavioral: Negative.  Negative for depression, memory loss, substance abuse and suicidal ideas. The patient is not nervous/anxious and does not have insomnia.   All other systems reviewed and are negative.  Objective:   Today's  Vitals   11/24/20 0858  BP: 128/88  Pulse: 91  Resp: 16  Temp: 97.6 F (36.4 C)  SpO2: 98%  Weight: 213 lb 9.6 oz (96.9 kg)  Height: '5\' 5"'$  (1.651 m)    General appearance: alert, no distress, WD/WN,  female HEENT: normocephalic, sclerae anicteric, TMs pearly, nares patent, no discharge or erythema, pharynx normal Oral cavity: MMM, no lesions Neck: supple, no lymphadenopathy, no thyromegaly, no masses Heart: RRR, normal S1, S2, no radiation to carotids Lungs: CTA bilaterally, no wheezes, rhonchi, or rales Abdomen: +bs, soft, non tender, non distended, no masses, no hepatomegaly, no splenomegaly Musculoskeletal: nontender, no swelling, than 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  Extremities: no edema, no cyanosis, no clubbing Pulses: 2+ symmetric, upper and lower extremities, normal cap refill Neurological: alert, oriented x 3, CN2-12 intact, strength normal upper extremities and lower extremities, sensation normal throughout, DTRs 2+ throughout, no cerebellar signs, gait antalgic Psychiatric: normal affect, behavior normal, pleasant  Breasts: getting annual mammograms, denies concerns GU: defer  EKG: NSR, NSCPT   Izora Ribas, NP 12:13 PM [facility]

## 2020-11-24 ENCOUNTER — Ambulatory Visit (INDEPENDENT_AMBULATORY_CARE_PROVIDER_SITE_OTHER): Payer: Medicare PPO | Admitting: Adult Health

## 2020-11-24 ENCOUNTER — Other Ambulatory Visit: Payer: Self-pay

## 2020-11-24 ENCOUNTER — Encounter: Payer: Self-pay | Admitting: Adult Health

## 2020-11-24 VITALS — BP 128/88 | HR 91 | Temp 97.6°F | Resp 16 | Ht 65.0 in | Wt 213.6 lb

## 2020-11-24 DIAGNOSIS — N1831 Chronic kidney disease, stage 3a: Secondary | ICD-10-CM

## 2020-11-24 DIAGNOSIS — Z131 Encounter for screening for diabetes mellitus: Secondary | ICD-10-CM | POA: Diagnosis not present

## 2020-11-24 DIAGNOSIS — T7840XD Allergy, unspecified, subsequent encounter: Secondary | ICD-10-CM

## 2020-11-24 DIAGNOSIS — E782 Mixed hyperlipidemia: Secondary | ICD-10-CM | POA: Diagnosis not present

## 2020-11-24 DIAGNOSIS — E039 Hypothyroidism, unspecified: Secondary | ICD-10-CM | POA: Diagnosis not present

## 2020-11-24 DIAGNOSIS — F419 Anxiety disorder, unspecified: Secondary | ICD-10-CM

## 2020-11-24 DIAGNOSIS — Z136 Encounter for screening for cardiovascular disorders: Secondary | ICD-10-CM | POA: Diagnosis not present

## 2020-11-24 DIAGNOSIS — Z0000 Encounter for general adult medical examination without abnormal findings: Secondary | ICD-10-CM | POA: Diagnosis not present

## 2020-11-24 DIAGNOSIS — I10 Essential (primary) hypertension: Secondary | ICD-10-CM | POA: Diagnosis not present

## 2020-11-24 DIAGNOSIS — I77819 Aortic ectasia, unspecified site: Secondary | ICD-10-CM

## 2020-11-24 DIAGNOSIS — Z0001 Encounter for general adult medical examination with abnormal findings: Secondary | ICD-10-CM | POA: Diagnosis not present

## 2020-11-24 DIAGNOSIS — E559 Vitamin D deficiency, unspecified: Secondary | ICD-10-CM

## 2020-11-24 DIAGNOSIS — M1712 Unilateral primary osteoarthritis, left knee: Secondary | ICD-10-CM

## 2020-11-24 DIAGNOSIS — Z1389 Encounter for screening for other disorder: Secondary | ICD-10-CM

## 2020-11-24 DIAGNOSIS — Z79899 Other long term (current) drug therapy: Secondary | ICD-10-CM | POA: Diagnosis not present

## 2020-11-24 DIAGNOSIS — E538 Deficiency of other specified B group vitamins: Secondary | ICD-10-CM | POA: Diagnosis not present

## 2020-11-24 DIAGNOSIS — K8020 Calculus of gallbladder without cholecystitis without obstruction: Secondary | ICD-10-CM

## 2020-11-24 DIAGNOSIS — R7309 Other abnormal glucose: Secondary | ICD-10-CM

## 2020-11-24 MED ORDER — LEVOTHYROXINE SODIUM 125 MCG PO TABS: ORAL_TABLET | ORAL | 1 refills | Status: DC

## 2020-11-24 NOTE — Patient Instructions (Addendum)
Kimberly Bowers , Thank you for taking time to come for your Annual Wellness Visit. I appreciate your ongoing commitment to your health goals. Please review the following plan we discussed and let me know if I can assist you in the future.   These are the goals we discussed:  Goals      DIET - EAT MORE FRUITS AND VEGETABLES     Aim for 7+ 1/2 cup servings daily   Daily beans, nuts, seeds, greens, cruciferous, berries/cherries Choose fresh or frozen, unprocessed options     DIET - INCREASE WATER INTAKE     65+ fluid ounces daily; aim to drink a full glass prior to coffee        This is a list of the screening recommended for you and due dates:  Health Maintenance  Topic Date Due   Zoster (Shingles) Vaccine (1 of 2) Never done   Urine Protein Check  11/24/2020   Flu Shot  12/09/2020*   Mammogram  01/23/2021   Tetanus Vaccine  07/09/2022   Colon Cancer Screening  11/28/2022   DEXA scan (bone density measurement)  Completed   COVID-19 Vaccine  Completed   Hepatitis C Screening: USPSTF Recommendation to screen - Ages 21-79 yo.  Completed   Pneumonia vaccines  Completed   HPV Vaccine  Aged Out  *Topic was postponed. The date shown is not the original due date.      [facility]     Know what a healthy weight is for you and aim to maintain this  Aim for 7+ servings of fruits and vegetables daily  65-80+ fluid ounces of water or unsweet tea for healthy kidneys  Limit to max 1 drink of alcohol per day; avoid smoking/tobacco  Limit animal fats in diet for cholesterol and heart health - choose grass fed whenever available  Avoid highly processed foods, and foods high in saturated/trans fats  Aim for low stress - take time to unwind and care for your mental health  Aim for 150 min of moderate intensity exercise weekly for heart health, and weights twice weekly for bone health  Aim for 7-9 hours of sleep daily    High-Fiber Eating Plan Fiber, also  called dietary fiber, is a type of carbohydrate. It is found foods such as fruits, vegetables, whole grains, and beans. A high-fiber diet can have many health benefits. Your health care provider may recommend a high-fiber diet to help: Prevent constipation. Fiber can make your bowel movements more regular. Lower your cholesterol. Relieve the following conditions: Inflammation of veins in the anus (hemorrhoids). Inflammation of specific areas of the digestive tract (uncomplicated diverticulosis). A problem of the large intestine, also called the colon, that sometimes causes pain and diarrhea (irritable bowel syndrome, or IBS). Prevent overeating as part of a weight-loss plan. Prevent heart disease, type 2 diabetes, and certain cancers. What are tips for following this plan? Reading food labels  Check the nutrition facts label on food products for the amount of dietary fiber. Choose foods that have 5 grams of fiber or more per serving. The goals for recommended daily fiber intake include: Men (age 36 or younger): 34-38 g. Men (over age 28): 28-34 g. Women (age 83 or younger): 25-28 g. Women (over age 46): 22-25 g. Your daily fiber goal is _____________ g. Shopping Choose whole fruits and vegetables instead of processed forms, such as apple juice or applesauce. Choose a wide variety of high-fiber foods such as avocados, lentils,  oats, and kidney beans. Read the nutrition facts label of the foods you choose. Be aware of foods with added fiber. These foods often have high sugar and sodium amounts per serving. Cooking Use whole-grain flour for baking and cooking. Cook with brown rice instead of white rice. Meal planning Start the day with a breakfast that is high in fiber, such as a cereal that contains 5 g of fiber or more per serving. Eat breads and cereals that are made with whole-grain flour instead of refined flour or white flour. Eat brown rice, bulgur wheat, or millet instead of white  rice. Use beans in place of meat in soups, salads, and pasta dishes. Be sure that half of the grains you eat each day are whole grains. General information You can get the recommended daily intake of dietary fiber by: Eating a variety of fruits, vegetables, grains, nuts, and beans. Taking a fiber supplement if you are not able to take in enough fiber in your diet. It is better to get fiber through food than from a supplement. Gradually increase how much fiber you consume. If you increase your intake of dietary fiber too quickly, you may have bloating, cramping, or gas. Drink plenty of water to help you digest fiber. Choose high-fiber snacks, such as berries, raw vegetables, nuts, and popcorn. What foods should I eat? Fruits Berries. Pears. Apples. Oranges. Avocado. Prunes and raisins. Dried figs. Vegetables Sweet potatoes. Spinach. Kale. Artichokes. Cabbage. Broccoli. Cauliflower. Green peas. Carrots. Squash. Grains Whole-grain breads. Multigrain cereal. Oats and oatmeal. Brown rice. Barley. Bulgur wheat. [place]. Quinoa. Bran muffins. Popcorn. Rye wafer crackers. Meats and other proteins Navy beans, kidney beans, and pinto beans. Soybeans. Split peas. Lentils. Nuts and seeds. Dairy Fiber-fortified yogurt. Beverages Fiber-fortified soy milk. Fiber-fortified orange juice. Other foods Fiber bars. The items listed above may not be a complete list of recommended foods and beverages. Contact a dietitian for more information. What foods should I avoid? Fruits Fruit juice. Cooked, strained fruit. Vegetables Fried potatoes. Canned vegetables. Well-cooked vegetables. Grains White bread. Pasta made with refined flour. White rice. Meats and other proteins Fatty cuts of meat. Fried chicken or fried fish. Dairy Milk. Yogurt. Cream cheese. Sour cream. Fats and oils Butters. Beverages Soft drinks. Other foods Cakes and pastries. The items listed above may not be a complete list of foods  and beverages to avoid. Talk with your dietitian about what choices are best for you. Summary Fiber is a type of carbohydrate. It is found in foods such as fruits, vegetables, whole grains, and beans. A high-fiber diet has many benefits. It can help to prevent constipation, lower blood cholesterol, aid weight loss, and reduce your risk of heart disease, diabetes, and certain cancers. Increase your intake of fiber gradually. Increasing fiber too quickly may cause cramping, bloating, and gas. Drink plenty of water while you increase the amount of fiber you consume. The best sources of fiber include whole fruits and vegetables, whole grains, nuts, seeds, and beans. This information is not intended to replace advice given to you by your health care provider. Make sure you discuss any questions you have with your health care provider. Document Revised: 07/01/2019 Document Reviewed: 07/01/2019 Elsevier Patient Education  2022 [employer].

## 2020-11-25 ENCOUNTER — Other Ambulatory Visit: Payer: Self-pay | Admitting: Adult Health

## 2020-11-25 ENCOUNTER — Encounter: Payer: Self-pay | Admitting: Adult Health

## 2020-11-25 DIAGNOSIS — R809 Proteinuria, unspecified: Secondary | ICD-10-CM | POA: Insufficient documentation

## 2020-11-25 MED ORDER — SEMAGLUTIDE(0.25 OR 0.5MG/DOS) 2 MG/1.5ML ~~LOC~~ SOPN: PEN_INJECTOR | SUBCUTANEOUS | 0 refills | Status: DC

## 2020-11-27 LAB — COMPLETE METABOLIC PANEL WITH GFR
AG Ratio: 2.1 (calc) (ref 1.0–2.5)
ALT: 16 U/L (ref 6–29)
AST: 23 U/L (ref 10–35)
Albumin: 4.6 g/dL (ref 3.6–5.1)
Alkaline phosphatase (APISO): 58 U/L (ref 37–153)
BUN: 19 mg/dL (ref 7–25)
CO2: 28 mmol/L (ref 20–32)
Calcium: 9.4 mg/dL (ref 8.6–10.4)
Chloride: 102 mmol/L (ref 98–110)
Creat: 0.93 mg/dL (ref 0.60–1.00)
Globulin: 2.2 g/dL (calc) (ref 1.9–3.7)
Glucose, Bld: 86 mg/dL (ref 65–99)
Potassium: 4.2 mmol/L (ref 3.5–5.3)
Sodium: 138 mmol/L (ref 135–146)
Total Bilirubin: 0.8 mg/dL (ref 0.2–1.2)
Total Protein: 6.8 g/dL (ref 6.1–8.1)
eGFR: 65 mL/min/{1.73_m2} (ref >=60)

## 2020-11-27 LAB — MAGNESIUM: Magnesium: 2.4 mg/dL (ref 1.5–2.5)

## 2020-11-27 LAB — TSH: TSH: 2.14 mIU/L (ref 0.40–4.50)

## 2020-11-27 LAB — CBC WITH DIFFERENTIAL/PLATELET
Absolute Monocytes: 340 cells/uL (ref 200–950)
Basophils Absolute: 42 cells/uL (ref 0–200)
Basophils Relative: 1 %
Eosinophils Absolute: 130 cells/uL (ref 15–500)
Eosinophils Relative: 3.1 %
HCT: 45.6 % — ABNORMAL HIGH (ref 35.0–45.0)
Hemoglobin: 14.9 g/dL (ref 11.7–15.5)
Lymphs Abs: 1092 cells/uL (ref 850–3900)
MCH: 32.3 pg (ref 27.0–33.0)
MCHC: 32.7 g/dL (ref 32.0–36.0)
MCV: 98.7 fL (ref 80.0–100.0)
MPV: 9.7 fL (ref 7.5–12.5)
Monocytes Relative: 8.1 %
Neutro Abs: 2596 cells/uL (ref 1500–7800)
Neutrophils Relative %: 61.8 %
Platelets: 285 10*3/uL (ref 140–400)
RBC: 4.62 10*6/uL (ref 3.80–5.10)
RDW: 12.1 % (ref 11.0–15.0)
Total Lymphocyte: 26 %
WBC: 4.2 10*3/uL (ref 3.8–10.8)

## 2020-11-27 LAB — URINALYSIS, ROUTINE W REFLEX MICROSCOPIC
Bilirubin Urine: NEGATIVE
Glucose, UA: NEGATIVE
Hgb urine dipstick: NEGATIVE
Nitrite: NEGATIVE
Specific Gravity, Urine: 1.034 (ref 1.001–1.035)
pH: <=5 (ref 5.0–8.0)

## 2020-11-27 LAB — LIPID PANEL
Cholesterol: 211 mg/dL — ABNORMAL HIGH (ref <200)
HDL: 70 mg/dL (ref >=50)
LDL Cholesterol (Calc): 119 mg/dL (calc) — ABNORMAL HIGH
Non-HDL Cholesterol (Calc): 141 mg/dL (calc) — ABNORMAL HIGH (ref <130)
Total CHOL/HDL Ratio: 3 (calc) (ref <5.0)
Triglycerides: 109 mg/dL (ref <150)

## 2020-11-27 LAB — MICROALBUMIN / CREATININE URINE RATIO
Creatinine, Urine: 290 mg/dL — ABNORMAL HIGH (ref 20–275)
Microalb Creat Ratio: 6 mcg/mg creat (ref <30)
Microalb, Ur: 1.6 mg/dL

## 2020-11-27 LAB — MICROSCOPIC MESSAGE

## 2020-11-27 LAB — VITAMIN B12: Vitamin B-12: 524 pg/mL (ref 200–1100)

## 2020-11-27 LAB — HEMOGLOBIN A1C
Hgb A1c MFr Bld: 4.8 % of total Hgb (ref <5.7)
Mean Plasma Glucose: 91 mg/dL
eAG (mmol/L): 5 mmol/L

## 2020-11-27 LAB — INSULIN, RANDOM: Insulin: 4.7 u[IU]/mL

## 2020-12-05 ENCOUNTER — Other Ambulatory Visit: Payer: Self-pay

## 2020-12-05 ENCOUNTER — Ambulatory Visit (INDEPENDENT_AMBULATORY_CARE_PROVIDER_SITE_OTHER): Payer: Medicare PPO

## 2020-12-05 DIAGNOSIS — Z79899 Other long term (current) drug therapy: Secondary | ICD-10-CM

## 2020-12-05 NOTE — Progress Notes (Signed)
Patient presents to the office for a nurse visit for instruction on giving herself an Ozempic injection. Instructions given and patient administered without any complications.

## 2020-12-11 ENCOUNTER — Encounter: Payer: Self-pay | Admitting: Internal Medicine

## 2020-12-12 DIAGNOSIS — Z961 Presence of intraocular lens: Secondary | ICD-10-CM | POA: Diagnosis not present

## 2020-12-12 DIAGNOSIS — H353131 Nonexudative age-related macular degeneration, bilateral, early dry stage: Secondary | ICD-10-CM | POA: Diagnosis not present

## 2020-12-12 DIAGNOSIS — H1789 Other corneal scars and opacities: Secondary | ICD-10-CM | POA: Diagnosis not present

## 2020-12-12 DIAGNOSIS — H43812 Vitreous degeneration, left eye: Secondary | ICD-10-CM | POA: Diagnosis not present

## 2020-12-12 DIAGNOSIS — H16223 Keratoconjunctivitis sicca, not specified as Sjogren's, bilateral: Secondary | ICD-10-CM | POA: Diagnosis not present

## 2020-12-12 DIAGNOSIS — Z9849 Cataract extraction status, unspecified eye: Secondary | ICD-10-CM | POA: Diagnosis not present

## 2020-12-26 ENCOUNTER — Other Ambulatory Visit: Payer: Self-pay

## 2020-12-26 ENCOUNTER — Ambulatory Visit (HOSPITAL_COMMUNITY): Payer: Medicare PPO | Attending: Cardiovascular Disease

## 2020-12-26 DIAGNOSIS — I77819 Aortic ectasia, unspecified site: Secondary | ICD-10-CM | POA: Diagnosis not present

## 2020-12-26 DIAGNOSIS — R011 Cardiac murmur, unspecified: Secondary | ICD-10-CM

## 2020-12-26 DIAGNOSIS — H04123 Dry eye syndrome of bilateral lacrimal glands: Secondary | ICD-10-CM | POA: Diagnosis not present

## 2020-12-26 DIAGNOSIS — I10 Essential (primary) hypertension: Secondary | ICD-10-CM | POA: Diagnosis not present

## 2020-12-26 DIAGNOSIS — E785 Hyperlipidemia, unspecified: Secondary | ICD-10-CM | POA: Insufficient documentation

## 2020-12-26 DIAGNOSIS — I77810 Thoracic aortic ectasia: Secondary | ICD-10-CM | POA: Diagnosis not present

## 2020-12-26 DIAGNOSIS — I517 Cardiomegaly: Secondary | ICD-10-CM | POA: Diagnosis not present

## 2020-12-26 LAB — ECHOCARDIOGRAM COMPLETE
Area-P 1/2: 2.07 cm2
S' Lateral: 3.1 cm

## 2020-12-27 ENCOUNTER — Other Ambulatory Visit: Payer: Self-pay | Admitting: Adult Health

## 2020-12-27 DIAGNOSIS — I77810 Thoracic aortic ectasia: Secondary | ICD-10-CM

## 2021-01-05 DIAGNOSIS — L578 Other skin changes due to chronic exposure to nonionizing radiation: Secondary | ICD-10-CM | POA: Diagnosis not present

## 2021-01-05 DIAGNOSIS — Z23 Encounter for immunization: Secondary | ICD-10-CM | POA: Diagnosis not present

## 2021-01-05 DIAGNOSIS — D225 Melanocytic nevi of trunk: Secondary | ICD-10-CM | POA: Diagnosis not present

## 2021-01-05 DIAGNOSIS — Z411 Encounter for cosmetic surgery: Secondary | ICD-10-CM | POA: Diagnosis not present

## 2021-01-05 DIAGNOSIS — D2230 Melanocytic nevi of unspecified part of face: Secondary | ICD-10-CM | POA: Diagnosis not present

## 2021-01-05 DIAGNOSIS — D2272 Melanocytic nevi of left lower limb, including hip: Secondary | ICD-10-CM | POA: Diagnosis not present

## 2021-01-05 DIAGNOSIS — Z85828 Personal history of other malignant neoplasm of skin: Secondary | ICD-10-CM | POA: Diagnosis not present

## 2021-01-05 DIAGNOSIS — L720 Epidermal cyst: Secondary | ICD-10-CM | POA: Diagnosis not present

## 2021-01-16 ENCOUNTER — Institutional Professional Consult (permissible substitution): Payer: Medicare PPO | Admitting: Physician Assistant

## 2021-01-16 ENCOUNTER — Other Ambulatory Visit: Payer: Self-pay

## 2021-01-16 VITALS — BP 147/100 | HR 100 | Resp 20 | Ht 65.0 in | Wt 210.0 lb

## 2021-01-16 DIAGNOSIS — I7121 Aneurysm of the ascending aorta, without rupture: Secondary | ICD-10-CM

## 2021-01-16 DIAGNOSIS — I082 Rheumatic disorders of both aortic and tricuspid valves: Secondary | ICD-10-CM

## 2021-01-16 NOTE — Progress Notes (Addendum)
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  Patient denies chest pain, pressure, or tightness, or shortness of breath. She had an echo done October 05, 2019 because of a work up for palpations she was having. Also, at that time, she was having issues with her thyroid. She no longer has palpitations and her thyroid function has been adjusted with medication. She had a follow up echo done October 18,2022 for the ascending thoracic dilatation seen in 2021 and that showed mild dilatation of the ascending aorta, measuring 41 mm. Patient denies chest pain, pressure or tightness or shortness of breath.  Past Medical History:  Diagnosis Date   Allergy    Anxiety    Arthritis    Diverticulitis 2004   Hypothyroid    Neutropenia [facility])     Past Surgical History:  Procedure Laterality Date   BRACHIOPLASTY  03/2016   BREAST BIOPSY Left 01/2018   Negative benign   CATARACT EXTRACTION Bilateral 2019   FOOT SURGERY Right    benign cyst   KNEE ARTHROSCOPY Left    LAPAROSCOPIC GASTRIC BANDING  2004   LAPAROSCOPIC REPAIR AND REMOVAL OF GASTRIC BAND     8/14   TUBAL LIGATION     age 82   UTERINE FIBROID SURGERY  2001    Family History  Problem Relation Age of Onset   Cancer Mother        lung   Diabetes Father    Emphysema Father    Melanoma Brother    Colon cancer Neg Hx    Colon polyps Neg Hx    Esophageal cancer Neg Hx    Stomach cancer Neg Hx    Rectal cancer Neg Hx   No family history of [place], Turner Syndrome, or Ehlers Danlos  Social History Social History   Tobacco Use   Smoking status: Never    Smokeless tobacco: Never  Vaping Use   Vaping Use: Never used  Substance Use Topics   Alcohol use: Yes    Alcohol/week: 1.0 - 2.0 standard drink    Types: 1 - 2 Glasses of wine per week   Drug use: No    Current Outpatient Medications  Medication Sig Dispense Refill   B Complex Vitamins (B COMPLEX PO) Take by mouth.     buPROPion (WELLBUTRIN XL) 300 MG 24 hr tablet Take     1 tablet      every Morning       for Mood, Focus & Concentration (Patient not taking: Reported on 11/24/2020) 90 tablet 0   Cholecalciferol (VITAMIN D) 2000 UNITS tablet Take 2,000 Units by mouth daily.     diclofenac (VOLTAREN) 75 MG EC tablet Take 1 tablet (75 mg total) by mouth 2 (two) times daily with a meal. As needed for back pain flare. 60 tablet 0   diclofenac Sodium (VOLTAREN) 1 % GEL Apply 4 g topically 4 (four) times daily. 100 g 3   fexofenadine (ALLEGRA) 60 MG tablet Take 60 mg by mouth daily as needed for allergies or rhinitis.  levothyroxine (SYNTHROID) 125 MCG tablet TAKE 1 TABLET EXCEPT 1/2 TAB ON Sunday BY MOUTH DAILY ON AN EMPTY STOMACH WITH ONLY WATER FOR 30 MINUTES & NO ANTACID MEDS, CALCIUM OR MAGNESIUM FOR 4 HOURS & AVOID BIOTIN 90 tablet 1   MAGNESIUM PO Take by mouth daily.     mometasone (NASONEX) 50 MCG/ACT nasal spray instill 2 sprays into each nostril once daily 17 g 3   Semaglutide,0.25 or 0.5MG /DOS, 2 MG/1.5ML SOPN Start by injecting 0.25 mg into skin once weekly for 4 weeks, then if tolerating increase to 0.5 mg into skin weekly. 4.5 mL 0   Wheat Dextrin (BENEFIBER) POWD Take by mouth 2 (two) times daily.    Vital Signs: Vitals:   01/16/21 1437  BP: (!) 147/100  Pulse: 100  Resp: 20  SpO2: 98%   BP rechecked and was systolic was lower. Per patient, she is extremely anxious.   Allergies: No Known Allergies   Physical Exam: CV-RRR, no murmur Pulmonary-Clear to auscultation bilaterally Abdomen-Soft, non tender, bowel sounds present Extremities-No LE edema  Diagnostic  Tests:  2D Echo, 3D Echo, Cardiac Doppler, Color Doppler and Strain  Analysis done 12/26/2020  IMPRESSIONS     1. Left ventricular ejection fraction, by estimation, is 55 to 60%. Left  ventricular ejection fraction by 3D volume is 55 %. The left ventricle has  normal function. The left ventricle has no regional wall motion  abnormalities. There is mild concentric  left ventricular hypertrophy. Left ventricular diastolic parameters are  consistent with Grade I diastolic dysfunction (impaired relaxation).   2. Right ventricular systolic function is normal. The right ventricular  size is normal. There is normal pulmonary artery systolic pressure. The  estimated right ventricular systolic pressure is 75.1 mmHg.   3. The mitral valve is grossly normal. Mild mitral valve regurgitation.  No evidence of mitral stenosis.   4. The aortic valve is tricuspid. Aortic valve regurgitation is not  visualized. No aortic stenosis is present.   5. Aortic dilatation noted. There is mild dilatation of the ascending  aorta, measuring 41 mm.   6. The inferior vena cava is normal in size with greater than 50%  respiratory variability, suggesting right atrial pressure of 3 mmHg.   Comparison(s): No significant change from prior study.   FINDINGS   Left Ventricle: Left ventricular ejection fraction, by estimation, is 55  to 60%. Left ventricular ejection fraction by 3D volume is 55 %. The left  ventricle has normal function. The left ventricle has no regional wall  motion abnormalities. The left  ventricular internal cavity size was normal in size. There is mild  concentric left ventricular hypertrophy. Left ventricular diastolic  parameters are consistent with Grade I diastolic dysfunction (impaired  relaxation).   Right Ventricle: The right ventricular size is normal. No increase in  right ventricular wall thickness. Right ventricular systolic function is  normal. There is normal pulmonary artery  systolic pressure. The tricuspid  regurgitant velocity is 2.31 m/s, and   with an assumed right atrial pressure of 3 mmHg, the estimated right  ventricular systolic pressure is 70.0 mmHg.   Left Atrium: Left atrial size was normal in size.   Right Atrium: Right atrial size was normal in size.   Pericardium: Trivial pericardial effusion is present.   Mitral Valve: The mitral valve is grossly normal. Mild mitral valve  regurgitation. No evidence of mitral valve stenosis.   Tricuspid Valve: The tricuspid valve is grossly normal. Tricuspid valve  regurgitation is trivial.  No evidence of tricuspid stenosis.   Aortic Valve: The aortic valve is tricuspid. Aortic valve regurgitation is  not visualized. No aortic stenosis is present.   Pulmonic Valve: The pulmonic valve was grossly normal. Pulmonic valve  regurgitation is mild. No evidence of pulmonic stenosis.   Aorta: The aortic root is normal in size and structure and aortic  dilatation noted. There is mild dilatation of the ascending aorta,  measuring 41 mm.   Venous: The inferior vena cava is normal in size with greater than 50%  respiratory variability, suggesting right atrial pressure of 3 mmHg.   IAS/Shunts: The interatrial septum appears to be lipomatous. The atrial  septum is grossly normal.   Impression and Plan: As discussed with patient, strict control of blood pressure is important. Her BP is very elevated in office today. Patient states she checks her BP fairly regularly and her systolic is usually 035 or less. She is very anxious at today's office visit because she thought she might need to have surgery. We had a long discussion that she does not need surgery she only needs further surveillance at this time. She has not needed any medication over the last several years. She did state she was tried on a BP medication over 10 years ago, but it made her BP too low and she was dizzy. She is followed fairly regularly by medical  NP. Her total cholesterol was 211 and LD was 119 this past September. She is not on a statin and I will defer to Liane Comber NP, who provides most of her medical care, when to initiate. Patient is trying to exercise and lose weight as well. We did discuss avoidance of Fluoroquinolone antibiotics as recent international studies have shown an increased risk of aortic aneurysm and dissection after fluoroquinolone use. Patient will return to office in 6 months with CTA chest to further evaluated dilatation of aorta and confirm findings on echo. If this CTA study is stable, she will have yearly surveillance thereafter.     Nani Skillern, PA-C [facility] [phone]

## 2021-01-16 NOTE — Progress Notes (Signed)
      [address]       [place] [address]             [phone]       

## 2021-01-16 NOTE — Patient Instructions (Addendum)
Strict control of blood pressure is very important  Avoidance of fluoroquinolone antibiotics  Avoidance of very strenuous exercise, activity;no heavy lifting (about greater than 40 pounds)  Will defer statin initiation to Nurse Practitioner, Liane Comber, who regularly sees patient

## 2021-01-24 ENCOUNTER — Other Ambulatory Visit: Payer: Self-pay

## 2021-01-24 ENCOUNTER — Encounter: Payer: Self-pay | Admitting: Adult Health

## 2021-01-24 ENCOUNTER — Ambulatory Visit: Payer: Medicare PPO | Admitting: Adult Health

## 2021-01-24 VITALS — BP 142/92 | HR 99 | Wt 209.0 lb

## 2021-01-24 DIAGNOSIS — R519 Headache, unspecified: Secondary | ICD-10-CM

## 2021-01-24 DIAGNOSIS — U071 COVID-19: Secondary | ICD-10-CM | POA: Diagnosis not present

## 2021-01-24 MED ORDER — PREDNISONE 20 MG PO TABS: ORAL_TABLET | ORAL | 0 refills | Status: DC

## 2021-01-24 MED ORDER — PROMETHAZINE HCL 25 MG RE SUPP: 25.0000 mg | Freq: Four times a day (QID) | RECTAL | 0 refills | Status: DC | PRN

## 2021-01-24 MED ORDER — NIRMATRELVIR/RITONAVIR (PAXLOVID) TABLET (RENAL DOSING): 2.0000 | ORAL_TABLET | Freq: Two times a day (BID) | ORAL | 0 refills | Status: AC

## 2021-01-24 MED ORDER — NIRMATRELVIR/RITONAVIR (PAXLOVID) TABLET (RENAL DOSING): 2.0000 | ORAL_TABLET | Freq: Two times a day (BID) | ORAL | 0 refills | Status: DC

## 2021-01-24 NOTE — Progress Notes (Signed)
THIS ENCOUNTER IS A VIRTUAL VISIT DUE TO COVID-19 - PATIENT WAS NOT SEEN IN THE OFFICE.  PATIENT HAS CONSENTED TO VIRTUAL VISIT / TELEMEDICINE VISIT   Virtual Visit via telephone Note  I connected with  Kimberly Bowers on 01/24/2021 by telephone.  I verified that I am speaking with the correct person using two identifiers.    I discussed the limitations of evaluation and management by telemedicine and the availability of in person appointments. The patient expressed understanding and agreed to proceed.  History of Present Illness:  BP (!) 142/92   Pulse 99   Wt 209 lb (94.8 kg)   LMP 03/11/2000   BMI 34.78 kg/m  70 y.o. patient contacted office reporting GI sx. she tested positive by rapid screen. OV was conducted by telephone to minimize exposure. This patient was vaccinated for covid 19, last 11/14/2020.   Sx began 3 days ago with scratchy throat, cough overnight; yesterday had congestion/cough but this resolved then developed headache, mild nausea, today having severe nausea/vomiting (5 episodes of water)- was having dry heaves, had frequent soft stools (not liquid). She called earlier re: severe nausea and was prescribed phenergan suppositories, reports this did help significantly, no emesis or loose stools in several hours. Feels weak, still has HA, hasn't tried taking anything for this due to nausea until recent. Feels weak but denies dizziness, focal neuro sx. Has been holding down water. She feels headache is similar to rare severe she has had in the past that resolved after presenting to ED for "IV coctail."   Treatments tried so far: phenergan suppositories  Exposures: husband had covid 19 prior to her. He had mainly URI, is doing better since paxlovid.   Medications  Current Outpatient Medications (Endocrine & Metabolic):    levothyroxine (SYNTHROID) 125 MCG tablet, TAKE 1 TABLET EXCEPT 1/2 TAB ON Sunday BY MOUTH DAILY ON AN EMPTY STOMACH WITH ONLY WATER FOR 30 MINUTES & NO ANTACID  MEDS, CALCIUM OR MAGNESIUM FOR 4 HOURS & AVOID BIOTIN   predniSONE (DELTASONE) 20 MG tablet, 2 tablets daily for 3 days, 1 tablet daily for 4 days.   Semaglutide,0.25 or 0.5MG /DOS, 2 MG/1.5ML SOPN, Start by injecting 0.25 mg into skin once weekly for 4 weeks, then if tolerating increase to 0.5 mg into skin weekly.   Current Outpatient Medications (Respiratory):    fexofenadine (ALLEGRA) 60 MG tablet, Take 60 mg by mouth daily as needed for allergies or rhinitis.   promethazine (PHENERGAN) 25 MG suppository, Place 1 suppository (25 mg total) rectally every 6 (six) hours as needed for nausea or vomiting.  Current Outpatient Medications (Analgesics):    diclofenac (VOLTAREN) 75 MG EC tablet, Take 1 tablet (75 mg total) by mouth 2 (two) times daily with a meal. As needed for back pain flare.   Current Outpatient Medications (Other):    B Complex Vitamins (B COMPLEX PO), Take by mouth.   Cholecalciferol (VITAMIN D) 2000 UNITS tablet, Take 2,000 Units by mouth daily.   diclofenac Sodium (VOLTAREN) 1 % GEL, Apply 4 g topically 4 (four) times daily.   MAGNESIUM PO, Take by mouth daily.   Wheat Dextrin (BENEFIBER) POWD, Take by mouth 2 (two) times daily.   nirmatrelvir/ritonavir EUA, renal dosing, (PAXLOVID) 10 x 150 MG & 10 x 100MG  TABS, Take 2 tablets by mouth 2 (two) times daily for 5 days. Patient GFR is 63 (however concern for dehydration). Take nirmatrelvir (150 mg) one tablet twice daily for 5 days and ritonavir (100 mg) one tablet  twice daily for 5 days.  Allergies: No Known Allergies  Problem list She has Anxiety; Hypothyroid; Allergy; Mixed hyperlipidemia; Morbid obesity ([place]); Vitamin D deficiency; Medication management; Aortic dilatation ([facility]); Gallstones; CKD (chronic kidney disease) stage 3, GFR 30-59 ml/min ([place]); Arthritis of left knee; Proteinuria; and COVID-19 (01/23/2019) on their problem list.   Social History:   reports that she has never smoked. She has never used smokeless  tobacco. She reports current alcohol use of about 1.0 - 2.0 standard drink per week. She reports that she does not use drugs.  Observations/Objective:  General : Fatigued sounding patient in no acute distress HEENT: no hoarseness, no cough for duration of visit Lungs: speaks in complete sentences, no audible wheezing, no apparent distress Neurological: alert, oriented x 3 Psychiatric: pleasant, judgement appropriate   Assessment and Plan:  Covid 19 Covid 19 positive per rapid screening test at home in vaccinated and boosted Risk factors include: age Symptoms are: mild/mod I discussed my concern for possible dehydration, electrolyte abnormalities; ED precautions discussed, will hold off for now as improved with phenergan supp; electrolyte supplemented fluids encouraged with close monitoring Due to co morbid conditions and risk factors, discussed antivirals she does with to proceed with paxlovid; concern for possible dehydration, will prescribe renal dose out of caution Will try steroid for HA - if not improving, worsening weakness, recurrent emesis, dizziness, etc advised call 911 for transport to ED for labs, fluids  Requested she message to follow up with me in AM on status Immue support reviewed  Take tylenol PRN temp 101+ Push hydration Regular ambulation or calf exercises exercises for clot prevention and 81 mg ASA unless contraindicated Sx supportive therapy suggested Follow up via mychart or telephone if needed Advised patient obtain O2 monitor; present to ED if persistently <90% or with severe dyspnea, CP, fever uncontrolled by tylenol, confusion, sudden decline Should remain in isolation until at least 5 days from onset of sx, 24-48 hours fever free without tylenol, sx such as cough are improved.  COVID-19 (01/23/2019) -     nirmatrelvir/ritonavir EUA, renal dosing, (PAXLOVID) 10 x 150 MG & 10 x 100MG  TABS; Take 2 tablets by mouth 2 (two) times daily for 5 days. Patient GFR is  63 (however concern for dehydration). Take nirmatrelvir (150 mg) one tablet twice daily for 5 days and ritonavir (100 mg) one tablet twice daily for 5 days.  Acute intractable headache, unspecified headache type -     predniSONE (DELTASONE) 20 MG tablet; 2 tablets daily for 3 days, 1 tablet daily for 4 days.    Follow Up Instructions:  I discussed the assessment and treatment plan with the patient. The patient was provided an opportunity to ask questions and all were answered. The patient agreed with the plan and demonstrated an understanding of the instructions.   The patient was advised to call back or seek an in-person evaluation if the symptoms worsen or if the condition fails to improve as anticipated.  I provided 20 minutes of non-face-to-face time during this encounter.   Izora Ribas, NP

## 2021-02-06 NOTE — Progress Notes (Signed)
Assessment and Plan:  Ireene was seen today for acute visit.  Diagnoses and all orders for this visit:  Labile hypertension Home log well controlled; suspect element of white coat hypertension/recent anxiety while at medical offices Monitor blood pressure at home; call if consistently over 130/80 Continue DASH diet.   Reminder to go to the ER if any CP, SOB, nausea, dizziness, severe HA, changes vision/speech, left arm numbness and tingling and jaw pain.  Morbid obesity ([place]) Did perceive benefit with semaglutide injection samples; doesn't qualify for glucose management. Check with insurance about weight loss med benefit Continue lifestyle; keep food log, high lean protein, avoid processed carb. Focus on resistance exercises for BMR/muscle mass. Close follow up next month with food log.    Further disposition pending results of labs. Discussed med's effects and SE's.   Over 30 minutes of exam, counseling, chart review, and critical decision making was performed.   Future Appointments  Date Time Provider [place]  03/27/2021 11:00 AM Liane Comber, NP [facility] None  07/12/2021  9:30 AM Liane Comber, NP [facility] None  11/27/2021  9:00 AM Liane Comber, NP [facility] None    ------------------------------------------------------------------------------------------------------------------   HPI BP 136/86   Pulse (!) 102   Temp (!) 97.5 F (36.4 C)   Wt 211 lb (95.7 kg)   LMP 03/11/2000   SpO2 93%   BMI 35.11 kg/m  70 y.o.female presents for discussion of labile BPs, and weight loss with semaglutide injections  She took last injection second 0.5 mg /week dose on 01/29/2021. Did not some appetite suppression with this dose. Some constipation. Unfortunately doesn't qualify for ozempic. She hasn't checked with insurance if weight loss med benefit (wegovy, saxenda). She has failed wellbutrin, naltrexone, phentermine. She is working on diet, mediterranean, and regular  exercise (weights, recumbent bike).   Lab Results  Component Value Date   HGBA1C 4.8 11/24/2020   Her blood pressure has been controlled at home (labile 110s-70s-rare 130s/80s), today their BP is BP: 136/86, improved from initial check of 144/110.   She does workout. She denies chest pain, shortness of breath, dizziness.   Past Medical History:  Diagnosis Date   Allergy    Anxiety    Arthritis    Diverticulitis 2004   Hypothyroid    Neutropenia ([facility])      No Known Allergies  Current Outpatient Medications on File Prior to Visit  Medication Sig   B Complex Vitamins (B COMPLEX PO) Take by mouth.   Cholecalciferol (VITAMIN D) 2000 UNITS tablet Take 2,000 Units by mouth daily.   diclofenac Sodium (VOLTAREN) 1 % GEL Apply 4 g topically 4 (four) times daily.   fexofenadine (ALLEGRA) 60 MG tablet Take 60 mg by mouth daily as needed for allergies or rhinitis.   levothyroxine (SYNTHROID) 125 MCG tablet TAKE 1 TABLET EXCEPT 1/2 TAB ON Sunday BY MOUTH DAILY ON AN EMPTY STOMACH WITH ONLY WATER FOR 30 MINUTES & NO ANTACID MEDS, CALCIUM OR MAGNESIUM FOR 4 HOURS & AVOID BIOTIN   MAGNESIUM PO Take by mouth daily.   Psyllium (METAMUCIL PO) Take by mouth.   Semaglutide,0.25 or 0.5MG /DOS, 2 MG/1.5ML SOPN Start by injecting 0.25 mg into skin once weekly for 4 weeks, then if tolerating increase to 0.5 mg into skin weekly.   Wheat Dextrin (BENEFIBER) POWD Take by mouth 2 (two) times daily.   diclofenac (VOLTAREN) 75 MG EC tablet Take 1 tablet (75 mg total) by mouth 2 (two) times daily with a meal. As needed for back  pain flare. (Patient not taking: Reported on 02/07/2021)   predniSONE (DELTASONE) 20 MG tablet 2 tablets daily for 3 days, 1 tablet daily for 4 days. (Patient not taking: Reported on 02/07/2021)   promethazine (PHENERGAN) 25 MG suppository Place 1 suppository (25 mg total) rectally every 6 (six) hours as needed for nausea or vomiting. (Patient not taking: Reported on 02/07/2021)   No  current facility-administered medications on file prior to visit.   Surgical History:  She  has a past surgical history that includes Laparoscopic gastric banding (2004); Knee arthroscopy (Left); Uterine fibroid surgery (2001); Foot surgery (Right); Tubal ligation; Laparoscopic repair and removal of gastric band; Brachioplasty (03/2016); Cataract extraction (Bilateral, 2019); and Breast biopsy (Left, 01/2018). Family History:  Herfamily history includes Cancer in her mother; Diabetes in her father; Emphysema in her father; Melanoma in her brother. Social History:   reports that she has never smoked. She has never used smokeless tobacco. She reports current alcohol use of about 1.0 - 2.0 standard drink per week. She reports that she does not use drugs.   ROS: all negative except above.   Physical Exam:  BP 136/86   Pulse (!) 102   Temp (!) 97.5 F (36.4 C)   Wt 211 lb (95.7 kg)   LMP 03/11/2000   SpO2 93%   BMI 35.11 kg/m   General Appearance: Well nourished, obese elder female in no apparent distress. Eyes: PERRLA, conjunctiva no swelling or erythema ENT/Mouth: mask in place; Hearing normal.  Neck: Supple, thyroid normal.  Respiratory: Respiratory effort normal, BS equal bilaterally without rales, rhonchi, wheezing or stridor.  Cardio: RRR with no MRGs. Brisk peripheral pulses without edema.  Abdomen: Soft, obese abdomen, + BS.  Non tender, no guarding, rebound, hernias, masses. Lymphatics: Non tender without lymphadenopathy.  Musculoskeletal: no obvious deformity; normal gait.  Skin: Warm, dry without rashes, lesions, ecchymosis.  Neuro: Normal muscle tone Psych: Awake and oriented X 3, normal affect, Insight and Judgment appropriate.    Izora Ribas, NP 3:24 PM [facility]

## 2021-02-07 ENCOUNTER — Encounter: Payer: Self-pay | Admitting: Adult Health

## 2021-02-07 ENCOUNTER — Ambulatory Visit (INDEPENDENT_AMBULATORY_CARE_PROVIDER_SITE_OTHER): Payer: Medicare PPO | Admitting: Adult Health

## 2021-02-07 ENCOUNTER — Other Ambulatory Visit: Payer: Self-pay

## 2021-02-07 VITALS — BP 136/86 | HR 102 | Temp 97.5°F | Wt 211.0 lb

## 2021-02-07 DIAGNOSIS — R0989 Other specified symptoms and signs involving the circulatory and respiratory systems: Secondary | ICD-10-CM | POA: Diagnosis not present

## 2021-02-07 HISTORY — DX: Other specified symptoms and signs involving the circulatory and respiratory systems: R09.89

## 2021-02-07 NOTE — Patient Instructions (Signed)
Wegovy or saxenda -   Check with insurance if they have weight loss med coverage If they have a preferred med they want you to try    Suggest keeping a food log   High-Fiber Eating Plan Fiber, also called dietary fiber, is a type of carbohydrate. It is found foods such as fruits, vegetables, whole grains, and beans. A high-fiber diet can have many health benefits. Your health care provider may recommend a high-fiber diet to help: Prevent constipation. Fiber can make your bowel movements more regular. Lower your cholesterol. Relieve the following conditions: Inflammation of veins in the anus (hemorrhoids). Inflammation of specific areas of the digestive tract (uncomplicated diverticulosis). A problem of the large intestine, also called the colon, that sometimes causes pain and diarrhea (irritable bowel syndrome, or IBS). Prevent overeating as part of a weight-loss plan. Prevent heart disease, type 2 diabetes, and certain cancers. What are tips for following this plan? Reading food labels  Check the nutrition facts label on food products for the amount of dietary fiber. Choose foods that have 5 grams of fiber or more per serving. The goals for recommended daily fiber intake include: Men (age 20 or younger): 34-38 g. Men (over age 53): 28-34 g. Women (age 79 or younger): 25-28 g. Women (over age 75): 22-25 g. Your daily fiber goal is _____________ g. Shopping Choose whole fruits and vegetables instead of processed forms, such as apple juice or applesauce. Choose a wide variety of high-fiber foods such as avocados, lentils, oats, and kidney beans. Read the nutrition facts label of the foods you choose. Be aware of foods with added fiber. These foods often have high sugar and sodium amounts per serving. Cooking Use whole-grain flour for baking and cooking. Cook with brown rice instead of white rice. Meal planning Start the day with a breakfast that is high in fiber, such as a cereal  that contains 5 g of fiber or more per serving. Eat breads and cereals that are made with whole-grain flour instead of refined flour or white flour. Eat brown rice, bulgur wheat, or millet instead of white rice. Use beans in place of meat in soups, salads, and pasta dishes. Be sure that half of the grains you eat each day are whole grains. General information You can get the recommended daily intake of dietary fiber by: Eating a variety of fruits, vegetables, grains, nuts, and beans. Taking a fiber supplement if you are not able to take in enough fiber in your diet. It is better to get fiber through food than from a supplement. Gradually increase how much fiber you consume. If you increase your intake of dietary fiber too quickly, you may have bloating, cramping, or gas. Drink plenty of water to help you digest fiber. Choose high-fiber snacks, such as berries, raw vegetables, nuts, and popcorn. What foods should I eat? Fruits Berries. Pears. Apples. Oranges. Avocado. Prunes and raisins. Dried figs. Vegetables Sweet potatoes. Spinach. Kale. Artichokes. Cabbage. Broccoli. Cauliflower. Green peas. Carrots. Squash. Grains Whole-grain breads. Multigrain cereal. Oats and oatmeal. Brown rice. Barley. Bulgur wheat. [place]. Quinoa. Bran muffins. Popcorn. Rye wafer crackers. Meats and other proteins Navy beans, kidney beans, and pinto beans. Soybeans. Split peas. Lentils. Nuts and seeds. Dairy Fiber-fortified yogurt. Beverages Fiber-fortified soy milk. Fiber-fortified orange juice. Other foods Fiber bars. The items listed above may not be a complete list of recommended foods and beverages. Contact a dietitian for more information. What foods should I avoid? Fruits Fruit juice. Cooked, strained fruit. Vegetables Maceo Pro  potatoes. Canned vegetables. Well-cooked vegetables. Grains White bread. Pasta made with refined flour. White rice. Meats and other proteins Fatty cuts of meat. Fried chicken  or fried fish. Dairy Milk. Yogurt. Cream cheese. Sour cream. Fats and oils Butters. Beverages Soft drinks. Other foods Cakes and pastries. The items listed above may not be a complete list of foods and beverages to avoid. Talk with your dietitian about what choices are best for you. Summary Fiber is a type of carbohydrate. It is found in foods such as fruits, vegetables, whole grains, and beans. A high-fiber diet has many benefits. It can help to prevent constipation, lower blood cholesterol, aid weight loss, and reduce your risk of heart disease, diabetes, and certain cancers. Increase your intake of fiber gradually. Increasing fiber too quickly may cause cramping, bloating, and gas. Drink plenty of water while you increase the amount of fiber you consume. The best sources of fiber include whole fruits and vegetables, whole grains, nuts, seeds, and beans. This information is not intended to replace advice given to you by your health care provider. Make sure you discuss any questions you have with your health care provider. Document Revised: 07/01/2019 Document Reviewed: 07/01/2019 Elsevier Patient Education  2022 [employer].

## 2021-02-08 ENCOUNTER — Other Ambulatory Visit: Payer: Self-pay | Admitting: Adult Health

## 2021-02-08 ENCOUNTER — Encounter: Payer: Self-pay | Admitting: Adult Health

## 2021-02-08 MED ORDER — WEGOVY 0.5 MG/0.5ML ~~LOC~~ SOAJ: 0.5000 mg | SUBCUTANEOUS | 1 refills | Status: DC

## 2021-02-09 ENCOUNTER — Other Ambulatory Visit: Payer: Self-pay | Admitting: Adult Health

## 2021-02-09 MED ORDER — WEGOVY 0.5 MG/0.5ML ~~LOC~~ SOAJ: 0.5000 mg | SUBCUTANEOUS | 1 refills | Status: DC

## 2021-02-12 DIAGNOSIS — H16223 Keratoconjunctivitis sicca, not specified as Sjogren's, bilateral: Secondary | ICD-10-CM | POA: Diagnosis not present

## 2021-02-19 ENCOUNTER — Other Ambulatory Visit: Payer: Self-pay | Admitting: Physician Assistant

## 2021-02-19 DIAGNOSIS — M170 Bilateral primary osteoarthritis of knee: Secondary | ICD-10-CM

## 2021-03-07 ENCOUNTER — Encounter: Payer: Self-pay | Admitting: Adult Health

## 2021-03-14 ENCOUNTER — Other Ambulatory Visit: Payer: Self-pay

## 2021-03-14 ENCOUNTER — Ambulatory Visit (INDEPENDENT_AMBULATORY_CARE_PROVIDER_SITE_OTHER): Payer: Medicare PPO

## 2021-03-14 DIAGNOSIS — Z79899 Other long term (current) drug therapy: Secondary | ICD-10-CM | POA: Diagnosis not present

## 2021-03-14 NOTE — Progress Notes (Signed)
Patient presents to the office for a nurse visit for instruction on Wegovy Injection. Patient shown how to administer without any complications.

## 2021-03-15 DIAGNOSIS — Z1231 Encounter for screening mammogram for malignant neoplasm of breast: Secondary | ICD-10-CM | POA: Diagnosis not present

## 2021-03-15 LAB — HM MAMMOGRAPHY

## 2021-03-23 NOTE — Progress Notes (Signed)
3 MONTH FOLLOW UP  Assessment:   Aortic dilatation (HCC) See on Echo 2021, interval enlargement to 41 mm in 2022 and now follows with CV surgery  Control BP   Morbid obesity ([place]) - BMI 35 + hld, knee arthritis Lack of benefit with wellbutrin, naltrexone, phentermine Now on wegovy, tolerating 0.5 mg weekly but unsure if beefit yet, will increase to 1 mg/week with upcoming refill High fiber diet handout given, goals set for exercise Close follow up 3-4 months  Mixed hyperlipidemia -     Lipid panel Discussed CVD risk, she is receptive to starting rosuvastatin today after discussing of risks and benefits.  Start rosuvastatin 5 mg three days a week and titrate up to taking daily if tolerating in 1 month  check lipids decrease fatty foods increase activity.   CKD IIIa (HCC) Increase fluids, avoid NSAIDS, monitor sugars, will monitor - CMP/GFR, UA  Medication management -     CBC with Differential/Platelet -     COMPLETE METABOLIC PANEL WITH GFR -     Magnesium  Vitamin D deficiency -     VITAMIN D 25 Hydroxy (Vit-D Deficiency, Fractures)  Hypothyroidism, unspecified type -     TSH Hypothyroidism-check TSH level, continue medications the same, reminded to take on an empty stomach 30-28mns before food.   Allergic state, subsequent encounter Continue to monitor  Anxiety Monitor  R ear fullness, R ear cerumen impaction  Unsuccessful attempt to irrigate today, try drops and baby bulb syring irrigation at home 3 days a week, keep upcoming ENT appointment or follow up here sooner if change in sx   Orders Placed This Encounter  Procedures   Urinalysis, Routine w reflex microscopic   CBC with Differential/Platelet   COMPLETE METABOLIC PANEL WITH GFR   Magnesium   Lipid panel   TSH   Urinalysis, Routine w reflex microscopic    Future Appointments  Date Time Provider [place] 07/12/2021  9:30 AM CLiane Comber NP [facility] None  11/27/2021  9:00 AM CLiane Comber NP [facility] None     Subjective:   Kimberly Miedemais a 71y.o. female who presents for 3 month follow up on hypertension, hypothyroidism, obesity, hyperlipidemia, vitamin D def.   Left knee arthritis, intermittent back pain, follows with [facility] - Dr. BLayne Benton  has cortisone shots and doing strengthening exercises.   She has persistent sensation of R ear fullness, has seen ENT with no specific findings or dx, ? Eustachan tube dysfunction, hasn't improved with antihistamine and nasal steroids, has follow up planned later this month.   BMI is Body mass index is 35.48 kg/m., she is working on diet and exercise, walking, doing free weights and recumbent bike.  She started on wellbutrin and naltrexone, hasn't noted benefit and stopped, Wellbutrin caused some sleep problems. Didn't tolerate phentermine remotely.  Following mediterranean diet.  She was recently started on wegovy, up to 0.5 mg wegovy third week, would like to  Wt Readings from Last 3 Encounters:  03/27/21 213 lb 3.2 oz (96.7 kg)  02/07/21 211 lb (95.7 kg)  01/24/21 209 lb (94.8 kg)   She had ECHO 09/2019 with EF 533-54% grade 1 dystolic dysfunction, trivial MVR, did show mild ascending aortic dilation of 38 mm, follow up ECHO 12/26/2020 showed dilation of 41 mm and was referred to CV surgery, they are planning 6 month follow up for CTA.   Her blood pressure has been controlled at home, today their BP is BP: 128/76 She does workout,  she is walking daily, doing free weights.  She denies chest pain, shortness of breath, dizziness.   Current 10 year CVD risk 20.5% discussed today.   She is not on cholesterol medication and denies myalgias. Her cholesterol is not at goal. The cholesterol last visit was:   Lab Results  Component Value Date   CHOL 211 (H) 11/24/2020   HDL 70 11/24/2020   LDLCALC 119 (H) 11/24/2020   TRIG 109 11/24/2020   CHOLHDL 3.0 11/24/2020   Last A1C in the office was:  Lab Results   Component Value Date   HGBA1C 4.8 11/24/2020   She has CKD II/III, stable, monitored at this office; normal renal ultrasound in 11/2019, did incidentally show gallstones, has stopped diclofenac oral, takes rarely PRN Lab Results  Component Value Date   EGFR 65 11/24/2020   Patient is on Vitamin D supplement, 2000 IU daily.  Lab Results  Component Value Date   VD25OH 49 07/12/2020     She is on thyroid medication. Her medication was not changed last visit. She is on 140mg every day except 1/2 tab on Sunday.  She is not on a biotin.  Lab Results  Component Value Date   TSH 2.14 11/24/2020      Medication Review  Current Outpatient Medications (Endocrine & Metabolic):    levothyroxine (SYNTHROID) 125 MCG tablet, TAKE 1 TABLET EXCEPT 1/2 TAB ON Sunday BY MOUTH DAILY ON AN EMPTY STOMACH WITH ONLY WATER FOR 30 MINUTES & NO ANTACID MEDS, CALCIUM OR MAGNESIUM FOR 4 HOURS & AVOID BIOTIN  Current Outpatient Medications (Cardiovascular):    rosuvastatin (CRESTOR) 5 MG tablet, Take 1 tab daily to reduce risk of heart attack and stroke.  Current Outpatient Medications (Respiratory):    fexofenadine (ALLEGRA) 60 MG tablet, Take 60 mg by mouth daily as needed for allergies or rhinitis.  Current Outpatient Medications (Analgesics):    diclofenac (VOLTAREN) 75 MG EC tablet, Take 1 tablet (75 mg total) by mouth 2 (two) times daily with a meal. As needed for back pain flare.   Current Outpatient Medications (Other):    B Complex Vitamins (B COMPLEX PO), Take by mouth.   Cholecalciferol (VITAMIN D) 2000 UNITS tablet, Take 2,000 Units by mouth daily.   diclofenac Sodium (VOLTAREN) 1 % GEL, Apply 4 grams topically 4 times daily   MAGNESIUM PO, Take by mouth daily.   Psyllium (METAMUCIL PO), Take by mouth.   Semaglutide-Weight Management (WEGOVY) 1 MG/0.5ML SOAJ, Inject 1 mg into the skin once a week.   Wheat Dextrin (BENEFIBER) POWD, Take by mouth 2 (two) times daily.  Current Problems  (verified) Patient Active Problem List   Diagnosis Date Noted   Labile hypertension 02/07/2021   Proteinuria 11/25/2020   Arthritis of left knee 07/12/2020   CKD (chronic kidney disease) stage 3, GFR 30-59 ml/min (HCC) 01/11/2020   Gallstones 01/10/2020   [place]   Morbid obesity ([place] 03/17/2014   Vitamin D deficiency 03/17/2014   Medication management 03/17/2014   Mixed hyperlipidemia 03/25/2013   Anxiety    Hypothyroid    Allergy     Allergies No Known Allergies  SURGICAL HISTORY She  has a past surgical history that includes Laparoscopic gastric banding (2004); Knee arthroscopy (Left); Uterine fibroid surgery (2001); Foot surgery (Right); Tubal ligation; Laparoscopic repair and removal of gastric band; Brachioplasty (03/2016); Cataract extraction (Bilateral, 2019); and Breast biopsy (Left, 01/2018). FAMILY HISTORY Her family history includes Cancer in her mother; Diabetes in her father; Emphysema  in her father; Melanoma in her brother. SOCIAL HISTORY She  reports that she has never smoked. She has never used smokeless tobacco. She reports current alcohol use of about 1.0 - 2.0 standard drink per week. She reports that she does not use drugs.   Review of Systems:  Review of Systems  Constitutional: Negative.  Negative for malaise/fatigue and weight loss.  HENT: Negative.  Negative for congestion, ear pain, hearing loss, sore throat and tinnitus.        R ear fullness  Eyes: Negative.  Negative for blurred vision and double vision.  Respiratory: Negative.  Negative for cough, sputum production, shortness of breath and wheezing.   Cardiovascular: Negative.  Negative for chest pain, palpitations, orthopnea, claudication, leg swelling and PND.  Gastrointestinal: Negative.  Negative for abdominal pain, blood in stool, constipation, diarrhea, heartburn, melena, nausea and vomiting.  Genitourinary: Negative.   Musculoskeletal:  Positive for joint pain  (left knee). Negative for falls and myalgias.  Skin: Negative.  Negative for rash.  Neurological: Negative.  Negative for dizziness, tingling, sensory change, weakness and headaches.  Endo/Heme/Allergies: Negative.  Negative for polydipsia.  Psychiatric/Behavioral: Negative.  Negative for depression, memory loss, substance abuse and suicidal ideas. The patient is not nervous/anxious and does not have insomnia.   All other systems reviewed and are negative.  Objective:   Today's Vitals   03/27/21 1055  BP: 128/76  Pulse: 96  Resp: 16  Temp: 97.9 F (36.6 C)  SpO2: 98%  Weight: 213 lb 3.2 oz (96.7 kg)  Height: _0  (1.651 m)    General appearance: alert, no distress, WD/WN,  female HEENT: normocephalic, sclerae anicteric, R canal obstructed by dry wax, L TM pearly, nares patent, no discharge or erythema, pharynx normal Oral cavity: MMM, no lesions Neck: supple, no lymphadenopathy, no thyromegaly, no masses Heart: RRR, normal S1, S2, no radiation to carotids Lungs: CTA bilaterally, no wheezes, rhonchi, or rales Abdomen: +bs, soft, non tender, non distended, no masses, no hepatomegaly, no splenomegaly Musculoskeletal: nontender, no swelling, than 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  Extremities: no edema, no cyanosis, no clubbing Pulses: 2+ symmetric, upper and lower extremities, normal cap refill Neurological: alert, oriented x 3, CN2-12 intact, strength normal upper extremities and lower extremities, sensation normal throughout, DTRs 2+ throughout, no cerebellar signs, gait antalgic Psychiatric: normal affect, behavior normal, pleasant   Izora Ribas, NP 12:40 PM [facility]

## 2021-03-27 ENCOUNTER — Ambulatory Visit: Payer: Medicare PPO | Admitting: Adult Health

## 2021-03-27 ENCOUNTER — Encounter: Payer: Self-pay | Admitting: Adult Health

## 2021-03-27 ENCOUNTER — Other Ambulatory Visit: Payer: Self-pay

## 2021-03-27 VITALS — BP 128/76 | HR 96 | Temp 97.9°F | Resp 16 | Ht 65.0 in | Wt 213.2 lb

## 2021-03-27 DIAGNOSIS — E559 Vitamin D deficiency, unspecified: Secondary | ICD-10-CM | POA: Diagnosis not present

## 2021-03-27 DIAGNOSIS — E039 Hypothyroidism, unspecified: Secondary | ICD-10-CM

## 2021-03-27 DIAGNOSIS — I77819 Aortic ectasia, unspecified site: Secondary | ICD-10-CM | POA: Diagnosis not present

## 2021-03-27 DIAGNOSIS — N1831 Chronic kidney disease, stage 3a: Secondary | ICD-10-CM | POA: Diagnosis not present

## 2021-03-27 DIAGNOSIS — Z79899 Other long term (current) drug therapy: Secondary | ICD-10-CM | POA: Diagnosis not present

## 2021-03-27 DIAGNOSIS — R809 Proteinuria, unspecified: Secondary | ICD-10-CM

## 2021-03-27 DIAGNOSIS — R0989 Other specified symptoms and signs involving the circulatory and respiratory systems: Secondary | ICD-10-CM | POA: Diagnosis not present

## 2021-03-27 DIAGNOSIS — E782 Mixed hyperlipidemia: Secondary | ICD-10-CM | POA: Diagnosis not present

## 2021-03-27 LAB — LIPID PANEL
Cholesterol: 221 mg/dL — ABNORMAL HIGH (ref <200)
HDL: 79 mg/dL (ref >=50)
LDL Cholesterol (Calc): 124 mg/dL (calc) — ABNORMAL HIGH
Non-HDL Cholesterol (Calc): 142 mg/dL (calc) — ABNORMAL HIGH (ref <130)
Total CHOL/HDL Ratio: 2.8 (calc) (ref <5.0)
Triglycerides: 84 mg/dL (ref <150)

## 2021-03-27 LAB — COMPLETE METABOLIC PANEL WITH GFR
AG Ratio: 2.3 (calc) (ref 1.0–2.5)
ALT: 13 U/L (ref 6–29)
AST: 19 U/L (ref 10–35)
Albumin: 4.9 g/dL (ref 3.6–5.1)
Alkaline phosphatase (APISO): 57 U/L (ref 37–153)
BUN: 18 mg/dL (ref 7–25)
CO2: 29 mmol/L (ref 20–32)
Calcium: 9.7 mg/dL (ref 8.6–10.4)
Chloride: 100 mmol/L (ref 98–110)
Creat: 0.96 mg/dL (ref 0.60–1.00)
Globulin: 2.1 g/dL (calc) (ref 1.9–3.7)
Glucose, Bld: 82 mg/dL (ref 65–99)
Potassium: 4.5 mmol/L (ref 3.5–5.3)
Sodium: 138 mmol/L (ref 135–146)
Total Bilirubin: 0.5 mg/dL (ref 0.2–1.2)
Total Protein: 7 g/dL (ref 6.1–8.1)
eGFR: 63 mL/min/{1.73_m2} (ref >=60)

## 2021-03-27 LAB — CBC WITH DIFFERENTIAL/PLATELET
Absolute Monocytes: 277 cells/uL (ref 200–950)
Basophils Absolute: 50 cells/uL (ref 0–200)
Basophils Relative: 1.2 %
Eosinophils Absolute: 50 cells/uL (ref 15–500)
Eosinophils Relative: 1.2 %
HCT: 43.3 % (ref 35.0–45.0)
Hemoglobin: 14.7 g/dL (ref 11.7–15.5)
Lymphs Abs: 844 cells/uL — ABNORMAL LOW (ref 850–3900)
MCH: 33.4 pg — ABNORMAL HIGH (ref 27.0–33.0)
MCHC: 33.9 g/dL (ref 32.0–36.0)
MCV: 98.4 fL (ref 80.0–100.0)
MPV: 9.6 fL (ref 7.5–12.5)
Monocytes Relative: 6.6 %
Neutro Abs: 2978 cells/uL (ref 1500–7800)
Neutrophils Relative %: 70.9 %
Platelets: 288 10*3/uL (ref 140–400)
RBC: 4.4 10*6/uL (ref 3.80–5.10)
RDW: 12.1 % (ref 11.0–15.0)
Total Lymphocyte: 20.1 %
WBC: 4.2 10*3/uL (ref 3.8–10.8)

## 2021-03-27 LAB — MAGNESIUM: Magnesium: 2.3 mg/dL (ref 1.5–2.5)

## 2021-03-27 LAB — TSH: TSH: 0.61 mIU/L (ref 0.40–4.50)

## 2021-03-27 MED ORDER — ROSUVASTATIN CALCIUM 5 MG PO TABS: ORAL_TABLET | ORAL | 3 refills | Status: DC

## 2021-03-27 MED ORDER — WEGOVY 1 MG/0.5ML ~~LOC~~ SOAJ: 1.0000 mg | SUBCUTANEOUS | 0 refills | Status: DC

## 2021-03-28 LAB — URINALYSIS, ROUTINE W REFLEX MICROSCOPIC
Bilirubin Urine: NEGATIVE
Glucose, UA: NEGATIVE
Hgb urine dipstick: NEGATIVE
Ketones, ur: NEGATIVE
Leukocytes,Ua: NEGATIVE
Nitrite: NEGATIVE
Protein, ur: NEGATIVE
Specific Gravity, Urine: 1.01 (ref 1.001–1.035)
pH: <=5 (ref 5.0–8.0)

## 2021-04-02 ENCOUNTER — Encounter: Payer: Self-pay | Admitting: Internal Medicine

## 2021-04-10 DIAGNOSIS — H6981 Other specified disorders of Eustachian tube, right ear: Secondary | ICD-10-CM | POA: Diagnosis not present

## 2021-05-02 ENCOUNTER — Encounter: Payer: Self-pay | Admitting: Adult Health

## 2021-05-03 ENCOUNTER — Other Ambulatory Visit: Payer: Self-pay

## 2021-05-03 ENCOUNTER — Ambulatory Visit (INDEPENDENT_AMBULATORY_CARE_PROVIDER_SITE_OTHER): Payer: Medicare PPO | Admitting: Adult Health

## 2021-05-03 ENCOUNTER — Encounter: Payer: Self-pay | Admitting: Adult Health

## 2021-05-03 VITALS — BP 120/86 | HR 88 | Temp 97.3°F | Wt 212.2 lb

## 2021-05-03 DIAGNOSIS — R002 Palpitations: Secondary | ICD-10-CM

## 2021-05-03 DIAGNOSIS — E039 Hypothyroidism, unspecified: Secondary | ICD-10-CM | POA: Diagnosis not present

## 2021-05-03 LAB — BASIC METABOLIC PANEL WITH GFR
BUN: 20 mg/dL (ref 7–25)
CO2: 30 mmol/L (ref 20–32)
Calcium: 9.7 mg/dL (ref 8.6–10.4)
Chloride: 102 mmol/L (ref 98–110)
Creat: 0.94 mg/dL (ref 0.60–1.00)
Glucose, Bld: 75 mg/dL (ref 65–99)
Potassium: 4.2 mmol/L (ref 3.5–5.3)
Sodium: 139 mmol/L (ref 135–146)
eGFR: 64 mL/min/{1.73_m2} (ref >=60)

## 2021-05-03 LAB — CBC WITH DIFFERENTIAL/PLATELET
Absolute Monocytes: 360 cells/uL (ref 200–950)
Basophils Absolute: 48 cells/uL (ref 0–200)
Basophils Relative: 0.9 %
Eosinophils Absolute: 58 cells/uL (ref 15–500)
Eosinophils Relative: 1.1 %
HCT: 43 % (ref 35.0–45.0)
Hemoglobin: 14.5 g/dL (ref 11.7–15.5)
Lymphs Abs: 1108 cells/uL (ref 850–3900)
MCH: 32.9 pg (ref 27.0–33.0)
MCHC: 33.7 g/dL (ref 32.0–36.0)
MCV: 97.5 fL (ref 80.0–100.0)
MPV: 9.8 fL (ref 7.5–12.5)
Monocytes Relative: 6.8 %
Neutro Abs: 3726 cells/uL (ref 1500–7800)
Neutrophils Relative %: 70.3 %
Platelets: 286 10*3/uL (ref 140–400)
RBC: 4.41 10*6/uL (ref 3.80–5.10)
RDW: 12.2 % (ref 11.0–15.0)
Total Lymphocyte: 20.9 %
WBC: 5.3 10*3/uL (ref 3.8–10.8)

## 2021-05-03 LAB — TSH: TSH: 0.67 mIU/L (ref 0.40–4.50)

## 2021-05-03 MED ORDER — WEGOVY 1.7 MG/0.75ML ~~LOC~~ SOAJ: 1.7000 mg | SUBCUTANEOUS | 2 refills | Status: DC

## 2021-05-03 NOTE — Progress Notes (Signed)
Assessment and Plan:  Kimberly Bowers was seen today for thyroid problem.  Diagnoses and all orders for this visit:  Hypothyroidism, unspecified type continue medications the same pending lab results reminded to take on an empty stomach 30-48mins before food.  check TSH level -     TSH  Morbid obesity (HCC) -     Semaglutide-Weight Management (WEGOVY) 1.7 MG/0.75ML SOAJ; Inject 1.7 mg into the skin once a week.  Palpitations Hx of brief SVT runs per holter in 2021, sx improved today Reduce stress, limit/avoid caffeine, alcohol, prioritize sleep If labs negative monitor and report recurrence of palpitations, consider verapail  Go to the ER if any chest pain, shortness of breath, nausea, dizziness, severe HA, changes vision/speech -     TSH -     CBC with Differential/Platelet -     BASIC METABOLIC PANEL WITH GFR  Further disposition pending results of labs. Discussed med's effects and SE's.   Over 30 minutes of exam, counseling, chart review, and critical decision making was performed.   Future Appointments  Date Time Provider [place]  07/12/2021  9:30 AM Liane Comber, NP [facility] None  11/27/2021  9:00 AM Liane Comber, NP [facility] None    ------------------------------------------------------------------------------------------------------------------   HPI BP 120/86    Pulse 88    Temp (!) 97.3 F (36.3 C)    Wt 212 lb 3.2 oz (96.3 kg)    LMP 03/11/2000    SpO2 99%    BMI 35.31 kg/m  71 y.o.female with hypothyroid on levothyroxine for many years presents requesting thyroid check.   She reports over the last 3 days has had sense of palpitations and frequent skipping beats, difficulty sleeping despite feeling tired (attributes to sense of skipping beats). Denies atypical heat intolerance, diarrhea.   Was concerned about thyroid as has had similar in past that improved with thyroid med adjustment.   Did have holter in 09/2019:   Sinus rhythm Occasional episodes of  supraventricular tachycardia - very brief, 6-9 beat episodes No episodes of atrial fib   She is taking 125 mg whole tab daily except 1/2 tab Sunday, didn't change dose last visit.  Lab Results  Component Value Date   TSH 0.61 03/27/2021   BMI is Body mass index is 35.31 kg/m., she has been working on diet and exercise, has added free weights 3 days a week, walking, recumbent bike use. On wegovy 1 mg/week, has noted reduced appetite and sweets Wt Readings from Last 3 Encounters:  05/03/21 212 lb 3.2 oz (96.3 kg)  03/27/21 213 lb 3.2 oz (96.7 kg)  02/07/21 211 lb (95.7 kg)   Breakfast: old fashioned oatmeal or Kashi high fiber cereal, or avocado toast, or egg on toast Lunch: salad with salmon with basamic vinaigrette, typically without cheese but might do low fat feta Dinner: Protein (chicken, fish), baked or grilled with brown rice and broccoli  Trying to get some fruit - berries, apples  Aiming for 1200-1500 kcal daily    Past Medical History:  Diagnosis Date   Allergy    Anxiety    Arthritis    Diverticulitis 2004   Hypothyroid    Labile hypertension 02/07/2021   Neutropenia ([place])      No Known Allergies  Current Outpatient Medications on File Prior to Visit  Medication Sig   B Complex Vitamins (B COMPLEX PO) Take by mouth.   Cholecalciferol (VITAMIN D) 2000 UNITS tablet Take 2,000 Units by mouth daily.   diclofenac (VOLTAREN) 75 MG EC tablet  Take 1 tablet (75 mg total) by mouth 2 (two) times daily with a meal. As needed for back pain flare.   diclofenac Sodium (VOLTAREN) 1 % GEL Apply 4 grams topically 4 times daily   fexofenadine (ALLEGRA) 60 MG tablet Take 60 mg by mouth daily as needed for allergies or rhinitis.   levothyroxine (SYNTHROID) 125 MCG tablet TAKE 1 TABLET EXCEPT 1/2 TAB ON Sunday BY MOUTH DAILY ON AN EMPTY STOMACH WITH ONLY WATER FOR 30 MINUTES & NO ANTACID MEDS, CALCIUM OR MAGNESIUM FOR 4 HOURS & AVOID BIOTIN   MAGNESIUM PO Take by mouth daily.    Psyllium (METAMUCIL PO) Take by mouth.   rosuvastatin (CRESTOR) 5 MG tablet Take 1 tab daily to reduce risk of heart attack and stroke.   Wheat Dextrin (BENEFIBER) POWD Take by mouth 2 (two) times daily.   No current facility-administered medications on file prior to visit.    ROS: all negative except above.   Physical Exam:  BP 120/86    Pulse 88    Temp (!) 97.3 F (36.3 C)    Wt 212 lb 3.2 oz (96.3 kg)    LMP 03/11/2000    SpO2 99%    BMI 35.31 kg/m   General Appearance: Well nourished, in no apparent distress. Eyes: PERRLA, EOMs, conjunctiva no swelling or erythema Sinuses: No Frontal/maxillary tenderness ENT/Mouth: Ext aud canals clear, TMs without erythema, bulging. No erythema, swelling, or exudate on post pharynx.  Tonsils not swollen or erythematous. Hearing normal.  Neck: Supple, thyroid normal.  Respiratory: Respiratory effort normal, BS equal bilaterally without rales, rhonchi, wheezing or stridor.  Cardio: RRR with no MRGs. Rare skipped beat. Brisk peripheral pulses without edema.  Abdomen: Soft, + BS.  Non tender, no guarding, rebound, hernias, masses. Lymphatics: Non tender without lymphadenopathy.  Musculoskeletal: Full ROM, 5/5 strength, normal gait.  Skin: Warm, dry without rashes, lesions, ecchymosis.  Neuro: Cranial nerves intact. Normal muscle tone, no cerebellar symptoms. Sensation intact.  Psych: Awake and oriented X 3, normal affect, Insight and Judgment appropriate.     Kimberly Ribas, NP 2:32 PM [facility]

## 2021-05-08 ENCOUNTER — Other Ambulatory Visit: Payer: Self-pay | Admitting: Adult Health

## 2021-05-15 ENCOUNTER — Encounter: Payer: Self-pay | Admitting: Adult Health

## 2021-05-15 ENCOUNTER — Other Ambulatory Visit: Payer: Self-pay | Admitting: Adult Health

## 2021-05-15 MED ORDER — VERAPAMIL HCL ER 120 MG PO TBCR: EXTENDED_RELEASE_TABLET | ORAL | 0 refills | Status: DC

## 2021-06-03 ENCOUNTER — Encounter: Payer: Self-pay | Admitting: Adult Health

## 2021-06-05 ENCOUNTER — Other Ambulatory Visit: Payer: Self-pay | Admitting: Adult Health

## 2021-06-05 MED ORDER — WEGOVY 1.7 MG/0.75ML ~~LOC~~ SOAJ: 1.7000 mg | SUBCUTANEOUS | 2 refills | Status: DC

## 2021-06-19 ENCOUNTER — Other Ambulatory Visit: Payer: Self-pay | Admitting: *Deleted

## 2021-06-19 DIAGNOSIS — I7121 Aneurysm of the ascending aorta, without rupture: Secondary | ICD-10-CM

## 2021-07-01 ENCOUNTER — Encounter: Payer: Self-pay | Admitting: Adult Health

## 2021-07-02 ENCOUNTER — Other Ambulatory Visit: Payer: Self-pay | Admitting: Nurse Practitioner

## 2021-07-02 MED ORDER — WEGOVY 2.4 MG/0.75ML ~~LOC~~ SOAJ
2.4000 mg | SUBCUTANEOUS | 2 refills | Status: DC
  Filled 2021-09-04: qty 3, 28d supply, fill #0

## 2021-07-12 ENCOUNTER — Encounter: Payer: Self-pay | Admitting: Adult Health

## 2021-07-12 ENCOUNTER — Ambulatory Visit: Payer: Medicare PPO | Admitting: Adult Health

## 2021-07-12 VITALS — BP 118/70 | HR 92 | Temp 97.9°F | Resp 16 | Ht 65.0 in | Wt 213.0 lb

## 2021-07-12 DIAGNOSIS — R6889 Other general symptoms and signs: Secondary | ICD-10-CM

## 2021-07-12 DIAGNOSIS — E039 Hypothyroidism, unspecified: Secondary | ICD-10-CM | POA: Diagnosis not present

## 2021-07-12 DIAGNOSIS — M1712 Unilateral primary osteoarthritis, left knee: Secondary | ICD-10-CM | POA: Diagnosis not present

## 2021-07-12 DIAGNOSIS — K8020 Calculus of gallbladder without cholecystitis without obstruction: Secondary | ICD-10-CM | POA: Diagnosis not present

## 2021-07-12 DIAGNOSIS — E782 Mixed hyperlipidemia: Secondary | ICD-10-CM

## 2021-07-12 DIAGNOSIS — Z0001 Encounter for general adult medical examination with abnormal findings: Secondary | ICD-10-CM | POA: Diagnosis not present

## 2021-07-12 DIAGNOSIS — F419 Anxiety disorder, unspecified: Secondary | ICD-10-CM

## 2021-07-12 DIAGNOSIS — I77819 Aortic ectasia, unspecified site: Secondary | ICD-10-CM

## 2021-07-12 DIAGNOSIS — N182 Chronic kidney disease, stage 2 (mild): Secondary | ICD-10-CM | POA: Diagnosis not present

## 2021-07-12 DIAGNOSIS — Z0000 Encounter for general adult medical examination without abnormal findings: Secondary | ICD-10-CM

## 2021-07-12 DIAGNOSIS — E559 Vitamin D deficiency, unspecified: Secondary | ICD-10-CM

## 2021-07-12 DIAGNOSIS — Z79899 Other long term (current) drug therapy: Secondary | ICD-10-CM | POA: Diagnosis not present

## 2021-07-12 DIAGNOSIS — R0989 Other specified symptoms and signs involving the circulatory and respiratory systems: Secondary | ICD-10-CM | POA: Diagnosis not present

## 2021-07-12 NOTE — Progress Notes (Signed)
AWV AND FOLLOW UP ? ?Assessment:  ? ?Annual Medicare Wellness Visit ?Due annually  ?Health maintenance reviewed ? ?Aortic dilatation (HCC) ?See on Echo 2021, interval enlargement to 41 mm in 2022 and now follows with CV surgery  ?Control BP  ? ?Morbid obesity ([place]) - BMI 35 + hld, knee arthritis ?Lack of benefit with wellbutrin, naltrexone, phentermine ?On wegovy but not seeing much benefit - ? If not administering med correctly, will set up NV next week for next injection for technique review ?Discussed protein and resistance exercises ?Keep a food log for review at next visit  ?Close follow up 3-4 months ? ?Mixed hyperlipidemia ?-     Lipid panel ?check lipids ?decrease fatty foods ?increase activity.  ? ?CKD III ?Increase fluids, avoid NSAIDS, monitor sugars, will monitor ?- CMP/GFR ? ?Medication management ?-     CBC with Differential/Platelet ?-     COMPLETE METABOLIC PANEL WITH GFR ?-     Magnesium ? ?Vitamin D deficiency ?-     VITAMIN D 25 Hydroxy (Vit-D Deficiency, Fractures) ? ?Hypothyroidism, unspecified type ?-     TSH ?Hypothyroidism-check TSH level, continue medications the same, reminded to take on an empty stomach 30-67mins before food.  ?Feeling better on the 125 mcg- will recheck ? ?Allergic state, subsequent encounter ?Continue to monitor ? ?Anxiety ?Monitor ? ?Orders Placed This Encounter  ?Procedures  ? CBC with Differential/Platelet  ? COMPLETE METABOLIC PANEL WITH GFR  ? Magnesium  ? Lipid panel  ? TSH  ? ? ? ? ?Future Appointments  ?Date Time Provider [place]  ?07/19/2021  9:40 AM [facility] 1 [facility]  ?07/24/2021  1:00 PM Melrose Nakayama, MD [facility]  ?11/27/2021  9:00 AM Liane Comber, NP [facility] None  ?07/15/2022  9:00 AM Mull, Townsend Roger, NP [facility] None  ? ? ?Plan:  ? ?During the course of the visit the patient was educated and counseled about appropriate screening and preventive services including:  ? ?Pneumococcal vaccine  ?Prevnar 13 ?Influenza  vaccine ?Td vaccine ?Screening electrocardiogram ?Bone densitometry screening ?Colorectal cancer screening ?Diabetes screening ?Glaucoma screening ?Nutrition counseling  ?Advanced directives: requested ? ? ?Subjective:  ? ?Kimberly Bowers is a 71 y.o. female who presents for AWV and 3 month follow up on hypertension, hypothyroidism, obesity, hyperlipidemia, vitamin D def. She has Anxiety; Hypothyroid; Allergy; Mixed hyperlipidemia; Morbid obesity ([place]); Vitamin D deficiency; Medication management; Aortic dilatation ([place]); Gallstones; Arthritis of left knee; Labile hypertension; and CKD (chronic kidney disease) stage 2, GFR 60-89 ml/min on their problem list. ? ?Left knee arthritis, intermittent back pain, follows with [facility] - Dr. Layne Benton,  has cortisone shots and doing strengthening exercises.  ? ?Has seen ENT for eustachian tube dysfunction.  ? ?BMI is Body mass index is 35.45 kg/m?., she is working on diet and exercise, walking, doing free weights to failure and recumbent bike.  ?She failed lifestyle, appetite suppressants, now on wegovy 2.4 mg, has noted reduces cravings, but unfortunately not seeing much weight loss so far, though does note some questionable benefit at this dose. She has noted dripping from needle after injection, ? If getting med ?Following mediterranean diet, doing 1200 cal daily  ?Wt Readings from Last 3 Encounters:  ?07/12/21 213 lb (96.6 kg)  ?05/03/21 212 lb 3.2 oz (96.3 kg)  ?03/27/21 213 lb 3.2 oz (96.7 kg)  ? ?She had ECHO 09/2019 with EF 80-32%, grade 1 dystolic dysfunction, trivial MVR, did show mild ascending aortic dilation of 38 mm, follow up ECHO  12/26/2020 showed dilation of 41 mm and was referred to CV surgery, they are planning 6 month follow up for CTA, scheduled 07/19/2021, will see cardiologist the week following (Dr. Roxan Hockey).  ? ?Her blood pressure has been controlled at home, today their BP is BP: 118/70 ?She does workout, she is walking daily, doing free weights.   ?She denies chest pain, shortness of breath, dizziness.  ? ?She is not on cholesterol medication and denies myalgias. Her cholesterol is not at goal. The cholesterol last visit was:   ?Lab Results  ?Component Value Date  ? CHOL 221 (H) 03/27/2021  ? HDL 79 03/27/2021  ? LDLCALC 124 (H) 03/27/2021  ? TRIG 84 03/27/2021  ? CHOLHDL 2.8 03/27/2021  ? ?Last A1C in the office was:  ?Lab Results  ?Component Value Date  ? HGBA1C 4.8 11/24/2020  ? ?She has CKD II/III, recently improved, monitored at this office; normal renal ultrasound in 11/2019, did incidentally show gallstones, has stopped diclofenac oral, takes rarely PRN ?Lab Results  ?Component Value Date  ? EGFR 64 05/03/2021  ? EGFR 63 03/27/2021  ? EGFR 65 11/24/2020  ? ?Lab Results  ?Component Value Date  ? MICROALBUR 1.6 11/24/2020  ? MICROALBUR 2.9 11/25/2019  ? ?Patient is on Vitamin D supplement, 2000 IU daily.  ?Lab Results  ?Component Value Date  ? VD25OH 49 07/12/2020  ?   ?She is on thyroid medication. Her medication was not changed last visit. She is on 11mg every day except 1/2 tab on Sunday..  She is not on a biotin.  ?Lab Results  ?Component Value Date  ? TSH 0.67 05/03/2021  ? ? ? ?Medication Review ? ?Current Outpatient Medications (Endocrine & Metabolic):  ?  levothyroxine (SYNTHROID) 125 MCG tablet, TAKE ONE TABLET BY MOUTH ONE TIME DAILY on empty stomach with only water for 30 minutes *no antacids, calcium, magnesium for 4 hours & avoid biotin* ? ?Current Outpatient Medications (Cardiovascular):  ?  rosuvastatin (CRESTOR) 5 MG tablet, Take 1 tab daily to reduce risk of heart attack and stroke. ? ?Current Outpatient Medications (Respiratory):  ?  fexofenadine (ALLEGRA) 60 MG tablet, Take 60 mg by mouth daily as needed for allergies or rhinitis. ? ?Current Outpatient Medications (Analgesics):  ?  diclofenac (VOLTAREN) 75 MG EC tablet, Take 1 tablet (75 mg total) by mouth 2 (two) times daily with a meal. As needed for back pain flare. ? ? ?Current  Outpatient Medications (Other):  ?  B Complex Vitamins (B COMPLEX PO), Take by mouth. ?  Cholecalciferol (VITAMIN D) 2000 UNITS tablet, Take 2,000 Units by mouth daily. ?  diclofenac Sodium (VOLTAREN) 1 % GEL, Apply 4 grams topically 4 times daily ?  MAGNESIUM PO, Take by mouth daily. ?  Psyllium (METAMUCIL PO), Take by mouth. ?  Semaglutide-Weight Management (WEGOVY) 2.4 MG/0.75ML SOAJ, Inject 2.4 mg into the skin once a week. ?  Wheat Dextrin (BENEFIBER) POWD, Take by mouth 2 (two) times daily. ? ?Current Problems (verified) ?Patient Active Problem List  ? Diagnosis Date Noted  ? CKD (chronic kidney disease) stage 2, GFR 60-89 ml/min 07/12/2021  ? Labile hypertension 02/07/2021  ? Arthritis of left knee 07/12/2020  ? Gallstones 01/10/2020  ? Aortic dilatation ([place] 10/13/2019  ? Morbid obesity ([place] 03/17/2014  ? Vitamin D deficiency 03/17/2014  ? Medication management 03/17/2014  ? Mixed hyperlipidemia 03/25/2013  ? Anxiety   ? Hypothyroid   ? Allergy   ? ? ?Screening Tests ?Immunization History  ?Administered Date(s) Administered  ?  Influenza Split 12/08/2013  ? Influenza, High Dose Seasonal PF 11/19/2014, 11/01/2016, 12/19/2017, 11/18/2018  ? Influenza-Unspecified 12/29/2012, 12/19/2017, 11/09/2019, 12/08/2020  ? PFIZER Comirnaty(Gray Top)Covid-19 Tri-Sucrose Vaccine 07/08/2021  ? PFIZER(Purple Top)SARS-COV-2 Vaccination 04/04/2019, 04/23/2019, 11/09/2019, 06/29/2020, 11/14/2020  ? Pneumococcal Conjugate-13 09/05/2014  ? Pneumococcal Polysaccharide-23 11/02/2015  ? Td 03/11/2004  ? Tdap 07/08/2012  ? Zoster Recombinat (Shingrix) 01/04/2019, 03/09/2019  ? Zoster, Live 07/08/2012  ? ?Health Maintenance  ?Topic Date Due  ? INFLUENZA VACCINE  10/09/2021  ? URINE MICROALBUMIN  11/24/2021  ? MAMMOGRAM  03/15/2022  ? TETANUS/TDAP  07/09/2022  ? COLONOSCOPY (Pts 45-29yr Insurance coverage will need to be confirmed)  11/28/2022  ? Pneumonia Vaccine 71 Years old  Completed  ? DEXA SCAN  Completed  ? COVID-19 Vaccine   Completed  ? Hepatitis C Screening  Completed  ? Zoster Vaccines- Shingrix  Completed  ? HPV VACCINES  Aged Out  ? ?Last colonoscopy: 11/2017 Dr. NSilverio Decamp polyps, 5 year recall  ? ?Last mammogram: 03/15/2021

## 2021-07-13 LAB — LIPID PANEL
Cholesterol: 175 mg/dL (ref <200)
HDL: 78 mg/dL (ref >=50)
LDL Cholesterol (Calc): 80 mg/dL (calc)
Non-HDL Cholesterol (Calc): 97 mg/dL (calc) (ref <130)
Total CHOL/HDL Ratio: 2.2 (calc) (ref <5.0)
Triglycerides: 88 mg/dL (ref <150)

## 2021-07-13 LAB — CBC WITH DIFFERENTIAL/PLATELET
Absolute Monocytes: 373 cells/uL (ref 200–950)
Basophils Absolute: 38 cells/uL (ref 0–200)
Basophils Relative: 0.7 %
Eosinophils Absolute: 92 cells/uL (ref 15–500)
Eosinophils Relative: 1.7 %
HCT: 43.2 % (ref 35.0–45.0)
Hemoglobin: 14.5 g/dL (ref 11.7–15.5)
Lymphs Abs: 1102 cells/uL (ref 850–3900)
MCH: 33 pg (ref 27.0–33.0)
MCHC: 33.6 g/dL (ref 32.0–36.0)
MCV: 98.2 fL (ref 80.0–100.0)
MPV: 9.8 fL (ref 7.5–12.5)
Monocytes Relative: 6.9 %
Neutro Abs: 3796 cells/uL (ref 1500–7800)
Neutrophils Relative %: 70.3 %
Platelets: 278 10*3/uL (ref 140–400)
RBC: 4.4 10*6/uL (ref 3.80–5.10)
RDW: 12.2 % (ref 11.0–15.0)
Total Lymphocyte: 20.4 %
WBC: 5.4 10*3/uL (ref 3.8–10.8)

## 2021-07-13 LAB — COMPLETE METABOLIC PANEL WITH GFR
AG Ratio: 2.1 (calc) (ref 1.0–2.5)
ALT: 16 U/L (ref 6–29)
AST: 22 U/L (ref 10–35)
Albumin: 4.6 g/dL (ref 3.6–5.1)
Alkaline phosphatase (APISO): 55 U/L (ref 37–153)
BUN: 18 mg/dL (ref 7–25)
CO2: 25 mmol/L (ref 20–32)
Calcium: 9.5 mg/dL (ref 8.6–10.4)
Chloride: 104 mmol/L (ref 98–110)
Creat: 0.98 mg/dL (ref 0.60–1.00)
Globulin: 2.2 g/dL (calc) (ref 1.9–3.7)
Glucose, Bld: 91 mg/dL (ref 65–99)
Potassium: 4.2 mmol/L (ref 3.5–5.3)
Sodium: 139 mmol/L (ref 135–146)
Total Bilirubin: 0.7 mg/dL (ref 0.2–1.2)
Total Protein: 6.8 g/dL (ref 6.1–8.1)
eGFR: 61 mL/min/{1.73_m2} (ref >=60)

## 2021-07-13 LAB — TSH: TSH: 0.67 mIU/L (ref 0.40–4.50)

## 2021-07-13 LAB — MAGNESIUM: Magnesium: 2.4 mg/dL (ref 1.5–2.5)

## 2021-07-17 ENCOUNTER — Ambulatory Visit: Payer: Medicare PPO

## 2021-07-19 ENCOUNTER — Ambulatory Visit
Admission: RE | Admit: 2021-07-19 | Discharge: 2021-07-19 | Disposition: A | Discharge status: Home / Self Care | Payer: Medicare PPO | Source: Ambulatory Visit | Attending: Thoracic Surgery (Cardiothoracic Vascular Surgery) | Admitting: Thoracic Surgery (Cardiothoracic Vascular Surgery)

## 2021-07-19 ENCOUNTER — Other Ambulatory Visit: Payer: Self-pay | Admitting: Adult Health

## 2021-07-19 DIAGNOSIS — I712 Thoracic aortic aneurysm, without rupture, unspecified: Secondary | ICD-10-CM | POA: Diagnosis not present

## 2021-07-19 DIAGNOSIS — I7121 Aneurysm of the ascending aorta, without rupture: Secondary | ICD-10-CM

## 2021-07-19 DIAGNOSIS — M25562 Pain in left knee: Secondary | ICD-10-CM

## 2021-07-19 DIAGNOSIS — M47814 Spondylosis without myelopathy or radiculopathy, thoracic region: Secondary | ICD-10-CM | POA: Diagnosis not present

## 2021-07-19 DIAGNOSIS — K7689 Other specified diseases of liver: Secondary | ICD-10-CM | POA: Diagnosis not present

## 2021-07-19 MED ORDER — IOPAMIDOL (ISOVUE-370) INJECTION 76%
75.0000 mL | Freq: Once | INTRAVENOUS | Status: AC | PRN
  Administered 2021-07-19: 75 mL via INTRAVENOUS

## 2021-07-24 ENCOUNTER — Ambulatory Visit: Payer: Medicare PPO

## 2021-07-24 ENCOUNTER — Encounter: Payer: Self-pay | Admitting: Thoracic Surgery (Cardiothoracic Vascular Surgery)

## 2021-07-24 ENCOUNTER — Ambulatory Visit: Payer: Medicare PPO | Admitting: Thoracic Surgery (Cardiothoracic Vascular Surgery)

## 2021-07-24 VITALS — BP 127/84 | HR 100 | Resp 20 | Ht 65.0 in | Wt 212.0 lb

## 2021-07-24 DIAGNOSIS — I7121 Aneurysm of the ascending aorta, without rupture: Secondary | ICD-10-CM

## 2021-07-24 DIAGNOSIS — Z79899 Other long term (current) drug therapy: Secondary | ICD-10-CM | POA: Diagnosis not present

## 2021-07-24 NOTE — Progress Notes (Signed)
Patient presents to the office for a nurse visit for assistance and instruction on administering a Wegovy injection. Injection given without any concerns.  ?

## 2021-08-03 ENCOUNTER — Other Ambulatory Visit: Payer: Self-pay | Admitting: Nurse Practitioner

## 2021-09-03 ENCOUNTER — Encounter: Payer: Self-pay | Admitting: Adult Health

## 2021-09-04 ENCOUNTER — Other Ambulatory Visit (HOSPITAL_COMMUNITY): Payer: Self-pay

## 2021-09-24 ENCOUNTER — Other Ambulatory Visit (HOSPITAL_COMMUNITY): Payer: Self-pay

## 2021-09-24 ENCOUNTER — Other Ambulatory Visit: Payer: Self-pay | Admitting: Nurse Practitioner

## 2021-09-24 MED ORDER — WEGOVY 2.4 MG/0.75ML ~~LOC~~ SOAJ
2.4000 mg | SUBCUTANEOUS | 2 refills | Status: DC
  Filled 2021-09-24: qty 3, 28d supply, fill #0
  Filled 2021-10-25: qty 3, 28d supply, fill #1
  Filled 2021-11-20: qty 3, 28d supply, fill #2

## 2021-09-25 ENCOUNTER — Other Ambulatory Visit (HOSPITAL_COMMUNITY): Payer: Self-pay

## 2021-10-25 ENCOUNTER — Other Ambulatory Visit (HOSPITAL_COMMUNITY): Payer: Self-pay

## 2021-10-29 ENCOUNTER — Other Ambulatory Visit (HOSPITAL_COMMUNITY): Payer: Self-pay

## 2021-11-20 ENCOUNTER — Other Ambulatory Visit (HOSPITAL_COMMUNITY): Payer: Self-pay

## 2021-11-27 ENCOUNTER — Encounter: Payer: Medicare PPO | Admitting: Adult Health

## 2021-11-27 NOTE — Progress Notes (Unsigned)
CPE AND FOLLOW UP  Assessment:   Encounter for Annual Physical Exam with abnormal findings Due annually  Health Maintenance reviewed Healthy lifestyle reviewed and goals set  Aortic dilatation ([place]) See on Echo 2021  Control BP  ECHO follow up due this year, discussed and order placed  Morbid obesity ([place]) - BMI 35 + hld, knee arthritis Lack of benefit with wellbutrin, naltrexone, phentermine Discussed diet, will restart intermittent fasting, will increase fiber and protein. High fiber diet handout given, goals set for exercise Close follow up 3-4 months  Mixed hyperlipidemia -     Lipid panel check lipids decrease fatty foods increase activity.   CKD 2 Increase fluids, avoid NSAIDS, monitor sugars, will monitor - CMP/GFR, UA, microalbumin  Medication management -     CBC with Differential/Platelet -     COMPLETE METABOLIC PANEL WITH GFR -     Magnesium  Abnormal Glucose Continue diet and exercise - A1c  Vitamin D deficiency -     VITAMIN D 25 Hydroxy (Vit-D Deficiency, Fractures)  Hypothyroidism, unspecified type -     TSH Hypothyroidism-check TSH level, continue medications the same, reminded to take on an empty stomach 30-58mns before food.  Feeling better on the 125 mcg- will recheck  Allergic state, subsequent encounter Continue to monitor  Anxiety Monitor  Systolic murmur No symptoms recently Sister with history of mitral prolapse Repeat ECHO due to aortic dilation  Screening for ischemic heart disease - EKG  Screening for AAA - U/S ABD retroperitoneal, LTD  Screening for hematuria/proteinuria - UA with reflex microcopic - Microalbumin/creatinine urine ratio     Future Appointments  Date Time Provider DLake Lindsey 11/28/2021 10:00 AM WAlycia Rossetti NP [facility] None  07/15/2022  9:00 AM WAlycia Rossetti NP [facility] None     Subjective:   AYOCHEVED DEPNERis a 71y.o. female who presents for CPE and 3 month follow up on  hypertension, hypothyroidism, obesity, hyperlipidemia, vitamin D def. She has Anxiety; Hypothyroid; Allergy; Mixed hyperlipidemia; Morbid [place] ([place]; Vitamin D deficiency; Medication management; [place] dil[place]; Gallstones; Arthritis of left knee; Labile hypertension; and CKD (chronic kidney disease) stage 2, GFR 60-89 ml/min on their problem list.  She is married, 2 grown sons, 2 grandchildren. Retired dScientist, research (medical)   Left knee arthritis, intermittent back pain, follows with [facility] - Dr. BLayne Benton  has cortisone shots and doing strengthening exercises.   BMI is There is no height or weight on file to calculate BMI., she is working on diet and exercise, walking, doing free weights and recumbent bike.  She started on wellbutrin and naltrexone, hasn't noted benefit and stopped, Wellbutrin caused some sleep problems. Didn't tolerate phentermine remotely.  Following mediterranean diet.  Wt Readings from Last 3 Encounters:  07/24/21 212 lb (96.2 kg)  07/12/21 213 lb (96.6 kg)  05/03/21 212 lb 3.2 oz (96.3 kg)   She had ECHO 09/2019 with EF 557-84% grade 1 dystolic dysfunction, trivial MVR, did show mild ascending aortic dilation of 38 mm, planning follow up ECHO this year.   Her blood pressure has been controlled at home, today their BP is   She does workout, she is walking daily, doing free weights.  She denies chest pain, shortness of breath, dizziness.   She is not on cholesterol medication and denies myalgias. Her cholesterol is not at goal. The cholesterol last visit was:   Lab Results  Component Value Date   CHOL 175 07/12/2021   HDL 78 07/12/2021  LDLCALC 80 07/12/2021   TRIG 88 07/12/2021   CHOLHDL 2.2 07/12/2021   Last A1C in the office was:  Lab Results  Component Value Date   HGBA1C 4.8 11/24/2020   She has CKD II/III, stable, monitored at this office; normal renal ultrasound in 11/2019, did incidentally show gallstones, has stopped diclofenac oral, takes  rarely PRN Lab Results  Component Value Date   GFRNONAA 63 07/12/2020   GFRNONAA 59 (L) 02/10/2020   GFRNONAA 59 (L) 01/10/2020   Lab Results  Component Value Date   MICROALBUR 1.6 11/24/2020   MICROALBUR 2.9 11/25/2019   Patient is on Vitamin D supplement, 2000 IU daily.  Lab Results  Component Value Date   VD25OH 49 07/12/2020     She is on thyroid medication. Her medication was not changed last visit. She is on 123mg every day except 1/2 tab on Sunday..  She is not on a biotin.  Lab Results  Component Value Date   TSH 0.67 07/12/2021      Medication Review  Current Outpatient Medications (Endocrine & Metabolic):    levothyroxine (SYNTHROID) 125 MCG tablet, TAKE ONE TABLET BY MOUTH ONE TIME DAILY ON EMPTY STOMACH WITH ONLY WATER FOR 30 MIN. NO ANTACIDS, CALCIUM, MAGNESIUM FOR 4 HOURS AND AVOID BIOTIN  Current Outpatient Medications (Cardiovascular):    rosuvastatin (CRESTOR) 5 MG tablet, Take 1 tab daily to reduce risk of heart attack and stroke.  Current Outpatient Medications (Respiratory):    fexofenadine (ALLEGRA) 60 MG tablet, Take 60 mg by mouth daily as needed for allergies or rhinitis.  Current Outpatient Medications (Analgesics):    diclofenac (VOLTAREN) 75 MG EC tablet, Take  1 tablet  2 x/day  with Meals  for Pain & Inflammation   Current Outpatient Medications (Other):    B Complex Vitamins (B COMPLEX PO), Take by mouth.   Cholecalciferol (VITAMIN D) 2000 UNITS tablet, Take 2,000 Units by mouth daily.   diclofenac Sodium (VOLTAREN) 1 % GEL, Apply 4 grams topically 4 times daily   MAGNESIUM PO, Take by mouth daily.   Psyllium (METAMUCIL PO), Take by mouth.   Semaglutide-Weight Management (WEGOVY) 2.4 MG/0.75ML SOAJ, Inject 2.4 mg into the skin once a week.   Wheat Dextrin (BENEFIBER) POWD, Take by mouth 2 (two) times daily.  Current Problems (verified) Patient Active Problem List   Diagnosis Date Noted   CKD (chronic kidney disease) stage 2, GFR 60-89  ml/min 07/12/2021   Labile hypertension 02/07/2021   Arthritis of left knee 07/12/2020   Gallstones 01/10/2020   Aortic dilatation ([place] 10/13/2019   Morbid obesity ([place] 03/17/2014   Vitamin D deficiency 03/17/2014   Medication management 03/17/2014   Mixed hyperlipidemia 03/25/2013   Anxiety    Hypothyroid    Allergy     Screening Tests Immunization History  Administered Date(s) Administered   Influenza Split 12/08/2013   Influenza, High Dose Seasonal PF 11/19/2014, 11/01/2016, 12/19/2017, 11/18/2018   Influenza-Unspecified 12/29/2012, 12/19/2017, 11/09/2019, 12/08/2020   PFIZER Comirnaty(Gray Top)Covid-19 Tri-Sucrose Vaccine 07/08/2021   PFIZER(Purple Top)SARS-COV-2 Vaccination 04/04/2019, 04/23/2019, 11/09/2019, 06/29/2020, 11/14/2020   Pneumococcal Conjugate-13 09/05/2014   Pneumococcal Polysaccharide-23 11/02/2015   Td 03/11/2004   Tdap 07/08/2012   Zoster Recombinat (Shingrix) 01/04/2019, 03/09/2019   Zoster, Live 07/08/2012    Preventative care:  Tetanus: 2014 Pneumonia: 2/2 Influenza: 10/2019 Encouraged in Oct  Zoster: 2014 Shingrix: Had at [facility] - dates requested Covid 19: 4/4, last 06/29/2020  Last colonoscopy: 11/2017 Dr. NSilverio Decamp polyps, 5 year recall  EGD: 2009  Last mammogram: 01/2020- has upcoming  12/2017- had left BENIGN breast biopsy  Last pap smear/pelvic exam: 07/2015, DONE DEXA:04/2015 -0.6 normal  Names of Other Physician/Practitioners you currently use: 1. [facility]- here for primary care 2. Dr. [place], eye doctor, last visit 2021, has scheduled next week  3. Dr. Orvil Feil, dentist, last visit 2022, q6 months 4. Dr. ? - Dr. Vertell [facility]ber practice, derm, has upcoming scheduled 12/2020, goes annually   Patient Care Team: Unk Pinto, MD as PCP - General (Internal Medicine) Sable Feil, MD as Consulting Physician (Gastroenterology) Elsie Saas, MD as Consulting Physician (Orthopedic  Surgery) Megan Salon, MD as Consulting Physician (Gynecology)   Allergies Allergies  Allergen Reactions   Quinolones     Aortic aneurysm    SURGICAL HISTORY She  has a past surgical history that includes Laparoscopic gastric banding (2004); Knee arthroscopy (Left); Uterine fibroid surgery (2001); Foot surgery (Right); Tubal ligation; Laparoscopic repair and removal of gastric band; Brachioplasty (03/2016); Cataract extraction (Bilateral, 2019); and Breast biopsy (Left, 01/2018). FAMILY HISTORY Her family history includes Cancer in her mother; Diabetes in her father; Emphysema in her father; Melanoma in her brother. SOCIAL HISTORY She  reports that she has never smoked. She has never used smokeless tobacco. She reports current alcohol use of about 1.0 - 2.0 standard drink of alcohol per week. She reports that she does not use drugs.   Review of Systems:  Review of Systems  Constitutional: Negative.  Negative for malaise/fatigue and weight loss.  HENT: Negative.  Negative for hearing loss and tinnitus.   Eyes: Negative.  Negative for blurred vision and double vision.  Respiratory: Negative.  Negative for cough, sputum production, shortness of breath and wheezing.   Cardiovascular: Negative.  Negative for chest pain, palpitations, orthopnea, claudication, leg swelling and PND.  Gastrointestinal: Negative.  Negative for abdominal pain, blood in stool, constipation, diarrhea, heartburn, melena, nausea and vomiting.  Genitourinary: Negative.   Musculoskeletal:  Positive for joint pain (left knee). Negative for falls and myalgias.  Skin: Negative.  Negative for rash.  Neurological: Negative.  Negative for dizziness, tingling, sensory change, weakness and headaches.  Endo/Heme/Allergies: Negative.  Negative for polydipsia.  Psychiatric/Behavioral: Negative.  Negative for depression, memory loss, substance abuse and suicidal ideas. The patient is not nervous/anxious and does not have  insomnia.   All other systems reviewed and are negative.   Objective:   There were no vitals filed for this visit.   General appearance: alert, no distress, WD/WN,  female HEENT: normocephalic, sclerae anicteric, TMs pearly, nares patent, no discharge or erythema, pharynx normal Oral cavity: MMM, no lesions Neck: supple, no lymphadenopathy, no thyromegaly, no masses Heart: RRR, normal S1, S2, no radiation to carotids Lungs: CTA bilaterally, no wheezes, rhonchi, or rales Abdomen: +bs, soft, non tender, non distended, no masses, no hepatomegaly, no splenomegaly Musculoskeletal: nontender, no swelling, than 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  Extremities: no edema, no cyanosis, no clubbing Pulses: 2+ symmetric, upper and lower extremities, normal cap refill Neurological: alert, oriented x 3, CN2-12 intact, strength normal upper extremities and lower extremities, sensation normal throughout, DTRs 2+ throughout, no cerebellar signs, gait antalgic Psychiatric: normal affect, behavior normal, pleasant  Breasts: getting annual mammograms, denies concerns GU: defer  EKG: NSR, NSCPT   Manika Hast Mikki Santee, NP 9:40 AM [facility]

## 2021-11-28 ENCOUNTER — Encounter: Payer: Self-pay | Admitting: Nurse Practitioner

## 2021-11-28 ENCOUNTER — Ambulatory Visit (INDEPENDENT_AMBULATORY_CARE_PROVIDER_SITE_OTHER): Payer: Medicare PPO | Admitting: Nurse Practitioner

## 2021-11-28 VITALS — BP 108/80 | HR 91 | Temp 97.3°F | Ht 65.25 in | Wt 197.0 lb

## 2021-11-28 DIAGNOSIS — R7309 Other abnormal glucose: Secondary | ICD-10-CM

## 2021-11-28 DIAGNOSIS — E782 Mixed hyperlipidemia: Secondary | ICD-10-CM

## 2021-11-28 DIAGNOSIS — H6981 Other specified disorders of Eustachian tube, right ear: Secondary | ICD-10-CM

## 2021-11-28 DIAGNOSIS — Z79899 Other long term (current) drug therapy: Secondary | ICD-10-CM | POA: Diagnosis not present

## 2021-11-28 DIAGNOSIS — Z0000 Encounter for general adult medical examination without abnormal findings: Secondary | ICD-10-CM | POA: Diagnosis not present

## 2021-11-28 DIAGNOSIS — I77819 Aortic ectasia, unspecified site: Secondary | ICD-10-CM

## 2021-11-28 DIAGNOSIS — E039 Hypothyroidism, unspecified: Secondary | ICD-10-CM

## 2021-11-28 DIAGNOSIS — N182 Chronic kidney disease, stage 2 (mild): Secondary | ICD-10-CM

## 2021-11-28 DIAGNOSIS — Z136 Encounter for screening for cardiovascular disorders: Secondary | ICD-10-CM

## 2021-11-28 DIAGNOSIS — Z1389 Encounter for screening for other disorder: Secondary | ICD-10-CM | POA: Diagnosis not present

## 2021-11-28 DIAGNOSIS — E6609 Other obesity due to excess calories: Secondary | ICD-10-CM

## 2021-11-28 DIAGNOSIS — R011 Cardiac murmur, unspecified: Secondary | ICD-10-CM

## 2021-11-28 DIAGNOSIS — R0989 Other specified symptoms and signs involving the circulatory and respiratory systems: Secondary | ICD-10-CM

## 2021-11-28 DIAGNOSIS — F419 Anxiety disorder, unspecified: Secondary | ICD-10-CM

## 2021-11-28 DIAGNOSIS — E559 Vitamin D deficiency, unspecified: Secondary | ICD-10-CM | POA: Diagnosis not present

## 2021-11-28 DIAGNOSIS — T7840XD Allergy, unspecified, subsequent encounter: Secondary | ICD-10-CM

## 2021-11-28 DIAGNOSIS — Z0001 Encounter for general adult medical examination with abnormal findings: Secondary | ICD-10-CM

## 2021-11-28 NOTE — Patient Instructions (Signed)
Mucinex daily x 7 days and monitor right ear fullness for improvement  Eustachian Tube Dysfunction  Eustachian tube dysfunction refers to a condition in which a blockage develops in the narrow passage that connects the middle ear to the back of the nose (eustachian tube). The eustachian tube regulates air pressure in the middle ear by letting air move between the ear and nose. It also helps to drain fluid from the middle ear space. Eustachian tube dysfunction can affect one or both ears. When the eustachian tube does not function properly, air pressure, fluid, or both can build up in the middle ear. What are the causes? This condition occurs when the eustachian tube becomes blocked or cannot open normally. Common causes of this condition include: Ear infections. Colds and other infections that affect the nose, mouth, and throat (upper respiratory tract). Allergies. Irritation from cigarette smoke. Irritation from stomach acid coming up into the esophagus (gastroesophageal reflux). The esophagus is the part of the body that moves food from the mouth to the stomach. Sudden changes in air pressure, such as from descending in an airplane or scuba diving. Abnormal growths in the nose or throat, such as: Growths that line the nose (nasal polyps). Abnormal growth of cells (tumors). Enlarged tissue at the back of the throat (adenoids). What increases the risk? You are more likely to develop this condition if: You smoke. You are overweight. You are a child who has: Certain birth defects of the mouth, such as cleft palate. Large tonsils or adenoids. What are the signs or symptoms? Common symptoms of this condition include: A feeling of fullness in the ear. Ear pain. Clicking or popping noises in the ear. Ringing in the ear (tinnitus). Hearing loss. Loss of balance. Dizziness. Symptoms may get worse when the air pressure around you changes, such as when you travel to an area of high elevation,  fly on an airplane, or go scuba diving. How is this diagnosed? This condition may be diagnosed based on: Your symptoms. A physical exam of your ears, nose, and throat. Tests, such as those that measure: The movement of your eardrum. Your hearing (audiometry). How is this treated? Treatment depends on the cause and severity of your condition. In mild cases, you may relieve your symptoms by moving air into your ears. This is called "popping the ears." In more severe cases, or if you have symptoms of fluid in your ears, treatment may include: Medicines to relieve congestion (decongestants). Medicines that treat allergies (antihistamines). Nasal sprays or ear drops that contain medicines that reduce swelling (steroids). A procedure to drain the fluid in your eardrum. In this procedure, a small tube may be placed in the eardrum to: Drain the fluid. Restore the air in the middle ear space. A procedure to insert a balloon device through the nose to inflate the opening of the eustachian tube (balloon dilation). Follow these instructions at home: Lifestyle Do not do any of the following until your health care provider approves: Travel to high altitudes. Fly in airplanes. Work in a [employer] scheme manager or room. Scuba dive. Do not use any products that contain nicotine or tobacco. These products include cigarettes, chewing tobacco, and vaping devices, such as e-cigarettes. If you need help quitting, ask your health care provider. Keep your ears dry. Wear fitted earplugs during showering and bathing. Dry your ears completely after. General instructions Take over-the-counter and prescription medicines only as told by your health care provider. Use techniques to help pop your ears as recommended by  your health care provider. These may include: Chewing gum. Yawning. Frequent, forceful swallowing. Closing your mouth, holding your nose closed, and gently blowing as if you are trying to blow air out of  your nose. Keep all follow-up visits. This is important. Contact a health care provider if: Your symptoms do not go away after treatment. Your symptoms come back after treatment. You are unable to pop your ears. You have: A fever. Pain in your ear. Pain in your head or neck. Fluid draining from your ear. Your hearing suddenly changes. You become very dizzy. You lose your balance. Get help right away if: You have a sudden, severe increase in any of your symptoms. Summary Eustachian tube dysfunction refers to a condition in which a blockage develops in the eustachian tube. It can be caused by ear infections, allergies, inhaled irritants, or abnormal growths in the nose or throat. Symptoms may include ear pain or fullness, hearing loss, or ringing in the ears. Mild cases are treated with techniques to unblock the ears, such as yawning or chewing gum. More severe cases are treated with medicines or procedures. This information is not intended to replace advice given to you by your health care provider. Make sure you discuss any questions you have with your health care provider. Document Revised: 05/08/2020 Document Reviewed: 05/08/2020 Elsevier Patient Education  [place].

## 2021-11-29 ENCOUNTER — Other Ambulatory Visit: Payer: Self-pay | Admitting: Nurse Practitioner

## 2021-11-29 ENCOUNTER — Encounter: Payer: Self-pay | Admitting: Nurse Practitioner

## 2021-11-29 DIAGNOSIS — N183 Chronic kidney disease, stage 3 unspecified: Secondary | ICD-10-CM

## 2021-11-29 LAB — CBC WITH DIFFERENTIAL/PLATELET
Absolute Monocytes: 302 cells/uL (ref 200–950)
Basophils Absolute: 29 cells/uL (ref 0–200)
Basophils Relative: 0.6 %
Eosinophils Absolute: 48 cells/uL (ref 15–500)
Eosinophils Relative: 1 %
HCT: 43.2 % (ref 35.0–45.0)
Hemoglobin: 15.1 g/dL (ref 11.7–15.5)
Lymphs Abs: 499 cells/uL — ABNORMAL LOW (ref 850–3900)
MCH: 34.6 pg — ABNORMAL HIGH (ref 27.0–33.0)
MCHC: 35 g/dL (ref 32.0–36.0)
MCV: 98.9 fL (ref 80.0–100.0)
MPV: 9.5 fL (ref 7.5–12.5)
Monocytes Relative: 6.3 %
Neutro Abs: 3922 cells/uL (ref 1500–7800)
Neutrophils Relative %: 81.7 %
Platelets: 268 10*3/uL (ref 140–400)
RBC: 4.37 10*6/uL (ref 3.80–5.10)
RDW: 11.8 % (ref 11.0–15.0)
Total Lymphocyte: 10.4 %
WBC: 4.8 10*3/uL (ref 3.8–10.8)

## 2021-11-29 LAB — URINALYSIS, ROUTINE W REFLEX MICROSCOPIC
Bacteria, UA: NONE SEEN /HPF
Bilirubin Urine: NEGATIVE
Glucose, UA: NEGATIVE
Hgb urine dipstick: NEGATIVE
Nitrite: NEGATIVE
Specific Gravity, Urine: 1.035 (ref 1.001–1.035)
WBC, UA: NONE SEEN /HPF (ref 0–5)
pH: <=5 (ref 5.0–8.0)

## 2021-11-29 LAB — COMPLETE METABOLIC PANEL WITH GFR
AG Ratio: 2.2 (calc) (ref 1.0–2.5)
ALT: 13 U/L (ref 6–29)
AST: 18 U/L (ref 10–35)
Albumin: 4.6 g/dL (ref 3.6–5.1)
Alkaline phosphatase (APISO): 58 U/L (ref 37–153)
BUN/Creatinine Ratio: 16 (calc) (ref 6–22)
BUN: 17 mg/dL (ref 7–25)
CO2: 27 mmol/L (ref 20–32)
Calcium: 9.5 mg/dL (ref 8.6–10.4)
Chloride: 102 mmol/L (ref 98–110)
Creat: 1.05 mg/dL — ABNORMAL HIGH (ref 0.60–1.00)
Globulin: 2.1 g/dL (calc) (ref 1.9–3.7)
Glucose, Bld: 84 mg/dL (ref 65–99)
Potassium: 4.2 mmol/L (ref 3.5–5.3)
Sodium: 138 mmol/L (ref 135–146)
Total Bilirubin: 0.7 mg/dL (ref 0.2–1.2)
Total Protein: 6.7 g/dL (ref 6.1–8.1)
eGFR: 56 mL/min/{1.73_m2} — ABNORMAL LOW (ref >=60)

## 2021-11-29 LAB — MAGNESIUM: Magnesium: 2.4 mg/dL (ref 1.5–2.5)

## 2021-11-29 LAB — LIPID PANEL
Cholesterol: 153 mg/dL (ref <200)
HDL: 74 mg/dL (ref >=50)
LDL Cholesterol (Calc): 61 mg/dL (calc)
Non-HDL Cholesterol (Calc): 79 mg/dL (calc) (ref <130)
Total CHOL/HDL Ratio: 2.1 (calc) (ref <5.0)
Triglycerides: 93 mg/dL (ref <150)

## 2021-11-29 LAB — HEMOGLOBIN A1C
Hgb A1c MFr Bld: 4.8 % of total Hgb (ref <5.7)
Mean Plasma Glucose: 91 mg/dL
eAG (mmol/L): 5 mmol/L

## 2021-11-29 LAB — VITAMIN D 25 HYDROXY (VIT D DEFICIENCY, FRACTURES): Vit D, 25-Hydroxy: 49 ng/mL (ref 30–100)

## 2021-11-29 LAB — MICROALBUMIN / CREATININE URINE RATIO
Creatinine, Urine: 386 mg/dL — ABNORMAL HIGH (ref 20–275)
Microalb Creat Ratio: 37 mcg/mg creat — ABNORMAL HIGH (ref <30)
Microalb, Ur: 14.1 mg/dL

## 2021-11-29 LAB — MICROSCOPIC MESSAGE

## 2021-11-29 LAB — TSH: TSH: 0.67 mIU/L (ref 0.40–4.50)

## 2021-12-17 ENCOUNTER — Other Ambulatory Visit (HOSPITAL_COMMUNITY): Payer: Self-pay

## 2021-12-17 ENCOUNTER — Other Ambulatory Visit: Payer: Self-pay | Admitting: Nurse Practitioner

## 2021-12-17 MED ORDER — WEGOVY 2.4 MG/0.75ML ~~LOC~~ SOAJ
2.4000 mg | SUBCUTANEOUS | 2 refills | Status: DC
  Filled 2021-12-17: qty 3, 28d supply, fill #0
  Filled 2022-01-14: qty 3, 28d supply, fill #1
  Filled 2022-02-10: qty 3, 28d supply, fill #2

## 2021-12-27 ENCOUNTER — Ambulatory Visit (INDEPENDENT_AMBULATORY_CARE_PROVIDER_SITE_OTHER): Payer: Medicare PPO

## 2021-12-27 DIAGNOSIS — N183 Chronic kidney disease, stage 3 unspecified: Secondary | ICD-10-CM

## 2021-12-27 NOTE — Progress Notes (Signed)
The patient came in for repeat blood work. She reports that she is trying to drink more fluids. At today's nurse visit she reports no new problems or issues.

## 2021-12-28 LAB — BASIC METABOLIC PANEL WITH GFR
BUN: 21 mg/dL (ref 7–25)
CO2: 27 mmol/L (ref 20–32)
Calcium: 9.4 mg/dL (ref 8.6–10.4)
Chloride: 104 mmol/L (ref 98–110)
Creat: 0.93 mg/dL (ref 0.60–1.00)
Glucose, Bld: 90 mg/dL (ref 65–99)
Potassium: 4.9 mmol/L (ref 3.5–5.3)
Sodium: 139 mmol/L (ref 135–146)
eGFR: 65 mL/min/{1.73_m2} (ref >=60)

## 2021-12-28 LAB — MICROALBUMIN / CREATININE URINE RATIO
Creatinine, Urine: 202 mg/dL (ref 20–275)
Microalb Creat Ratio: 4 mcg/mg creat (ref <30)
Microalb, Ur: 0.9 mg/dL

## 2021-12-31 DIAGNOSIS — H3530 Unspecified macular degeneration: Secondary | ICD-10-CM | POA: Diagnosis not present

## 2022-01-14 ENCOUNTER — Other Ambulatory Visit (HOSPITAL_COMMUNITY): Payer: Self-pay

## 2022-01-23 NOTE — Progress Notes (Deleted)
Assessment and Plan:  There are no diagnoses linked to this encounter.    Further disposition pending results of labs. Discussed med's effects and SE's.   Over 30 minutes of exam, counseling, chart review, and critical decision making was performed.   Future Appointments  Date Time Provider [place]  01/25/2022  9:30 AM Alycia Rossetti, NP [facility] None  07/15/2022  9:00 AM Alycia Rossetti, NP [facility] None  11/29/2022 10:00 AM Alycia Rossetti, NP [facility] None    ------------------------------------------------------------------------------------------------------------------   HPI LMP 03/11/2000  71 y.o.female presents for  Past Medical History:  Diagnosis Date   Allergy    Anxiety    Arthritis    CKD (chronic kidney disease) stage 3, GFR 30-59 ml/min (HCC) 01/11/2020   Diverticulitis 2004   Hypothyroid    Labile hypertension 02/07/2021   Neutropenia (HCC)      Allergies  Allergen Reactions   Quinolones     Aortic aneurysm    Current Outpatient Medications on File Prior to Visit  Medication Sig   B Complex Vitamins (B COMPLEX PO) Take by mouth.   Cholecalciferol (VITAMIN D) 2000 UNITS tablet Take 2,000 Units by mouth daily.   diclofenac (VOLTAREN) 75 MG EC tablet Take  1 tablet  2 x/day  with Meals  for Pain & Inflammation (Patient taking differently: Take  1 tablet  2 x/day  with Meals  for Pain & Inflammation PRN)   diclofenac Sodium (VOLTAREN) 1 % GEL Apply 4 grams topically 4 times daily   fexofenadine (ALLEGRA) 60 MG tablet Take 60 mg by mouth daily as needed for allergies or rhinitis.   levothyroxine (SYNTHROID) 125 MCG tablet TAKE ONE TABLET BY MOUTH ONE TIME DAILY ON EMPTY STOMACH WITH ONLY WATER FOR 30 MIN. NO ANTACIDS, CALCIUM, MAGNESIUM FOR 4 HOURS AND AVOID BIOTIN   MAGNESIUM PO Take by mouth daily.   rosuvastatin (CRESTOR) 5 MG tablet Take 1 tab daily to reduce risk of heart attack and stroke.   Semaglutide-Weight Management (WEGOVY)  2.4 MG/0.75ML SOAJ Inject 2.4 mg into the skin once a week.   Wheat Dextrin (BENEFIBER) POWD Take by mouth 2 (two) times daily.   No current facility-administered medications on file prior to visit.    ROS: all negative except above.   Physical Exam:  LMP 03/11/2000   General Appearance: Well nourished, in no apparent distress. Eyes: PERRLA, EOMs, conjunctiva no swelling or erythema Sinuses: No Frontal/maxillary tenderness ENT/Mouth: Ext aud canals clear, TMs without erythema, bulging. No erythema, swelling, or exudate on post pharynx.  Tonsils not swollen or erythematous. Hearing normal.  Neck: Supple, thyroid normal.  Respiratory: Respiratory effort normal, BS equal bilaterally without rales, rhonchi, wheezing or stridor.  Cardio: RRR with no MRGs. Brisk peripheral pulses without edema.  Abdomen: Soft, + BS.  Non tender, no guarding, rebound, hernias, masses. Lymphatics: Non tender without lymphadenopathy.  Musculoskeletal: Full ROM, 5/5 strength, normal gait.  Skin: Warm, dry without rashes, lesions, ecchymosis.  Neuro: Cranial nerves intact. Normal muscle tone, no cerebellar symptoms. Sensation intact.  Psych: Awake and oriented X 3, normal affect, Insight and Judgment appropriate.     Alycia Rossetti, NP 2:12 PM [facility]

## 2022-01-25 ENCOUNTER — Ambulatory Visit: Payer: Medicare PPO | Admitting: Nurse Practitioner

## 2022-01-29 ENCOUNTER — Other Ambulatory Visit: Payer: Self-pay

## 2022-01-29 MED ORDER — LEVOTHYROXINE SODIUM 125 MCG PO TABS: ORAL_TABLET | ORAL | 0 refills | Status: DC

## 2022-02-05 DIAGNOSIS — L821 Other seborrheic keratosis: Secondary | ICD-10-CM | POA: Diagnosis not present

## 2022-02-05 DIAGNOSIS — D225 Melanocytic nevi of trunk: Secondary | ICD-10-CM | POA: Diagnosis not present

## 2022-02-05 DIAGNOSIS — D485 Neoplasm of uncertain behavior of skin: Secondary | ICD-10-CM | POA: Diagnosis not present

## 2022-02-05 DIAGNOSIS — D2230 Melanocytic nevi of unspecified part of face: Secondary | ICD-10-CM | POA: Diagnosis not present

## 2022-02-05 DIAGNOSIS — Z85828 Personal history of other malignant neoplasm of skin: Secondary | ICD-10-CM | POA: Diagnosis not present

## 2022-02-05 DIAGNOSIS — D2272 Melanocytic nevi of left lower limb, including hip: Secondary | ICD-10-CM | POA: Diagnosis not present

## 2022-02-05 DIAGNOSIS — L578 Other skin changes due to chronic exposure to nonionizing radiation: Secondary | ICD-10-CM | POA: Diagnosis not present

## 2022-02-11 ENCOUNTER — Other Ambulatory Visit (HOSPITAL_COMMUNITY): Payer: Self-pay

## 2022-03-12 ENCOUNTER — Other Ambulatory Visit (HOSPITAL_COMMUNITY): Payer: Self-pay

## 2022-03-12 ENCOUNTER — Other Ambulatory Visit: Payer: Self-pay | Admitting: Nurse Practitioner

## 2022-03-12 MED ORDER — WEGOVY 2.4 MG/0.75ML ~~LOC~~ SOAJ
2.4000 mg | SUBCUTANEOUS | 2 refills | Status: DC
  Filled 2022-03-12: qty 3, 28d supply, fill #0
  Filled 2022-04-08: qty 3, 28d supply, fill #1
  Filled 2022-05-06: qty 3, 28d supply, fill #2

## 2022-03-21 DIAGNOSIS — Z1231 Encounter for screening mammogram for malignant neoplasm of breast: Secondary | ICD-10-CM | POA: Diagnosis not present

## 2022-03-21 LAB — HM MAMMOGRAPHY

## 2022-03-28 NOTE — Progress Notes (Deleted)
THIS ENCOUNTER IS A VIRTUAL VISIT DUE TO COVID-19 - PATIENT WAS NOT SEEN IN THE OFFICE.  PATIENT HAS CONSENTED TO VIRTUAL VISIT / TELEMEDICINE VISIT   Virtual Visit via {[email or web address]} Note  I connected with  Kimberly Bowers on 03/29/2022 by {[email or web address]}.  I verified that I am speaking with the correct person using two identifiers.    I discussed the limitations of evaluation and management by telemedicine and the availability of in person appointments. The patient expressed understanding and agreed to proceed.  History of Present Illness:  LMP 03/11/2000  72 y.o. patient contacted office reporting URI sx ***. she tested positive by ***. OV was conducted by telephone to minimize exposure. This patient *** vaccinated for covid 19, last ***  Sx began *** days ago with ***  Treatments tried so far: ***  Exposures: ***   Medications  Current Outpatient Medications (Endocrine & Metabolic):    levothyroxine (SYNTHROID) 125 MCG tablet, TAKE ONE TABLET BY MOUTH ONE TIME DAILY ON EMPTY STOMACH WITH ONLY WATER FOR 30 MIN. NO ANTACIDS, CALCIUM, MAGNESIUM FOR 4 HOURS AND AVOID BIOTIN  Current Outpatient Medications (Cardiovascular):    rosuvastatin (CRESTOR) 5 MG tablet, Take 1 tab daily to reduce risk of heart attack and stroke.  Current Outpatient Medications (Respiratory):    fexofenadine (ALLEGRA) 60 MG tablet, Take 60 mg by mouth daily as needed for allergies or rhinitis.  Current Outpatient Medications (Analgesics):    diclofenac (VOLTAREN) 75 MG EC tablet, Take  1 tablet  2 x/day  with Meals  for Pain & Inflammation (Patient taking differently: Take  1 tablet  2 x/day  with Meals  for Pain & Inflammation PRN)   Current Outpatient Medications (Other):    B Complex Vitamins (B COMPLEX PO), Take by mouth.   Cholecalciferol (VITAMIN D) 2000 UNITS tablet, Take 2,000 Units by mouth daily.   diclofenac Sodium (VOLTAREN) 1 % GEL, Apply 4 grams topically 4 times  daily   MAGNESIUM PO, Take by mouth daily.   Semaglutide-Weight Management (WEGOVY) 2.4 MG/0.75ML SOAJ, Inject 2.4 mg into the skin once a week.   Wheat Dextrin (BENEFIBER) POWD, Take by mouth 2 (two) times daily.  Allergies:  Allergies  Allergen Reactions   Quinolones     Aortic aneurysm    Problem list She has Anxiety; Hypothyroid; Allergy; Mixed hyperlipidemia; Morbid obesity ([place]); Vitamin D deficiency; Medication management; Aortic dilatation ([place]); Gallstones; Arthritis of left knee; Labile hypertension; and CKD (chronic kidney disease) stage 2, GFR 60-89 ml/min on their problem list.   Social History:   reports that she has never smoked. She has never used smokeless tobacco. She reports current alcohol use of about 1.0 - 2.0 standard drink of alcohol per week. She reports that she does not use drugs.  Observations/Objective:  General : Well sounding patient in no apparent distress HEENT: no hoarseness, no cough for duration of visit Lungs: speaks in complete sentences, no audible wheezing, no apparent distress Neurological: alert, oriented x 3 Psychiatric: pleasant, judgement appropriate   Assessment and Plan:  Covid 19 Covid 19 positive per rapid screening test in *** Risk factors include: *** Symptoms are: mild*** Due to co morbid conditions and risk factors, discussed antivirals *** Immue support reviewed *** Take tylenol PRN temp 101+ Push hydration Regular ambulation or calf exercises exercises for clot prevention and 81 mg ASA unless contraindicated Sx supportive therapy suggested Follow up via mychart or telephone if needed Advised patient obtain O2 monitor; present to ED if persistently <  90% or with severe dyspnea, CP, fever uncontrolled by tylenol, confusion, sudden decline Should remain in isolation 5 days from testing positive and then wear a mask when around other people for the following 5 days   Follow Up Instructions:  I discussed the assessment and  treatment plan with the patient. The patient was provided an opportunity to ask questions and all were answered. The patient agreed with the plan and demonstrated an understanding of the instructions.   The patient was advised to call back or seek an in-person evaluation if the symptoms worsen or if the condition fails to improve as anticipated.  I provided *** minutes of non-face-to-face time during this encounter.   Alycia Rossetti, NP

## 2022-03-29 ENCOUNTER — Ambulatory Visit: Payer: Medicare PPO | Admitting: Nurse Practitioner

## 2022-04-02 ENCOUNTER — Encounter: Payer: Self-pay | Admitting: Internal Medicine

## 2022-04-08 ENCOUNTER — Other Ambulatory Visit: Payer: Self-pay

## 2022-04-11 ENCOUNTER — Other Ambulatory Visit: Payer: Self-pay | Admitting: Nurse Practitioner

## 2022-04-15 DIAGNOSIS — Z780 Asymptomatic menopausal state: Secondary | ICD-10-CM | POA: Diagnosis not present

## 2022-04-15 LAB — HM DEXA SCAN

## 2022-04-17 DIAGNOSIS — M25551 Pain in right hip: Secondary | ICD-10-CM | POA: Diagnosis not present

## 2022-04-17 DIAGNOSIS — M533 Sacrococcygeal disorders, not elsewhere classified: Secondary | ICD-10-CM | POA: Diagnosis not present

## 2022-04-22 ENCOUNTER — Encounter: Payer: Self-pay | Admitting: Nurse Practitioner

## 2022-04-22 DIAGNOSIS — M25551 Pain in right hip: Secondary | ICD-10-CM | POA: Diagnosis not present

## 2022-04-23 ENCOUNTER — Encounter: Payer: Self-pay | Admitting: Internal Medicine

## 2022-05-02 ENCOUNTER — Encounter: Payer: Self-pay | Admitting: Nurse Practitioner

## 2022-05-06 ENCOUNTER — Other Ambulatory Visit (HOSPITAL_COMMUNITY): Payer: Self-pay

## 2022-05-13 DIAGNOSIS — M25551 Pain in right hip: Secondary | ICD-10-CM | POA: Diagnosis not present

## 2022-05-13 DIAGNOSIS — R269 Unspecified abnormalities of gait and mobility: Secondary | ICD-10-CM | POA: Diagnosis not present

## 2022-05-21 ENCOUNTER — Other Ambulatory Visit: Payer: Self-pay

## 2022-05-21 DIAGNOSIS — M25551 Pain in right hip: Secondary | ICD-10-CM | POA: Diagnosis not present

## 2022-05-21 DIAGNOSIS — M533 Sacrococcygeal disorders, not elsewhere classified: Secondary | ICD-10-CM | POA: Diagnosis not present

## 2022-05-21 MED ORDER — LEVOTHYROXINE SODIUM 125 MCG PO TABS: ORAL_TABLET | ORAL | 0 refills | Status: DC

## 2022-05-27 DIAGNOSIS — M25551 Pain in right hip: Secondary | ICD-10-CM | POA: Diagnosis not present

## 2022-05-27 DIAGNOSIS — R269 Unspecified abnormalities of gait and mobility: Secondary | ICD-10-CM | POA: Diagnosis not present

## 2022-06-03 ENCOUNTER — Other Ambulatory Visit (HOSPITAL_COMMUNITY): Payer: Self-pay

## 2022-06-03 ENCOUNTER — Other Ambulatory Visit: Payer: Self-pay | Admitting: Nurse Practitioner

## 2022-06-03 MED ORDER — WEGOVY 2.4 MG/0.75ML ~~LOC~~ SOAJ
2.4000 mg | SUBCUTANEOUS | 2 refills | Status: DC
  Filled 2022-06-03: qty 3, 28d supply, fill #0
  Filled 2022-07-01: qty 3, 28d supply, fill #1
  Filled 2022-07-29: qty 3, 28d supply, fill #2

## 2022-06-14 DIAGNOSIS — M533 Sacrococcygeal disorders, not elsewhere classified: Secondary | ICD-10-CM | POA: Diagnosis not present

## 2022-06-18 DIAGNOSIS — R269 Unspecified abnormalities of gait and mobility: Secondary | ICD-10-CM | POA: Diagnosis not present

## 2022-06-18 DIAGNOSIS — M25551 Pain in right hip: Secondary | ICD-10-CM | POA: Diagnosis not present

## 2022-06-20 ENCOUNTER — Other Ambulatory Visit: Payer: Self-pay | Admitting: Thoracic Surgery (Cardiothoracic Vascular Surgery)

## 2022-06-20 DIAGNOSIS — I7121 Aneurysm of the ascending aorta, without rupture: Secondary | ICD-10-CM

## 2022-07-02 ENCOUNTER — Other Ambulatory Visit (HOSPITAL_COMMUNITY): Payer: Self-pay

## 2022-07-02 DIAGNOSIS — M25551 Pain in right hip: Secondary | ICD-10-CM | POA: Diagnosis not present

## 2022-07-02 DIAGNOSIS — R269 Unspecified abnormalities of gait and mobility: Secondary | ICD-10-CM | POA: Diagnosis not present

## 2022-07-09 DIAGNOSIS — M533 Sacrococcygeal disorders, not elsewhere classified: Secondary | ICD-10-CM | POA: Diagnosis not present

## 2022-07-09 DIAGNOSIS — M1712 Unilateral primary osteoarthritis, left knee: Secondary | ICD-10-CM | POA: Diagnosis not present

## 2022-07-10 NOTE — Progress Notes (Signed)
AWV AND FOLLOW UP  Assessment:   Annual Medicare Wellness Visit Due annually  Health maintenance reviewed  Labile Hypertension - Currently controlled without medications, continue DASH diet, exercise and monitor at home. Call if greater than 130/80.    Aortic dilatation (HCC) See on Echo 2021, interval enlargement to 41 mm in 2022 and now follows with CV surgery  Control BP   Overweight Continue Wegovy 2.4 mg SQ QW Discussed protein and resistance exercises Keep a food log for review at next visit  Close follow up 3-4 months  Mixed hyperlipidemia -     Lipid panel check lipids decrease fatty foods increase activity.   CKD II Increase fluids, avoid NSAIDS, monitor sugars, will monitor - CMP/GFR  Medication management -     CBC with Differential/Platelet -     COMPLETE METABOLIC PANEL WITH GFR -     Magnesium  Vitamin D deficiency -     VITAMIN D 25 Hydroxy (Vit-D Deficiency, Fractures)  Hypothyroidism, unspecified type -     TSH Hypothyroidism-check TSH level, continue medications the same, reminded to take on an empty stomach 30-72mins before food.  Takes 1 tab of levothyroxine 125 mcg daily except Sunday takes 1/2 tab  Allergic state, subsequent encounter Continue to monitor  Anxiety Monitor  Laceration right arm Monitor for s/s of infection Keep clean and dry  Td given     Future Appointments  Date Time Provider Department Center  07/29/2022 12:00 PM [facility] 1 [facility]  08/06/2022 12:00 PM Loreli Slot, MD [facility]  11/29/2022 10:00 AM Raynelle Dick, NP [facility] None  05/29/2023  9:00 AM Raynelle Dick, NP [facility] None    Plan:   During the course of the visit the patient was educated and counseled about appropriate screening and preventive services including:   Pneumococcal vaccine  Prevnar 13 Influenza vaccine Td vaccine Screening electrocardiogram Bone densitometry screening Colorectal cancer  screening Diabetes screening Glaucoma screening Nutrition counseling  Advanced directives: requested   Subjective:   Kimberly Bowers is a 72 y.o. female who presents for AWV and 3 month follow up on hypertension, hypothyroidism, obesity, hyperlipidemia, vitamin D def. She has Anxiety; Hypothyroid; Allergy; Mixed hyperlipidemia; Morbid obesity (HCC); Vitamin D deficiency; Medication management; Aortic dilatation (HCC); Gallstones; Arthritis of left knee; Labile hypertension; and CKD (chronic kidney disease) stage 2, GFR 60-89 ml/min on their problem list.  Left knee arthritis, intermittent back pain, follows with [facility] - Dr. Cleophas Dunker,  has cortisone shots and doing strengthening exercises. Currently has been more painful and is undergoing PT.  Will need knee replacement in the near future- possibly fall.  She also have SI problems on right side and undergoing PT.  She does PT exercises daily and does a recumbent bike daily.  Also doing free weights 3-4 times a week.    BMI is Body mass index is 29.99 kg/m., she is working on diet and exercise, walking, doing free weights to failure and recumbent bike. Highest weight 229 She failed lifestyle, appetite suppressants, now on wegovy 2.4 mg, has noted reduces cravings. Following mediterranean diet, doing 1200 cal daily  Wt Readings from Last 3 Encounters:  07/15/22 181 lb 9.6 oz (82.4 kg)  12/27/21 195 lb (88.5 kg)  11/28/21 197 lb (89.4 kg)   She had ECHO 09/2019 with EF 55-60%, grade 1 dystolic dysfunction, trivial MVR, did show mild ascending aortic dilation of 38 mm, follow up ECHO 12/26/2020 showed dilation of 41 mm and  was referred to CV surgery,She has seen Dr. Dorris Fetch 07/24/21 and has a follow up CTA chest aorta on 08/06/22  Her blood pressure has been controlled at home, today their BP is BP: 128/76  BP Readings from Last 3 Encounters:  07/15/22 128/76  12/27/21 114/70  11/28/21 108/80  She does workout, she is walking daily,  doing free weights.  She denies chest pain, shortness of breath, dizziness.   Small cut on right forearm- uncertain of cause, denies drainage or fever  She is on cholesterol medication, Rosuvastatin 5 mg QD and denies myalgias. Her cholesterol is not at goal. The cholesterol last visit was:   Lab Results  Component Value Date   CHOL 153 11/28/2021   HDL 74 11/28/2021   LDLCALC 61 11/28/2021   TRIG 93 11/28/2021   CHOLHDL 2.1 11/28/2021   Last Z6X in the office was:  Lab Results  Component Value Date   HGBA1C 4.8 11/28/2021   She has been trying to push lots of water throughout the day She has CKD II/III, recently improved, monitored at this office; normal renal ultrasound in 11/2019, did incidentally show gallstones, has stopped diclofenac oral, takes rarely PRN Lab Results  Component Value Date   EGFR 65 12/27/2021    Patient is on Vitamin D supplement, 2000 IU daily.  Lab Results  Component Value Date   VD25OH 45 11/28/2021     She is on thyroid medication. Her medication was not changed last visit. She is on every day except 1/2 tab on Sunday..  She is not on a biotin.  Lab Results  Component Value Date   TSH 0.67 11/28/2021     Medication Review  Current Outpatient Medications (Endocrine & Metabolic):    levothyroxine (SYNTHROID) 125 MCG tablet, take 1 tablet by mouth once a day on empty stomach with only water for 30 minutes, no antacids, calcium, magnesium for 4 hours and avoid biotin  Current Outpatient Medications (Cardiovascular):    rosuvastatin (CRESTOR) 5 MG tablet, Take 1 tab daily to reduce risk of heart attack and stroke.  Current Outpatient Medications (Respiratory):    fexofenadine (ALLEGRA) 60 MG tablet, Take 60 mg by mouth daily as needed for allergies or rhinitis.  Current Outpatient Medications (Analgesics):    Acetaminophen (TYLENOL ARTHRITIS EXT RELIEF PO), Take by mouth.   diclofenac (VOLTAREN) 75 MG EC tablet, Take  1 tablet  2 x/day   with Meals  for Pain & Inflammation (Patient not taking: Reported on 07/15/2022)   Current Outpatient Medications (Other):    B Complex Vitamins (B COMPLEX PO), Take by mouth.   Cholecalciferol (VITAMIN D) 2000 UNITS tablet, Take 2,000 Units by mouth daily.   diclofenac Sodium (VOLTAREN) 1 % GEL, Apply 4 grams topically 4 times daily   MAGNESIUM PO, Take by mouth daily.   Omega-3 Fatty Acids (OMEGA 3 PO), Take by mouth.   Semaglutide-Weight Management (WEGOVY) 2.4 MG/0.75ML SOAJ, Inject 2.4 mg into the skin once a week.   Wheat Dextrin (BENEFIBER) POWD, Take by mouth 2 (two) times daily.  Current Problems (verified) Patient Active Problem List   Diagnosis Date Noted   CKD (chronic kidney disease) stage 2, GFR 60-89 ml/min 07/12/2021   Labile hypertension 02/07/2021   Arthritis of left knee 07/12/2020   Gallstones 01/10/2020   Aortic dilatation (HCC) 10/13/2019   Morbid obesity (HCC) 03/17/2014   Vitamin D deficiency 03/17/2014   Medication management 03/17/2014   Mixed hyperlipidemia 03/25/2013   Anxiety  Hypothyroid    Allergy     Screening Tests Immunization History  Administered Date(s) Administered   Influenza Split 12/08/2013   Influenza, High Dose Seasonal PF 11/19/2014, 11/01/2016, 12/19/2017, 11/18/2018   Influenza-Unspecified 12/29/2012, 12/19/2017, 11/09/2019, 12/08/2020, 11/27/2021   Moderna Covid-19 Vaccine Bivalent Booster 59yrs & up 11/27/2021   PFIZER Comirnaty(Gray Top)Covid-19 Tri-Sucrose Vaccine 07/08/2021   PFIZER(Purple Top)SARS-COV-2 Vaccination 04/04/2019, 04/23/2019, 11/09/2019, 06/29/2020, 11/14/2020   Pneumococcal Conjugate-13 09/05/2014   Pneumococcal Polysaccharide-23 11/02/2015   Td 03/11/2004   Tdap 07/08/2012   Zoster Recombinat (Shingrix) 01/04/2019, 03/09/2019   Zoster, Live 07/08/2012   Health Maintenance  Topic Date Due   DTaP/Tdap/Td (3 - Td or Tdap) 07/09/2022   COVID-19 Vaccine (8 - 2023-24 season) 07/31/2022 (Originally 01/22/2022)    INFLUENZA VACCINE  10/10/2022   COLONOSCOPY (Pts 45-11yrs Insurance coverage will need to be confirmed)  11/28/2022   MAMMOGRAM  03/22/2023   Medicare Annual Wellness (AWV)  07/15/2023   Pneumonia Vaccine 56+ Years old  Completed   DEXA SCAN  Completed   Hepatitis C Screening  Completed   Zoster Vaccines- Shingrix  Completed   HPV VACCINES  Aged Out    Names of Other Physician/Practitioners you currently use: 1. [facility]- here for primary care 2. Dr. [place], eye doctor, last visit 12/2021 3. Dr. Naomie Dean, dentist, upcoming 07/25/22 4. Dr. Elmon Else, derm, 12/2020, goes annually   Patient Care Team: Lucky Cowboy, MD as PCP - General (Internal Medicine) Mardella Layman, MD as Consulting Physician (Gastroenterology) Salvatore Marvel, MD as Consulting Physician (Orthopedic Surgery) Jerene Bears, MD as Consulting Physician (Gynecology)   Allergies Allergies  Allergen Reactions   Quinolones     Aortic aneurysm    SURGICAL HISTORY She  has a past surgical history that includes Laparoscopic gastric banding (2004); Knee arthroscopy (Left); Uterine fibroid surgery (2001); Foot surgery (Right); Tubal ligation; Laparoscopic repair and removal of gastric band; Brachioplasty (03/2016); Cataract extraction (Bilateral, 2019); and Breast biopsy (Left, 01/2018). FAMILY HISTORY Her family history includes Cancer in her mother; Diabetes in her father; Emphysema in her father; Melanoma in her brother. SOCIAL HISTORY She  reports that she has never smoked. She has never used smokeless tobacco. She reports current alcohol use of about 1.0 - 2.0 standard drink of alcohol per week. She reports that she does not use drugs.  MEDICARE WELLNESS OBJECTIVES: Physical activity: Current Exercise Habits: Home exercise routine;Structured exercise class, Type of exercise: walking;Other - see comments (recumbent bike), Time (Minutes): 30, Frequency (Times/Week):  7, Weekly Exercise (Minutes/Week): 210, Intensity: Mild, Exercise limited by: orthopedic condition(s) Cardiac risk factors: Cardiac Risk Factors include: advanced age (>42men, >66 women);dyslipidemia;sedentary lifestyle Depression/mood screen:      07/15/2022    9:24 AM  Depression screen PHQ 2/9  Decreased Interest 0  Down, Depressed, Hopeless 0  PHQ - 2 Score 0    ADLs:     07/15/2022    9:24 AM  In your present state of health, do you have any difficulty performing the following activities:  Hearing? 0  Vision? 0  Difficulty concentrating or making decisions? 0  Walking or climbing stairs? 1  Dressing or bathing? 0  Doing errands, shopping? 0     Cognitive Testing  Alert? Yes  Normal Appearance?Yes  Oriented to person? Yes  Place? Yes   Time? Yes  Recall of three objects?  Yes  Can perform simple calculations? Yes  Displays appropriate judgment?Yes  Can read the correct time from a  watch face?Yes  EOL planning: Does Patient Have a Medical Advance Directive?: Yes Type of Advance Directive: Healthcare Power of Attorney, Living will Does patient want to make changes to medical advance directive?: No - Patient declined Copy of Healthcare Power of Attorney in Chart?: No - copy requested       Review of Systems:  Review of Systems  Constitutional: Negative.  Negative for malaise/fatigue and weight loss.  HENT: Negative.  Negative for hearing loss and tinnitus.   Eyes: Negative.  Negative for blurred vision and double vision.  Respiratory: Negative.  Negative for cough, sputum production, shortness of breath and wheezing.   Cardiovascular: Negative.  Negative for chest pain, palpitations, orthopnea, claudication, leg swelling and PND.  Gastrointestinal: Negative.  Negative for abdominal pain, blood in stool, constipation, diarrhea, heartburn, melena, nausea and vomiting.  Genitourinary: Negative.   Musculoskeletal:  Positive for joint pain (left knee, Right SI joint).  Negative for falls and myalgias.  Skin: Negative.  Negative for rash.       Small cut right arm  Neurological: Negative.  Negative for dizziness, tingling, sensory change, weakness and headaches.  Endo/Heme/Allergies: Negative.  Negative for polydipsia.  Psychiatric/Behavioral: Negative.  Negative for depression, memory loss, substance abuse and suicidal ideas. The patient is not nervous/anxious and does not have insomnia.   All other systems reviewed and are negative.   Objective:   Today's Vitals   07/15/22 0854  BP: 128/76  Pulse: 87  Temp: 97.9 F (36.6 C)  SpO2: 97%  Weight: 181 lb 9.6 oz (82.4 kg)  Height: 5' 5.25" (1.657 m)    General appearance: alert, no distress, WD/WN,  female HEENT: normocephalic, sclerae anicteric, TMs pearly, nares patent, no discharge or erythema, pharynx normal Oral cavity: MMM, no lesions Neck: supple, no lymphadenopathy, no thyromegaly, no masses Heart: RRR, normal S1, S2, no radiation to carotids Lungs: CTA bilaterally, no wheezes, rhonchi, or rales Abdomen: +bs, soft, non tender, non distended, no masses, no hepatomegaly, no splenomegaly Musculoskeletal: nontender, no swelling, than 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  Extremities: no edema, no cyanosis, no clubbing Pulses: 2+ symmetric, upper and lower extremities, normal cap refill Skin: warm, dry. 2 cm laceration healing well without signs of infection Neurological: alert, oriented x 3, CN2-12 intact, strength normal upper extremities and lower extremities, sensation normal throughout, DTRs 2+ throughout, no cerebellar signs, gait antalgic Psychiatric: normal affect, behavior normal, pleasant    Medicare Attestation I have personally reviewed: The patient's medical and social history Their use of alcohol, tobacco or illicit drugs Their current medications and supplements The patient's functional ability including ADLs,fall risks, home safety risks, cognitive, and hearing and visual  impairment Diet and physical activities Evidence for depression or mood disorders  The patient's weight, height, BMI, and visual acuity have been recorded in the chart.  I have made referrals, counseling, and provided education to the patient based on review of the above and I have provided the patient with a written personalized care plan for preventive services.      Raynelle Dick, NP 9:29 AM [facility]

## 2022-07-15 ENCOUNTER — Encounter: Payer: Self-pay | Admitting: Nurse Practitioner

## 2022-07-15 ENCOUNTER — Ambulatory Visit (INDEPENDENT_AMBULATORY_CARE_PROVIDER_SITE_OTHER): Payer: Medicare PPO | Admitting: Nurse Practitioner

## 2022-07-15 VITALS — BP 128/76 | HR 87 | Temp 97.9°F | Ht 65.25 in | Wt 181.6 lb

## 2022-07-15 DIAGNOSIS — N182 Chronic kidney disease, stage 2 (mild): Secondary | ICD-10-CM | POA: Diagnosis not present

## 2022-07-15 DIAGNOSIS — N183 Chronic kidney disease, stage 3 unspecified: Secondary | ICD-10-CM

## 2022-07-15 DIAGNOSIS — E559 Vitamin D deficiency, unspecified: Secondary | ICD-10-CM

## 2022-07-15 DIAGNOSIS — Z79899 Other long term (current) drug therapy: Secondary | ICD-10-CM

## 2022-07-15 DIAGNOSIS — E039 Hypothyroidism, unspecified: Secondary | ICD-10-CM | POA: Diagnosis not present

## 2022-07-15 DIAGNOSIS — E663 Overweight: Secondary | ICD-10-CM

## 2022-07-15 DIAGNOSIS — R0989 Other specified symptoms and signs involving the circulatory and respiratory systems: Secondary | ICD-10-CM | POA: Diagnosis not present

## 2022-07-15 DIAGNOSIS — Z23 Encounter for immunization: Secondary | ICD-10-CM | POA: Diagnosis not present

## 2022-07-15 DIAGNOSIS — R6889 Other general symptoms and signs: Secondary | ICD-10-CM

## 2022-07-15 DIAGNOSIS — E782 Mixed hyperlipidemia: Secondary | ICD-10-CM

## 2022-07-15 DIAGNOSIS — F419 Anxiety disorder, unspecified: Secondary | ICD-10-CM

## 2022-07-15 DIAGNOSIS — T7840XD Allergy, unspecified, subsequent encounter: Secondary | ICD-10-CM

## 2022-07-15 DIAGNOSIS — Z0000 Encounter for general adult medical examination without abnormal findings: Secondary | ICD-10-CM

## 2022-07-15 DIAGNOSIS — Z0001 Encounter for general adult medical examination with abnormal findings: Secondary | ICD-10-CM | POA: Diagnosis not present

## 2022-07-15 DIAGNOSIS — S41111A Laceration without foreign body of right upper arm, initial encounter: Secondary | ICD-10-CM

## 2022-07-15 DIAGNOSIS — I77819 Aortic ectasia, unspecified site: Secondary | ICD-10-CM

## 2022-07-15 MED ORDER — LEVOTHYROXINE SODIUM 125 MCG PO TABS
ORAL_TABLET | ORAL | 0 refills | Status: DC
Start: 2022-07-15 — End: 2022-08-19

## 2022-07-15 NOTE — Patient Instructions (Signed)

## 2022-07-16 ENCOUNTER — Encounter: Payer: Self-pay | Admitting: Nurse Practitioner

## 2022-07-16 LAB — LIPID PANEL
Cholesterol: 164 mg/dL (ref <200)
HDL: 74 mg/dL (ref >=50)
LDL Cholesterol (Calc): 75 mg/dL (calc)
Non-HDL Cholesterol (Calc): 90 mg/dL (calc) (ref <130)
Total CHOL/HDL Ratio: 2.2 (calc) (ref <5.0)
Triglycerides: 73 mg/dL (ref <150)

## 2022-07-16 LAB — COMPLETE METABOLIC PANEL WITH GFR
AG Ratio: 2.2 (calc) (ref 1.0–2.5)
ALT: 14 U/L (ref 6–29)
AST: 19 U/L (ref 10–35)
Albumin: 4.7 g/dL (ref 3.6–5.1)
Alkaline phosphatase (APISO): 50 U/L (ref 37–153)
BUN/Creatinine Ratio: 15 (calc) (ref 6–22)
BUN: 16 mg/dL (ref 7–25)
CO2: 27 mmol/L (ref 20–32)
Calcium: 9.8 mg/dL (ref 8.6–10.4)
Chloride: 104 mmol/L (ref 98–110)
Creat: 1.08 mg/dL — ABNORMAL HIGH (ref 0.60–1.00)
Globulin: 2.1 g/dL (calc) (ref 1.9–3.7)
Glucose, Bld: 86 mg/dL (ref 65–99)
Potassium: 4 mmol/L (ref 3.5–5.3)
Sodium: 141 mmol/L (ref 135–146)
Total Bilirubin: 0.7 mg/dL (ref 0.2–1.2)
Total Protein: 6.8 g/dL (ref 6.1–8.1)
eGFR: 54 mL/min/{1.73_m2} — ABNORMAL LOW (ref >=60)

## 2022-07-16 LAB — CBC WITH DIFFERENTIAL/PLATELET
Absolute Monocytes: 311 cells/uL (ref 200–950)
Basophils Absolute: 20 cells/uL (ref 0–200)
Basophils Relative: 0.4 %
Eosinophils Absolute: 61 cells/uL (ref 15–500)
Eosinophils Relative: 1.2 %
HCT: 43.5 % (ref 35.0–45.0)
Hemoglobin: 14.7 g/dL (ref 11.7–15.5)
Lymphs Abs: 1000 cells/uL (ref 850–3900)
MCH: 33 pg (ref 27.0–33.0)
MCHC: 33.8 g/dL (ref 32.0–36.0)
MCV: 97.8 fL (ref 80.0–100.0)
MPV: 9.6 fL (ref 7.5–12.5)
Monocytes Relative: 6.1 %
Neutro Abs: 3708 cells/uL (ref 1500–7800)
Neutrophils Relative %: 72.7 %
Platelets: 289 10*3/uL (ref 140–400)
RBC: 4.45 10*6/uL (ref 3.80–5.10)
RDW: 12.9 % (ref 11.0–15.0)
Total Lymphocyte: 19.6 %
WBC: 5.1 10*3/uL (ref 3.8–10.8)

## 2022-07-16 LAB — MAGNESIUM: Magnesium: 2.3 mg/dL (ref 1.5–2.5)

## 2022-07-16 LAB — TSH: TSH: 0.28 mIU/L — ABNORMAL LOW (ref 0.40–4.50)

## 2022-07-16 LAB — VITAMIN D 25 HYDROXY (VIT D DEFICIENCY, FRACTURES): Vit D, 25-Hydroxy: 65 ng/mL (ref 30–100)

## 2022-07-23 DIAGNOSIS — M25551 Pain in right hip: Secondary | ICD-10-CM | POA: Diagnosis not present

## 2022-07-23 DIAGNOSIS — R269 Unspecified abnormalities of gait and mobility: Secondary | ICD-10-CM | POA: Diagnosis not present

## 2022-07-29 ENCOUNTER — Other Ambulatory Visit (HOSPITAL_COMMUNITY): Payer: Self-pay

## 2022-07-29 ENCOUNTER — Ambulatory Visit
Admission: RE | Admit: 2022-07-29 | Discharge: 2022-07-29 | Disposition: A | Discharge status: Home / Self Care | Payer: Medicare PPO | Source: Ambulatory Visit | Attending: Thoracic Surgery (Cardiothoracic Vascular Surgery) | Admitting: Thoracic Surgery (Cardiothoracic Vascular Surgery)

## 2022-07-29 DIAGNOSIS — I719 Aortic aneurysm of unspecified site, without rupture: Secondary | ICD-10-CM | POA: Diagnosis not present

## 2022-07-29 DIAGNOSIS — I7121 Aneurysm of the ascending aorta, without rupture: Secondary | ICD-10-CM

## 2022-07-29 DIAGNOSIS — I712 Thoracic aortic aneurysm, without rupture, unspecified: Secondary | ICD-10-CM | POA: Diagnosis not present

## 2022-07-29 MED ORDER — IOPAMIDOL (ISOVUE-370) INJECTION 76%
75.0000 mL | Freq: Once | INTRAVENOUS | Status: AC | PRN
  Administered 2022-07-29: 75 mL via INTRAVENOUS

## 2022-08-06 ENCOUNTER — Encounter: Payer: Self-pay | Admitting: Thoracic Surgery (Cardiothoracic Vascular Surgery)

## 2022-08-06 ENCOUNTER — Other Ambulatory Visit: Payer: Self-pay

## 2022-08-06 ENCOUNTER — Ambulatory Visit: Payer: Medicare PPO | Admitting: Thoracic Surgery (Cardiothoracic Vascular Surgery)

## 2022-08-06 VITALS — BP 137/96 | HR 94 | Resp 18 | Ht 65.0 in | Wt 182.0 lb

## 2022-08-06 DIAGNOSIS — I7121 Aneurysm of the ascending aorta, without rupture: Secondary | ICD-10-CM

## 2022-08-06 DIAGNOSIS — E782 Mixed hyperlipidemia: Secondary | ICD-10-CM

## 2022-08-06 MED ORDER — ROSUVASTATIN CALCIUM 5 MG PO TABS
ORAL_TABLET | ORAL | 3 refills | Status: AC
Start: 2022-08-06 — End: ?

## 2022-08-06 NOTE — Progress Notes (Signed)
[address]       [place] [address]             [phone]    HPI: Ms. Kimberly Bowers returns for a scheduled follow-up visit regarding her ascending aneurysm.  Kimberly Bowers is a 72 year old woman with a history of hypertension, morbid obesity, chronic kidney disease, mixed hyperlipidemia, hypothyroidism, and an ascending aortic aneurysm.  First noted to have ascending thoracic aortic dilatation on echocardiogram done to evaluate for palpitations.  On follow-up remained stable.  Had a CT of the chest in May 2023 which showed a 4 to 4.1 cm ascending aneurysm.  Now returns for a 1 year follow-up.  In the interim she has been feeling well.  She is lost a significant amount of weight on Wegovy.  Occasional reflux symptoms relieved with Tums.  No anginal type chest pain or shortness of breath.  Past Medical History:  Diagnosis Date   Allergy    Anxiety    Arthritis    CKD (chronic kidney disease) stage 3, GFR 30-59 ml/min (HCC) 01/11/2020   Diverticulitis 2004   Hypothyroid    Labile hypertension 02/07/2021   Neutropenia (HCC)     Current Outpatient Medications  Medication Sig Dispense Refill   Acetaminophen (TYLENOL ARTHRITIS EXT RELIEF PO) Take by mouth.     B Complex Vitamins (B COMPLEX PO) Take by mouth.     Cholecalciferol (VITAMIN D) 2000 UNITS tablet Take 2,000 Units by mouth daily.     diclofenac Sodium (VOLTAREN) 1 % GEL Apply 4 grams topically 4 times daily 100 g 2   fexofenadine (ALLEGRA) 60 MG tablet Take 60 mg by mouth daily as needed for allergies or rhinitis.     levothyroxine (SYNTHROID) 125 MCG tablet take 1 tablet by mouth once a day, except Sunday take 1/2 on empty stomach with only water for 30 minutes, no antacids, calcium, magnesium for 4 hours and avoid biotin 90 tablet 0   MAGNESIUM PO Take by mouth daily.     Omega-3 Fatty Acids (OMEGA 3 PO) Take by mouth.     Semaglutide-Weight Management (WEGOVY) 2.4 MG/0.75ML SOAJ Inject 2.4 mg into the  skin once a week. 3 mL 2   Wheat Dextrin (BENEFIBER) POWD Take by mouth 2 (two) times daily.     rosuvastatin (CRESTOR) 5 MG tablet Take 1 tab daily to reduce risk of heart attack and stroke. 90 tablet 3   No current facility-administered medications for this visit.    Physical Exam BP (!) 137/96 (BP Location: Left Arm, Patient Position: Sitting)   Pulse 94   Resp 18   Ht 5\' 5"  (1.651 m)   Wt 182 lb (82.6 kg)   LMP 03/11/2000   SpO2 98% Comment: RA  BMI 30.76 kg/m  72 year old woman in no acute distress Alert and oriented x 3 with no focal deficits Lungs clear with equal breath sounds bilaterally No carotid bruits Cardiac regular rate and rhythm with normal S1 and S2 with no rubs or gallops No peripheral edema  Diagnostic Tests: CT ANGIOGRAPHY CHEST WITH CONTRAST   TECHNIQUE: Multidetector CT imaging of the chest was performed using the standard protocol during bolus administration of intravenous contrast. Multiplanar CT image reconstructions and MIPs were obtained to evaluate the vascular anatomy.   RADIATION DOSE REDUCTION: This exam was performed according to the departmental dose-optimization program which includes automated exposure control, adjustment of the mA and/or kV according to patient size and/or use of iterative  reconstruction technique.   CONTRAST:  75mL ISOVUE-370 IOPAMIDOL (ISOVUE-370) INJECTION 76%   COMPARISON:  Jul 19, 2021.   FINDINGS: Cardiovascular: Grossly stable 4.0 cm ascending thoracic aortic aneurysm is noted. No dissection is noted. Great vessels are widely patent without significant stenosis. Mild cardiomegaly. No pericardial effusion.   Mediastinum/Nodes: Small sliding-type hiatal hernia. No adenopathy. Thyroid gland is not well visualized.   Lungs/Pleura: Lungs are clear. No pleural effusion or pneumothorax.   Upper Abdomen: Stable hepatic cysts.   Musculoskeletal: No chest wall abnormality. No acute or significant osseous  findings.   Review of the MIP images confirms the above findings.   IMPRESSION: Grossly stable 4.0 cm ascending thoracic aortic aneurysm. Recommend annual imaging followup by CTA or MRA. This recommendation follows 2010 ACCF/AHA/AATS/ACR/ASA/SCA/SCAI/SIR/STS/SVM Guidelines for the Diagnosis and Management of Patients with Thoracic Aortic Disease. Circulation. 2010; 121: Z610-R604. Aortic aneurysm NOS (ICD10-I71.9).   Small sliding-type hiatal hernia.     Electronically Signed   By: Lupita Raider M.D.   On: 07/29/2022 12:23 I personally reviewed the CT images.  Stable 4 cm ascending aortic aneurysm.  Small hiatal hernia noted by radiology.  Impression: Kimberly Bowers is a 72 year old woman with a history of hypertension, morbid obesity, chronic kidney disease, mixed hyperlipidemia, hypothyroidism, and an ascending aortic aneurysm.  Ascending aneurysm-stable at about 4 cm.  Needs continued annual follow-up.  Importance of blood pressure control emphasized.  Hypertension-blood pressure mildly elevated today.  She took it at home this morning and it was about 110 systolic.  Normally runs a little high when she sees healthcare professionals consistent with whitecoat hypertension.  Hyperlipidemia-on Crestor.  Hiatal hernia-she noted the hiatal hernia mentioned in the radiology report which had not been previously mentioned.  In retrospect it was there.  Small with minimal symptoms.  Plan: Return in 1 year with CT chest.  Will plan to do without contrast given her very mild renal dysfunction.  Loreli Slot, MD [facility] [phone]

## 2022-08-17 ENCOUNTER — Other Ambulatory Visit: Payer: Self-pay | Admitting: Nurse Practitioner

## 2022-08-17 DIAGNOSIS — E039 Hypothyroidism, unspecified: Secondary | ICD-10-CM

## 2022-08-28 ENCOUNTER — Other Ambulatory Visit (HOSPITAL_COMMUNITY): Payer: Self-pay

## 2022-08-28 ENCOUNTER — Other Ambulatory Visit: Payer: Self-pay | Admitting: Nurse Practitioner

## 2022-08-28 MED ORDER — WEGOVY 2.4 MG/0.75ML ~~LOC~~ SOAJ
2.4000 mg | SUBCUTANEOUS | 2 refills | Status: DC
Start: 2022-08-28 — End: 2022-11-19
  Filled 2022-08-28: qty 3, 28d supply, fill #0
  Filled 2022-09-23: qty 3, 28d supply, fill #1
  Filled 2022-10-20: qty 3, 28d supply, fill #2

## 2022-09-10 ENCOUNTER — Encounter: Payer: Self-pay | Admitting: Gastroenterology

## 2022-09-23 ENCOUNTER — Other Ambulatory Visit (HOSPITAL_COMMUNITY): Payer: Self-pay

## 2022-09-26 DIAGNOSIS — M1712 Unilateral primary osteoarthritis, left knee: Secondary | ICD-10-CM | POA: Diagnosis not present

## 2022-10-01 ENCOUNTER — Encounter: Payer: Self-pay | Admitting: Nurse Practitioner

## 2022-10-17 NOTE — Progress Notes (Signed)
Assessment and Plan:  Kimberly Bowers was seen today for thyroid problem.  Diagnoses and all orders for this visit:  Labile Hypertension - currently controlled without medication.  Continue DASH diet, exercise and monitor at home. Call if greater than 130/80.    Hypothyroidism, unspecified type continue medications the same pending lab results reminded to take on an empty stomach 30-3mins before food.  check TSH level -     TSH  Overweight Long discussion about weight loss, diet, and exercise Recommended diet heavy in fruits and veggies and low in animal meats, cheeses, and dairy products, appropriate calorie intake Patient will work on decreasing saturated fats and simple carbs.  Increase lean protein and activity Continue Wegovy 2.4 mg SQ QW Follow up at next visit   Stage 3 chronic kidney disease, unspecified whether stage 3a or 3b CKD (HCC) Increase fluids, avoid NSAIDS, monitor sugars, will monitor -     CBC with Differential/Platelet -     COMPLETE METABOLIC PANEL WITH GFR  Mixed hyperlipidemia Continue Crestor 5 mg every day Continue to limit saturated fats and increase activity  Preop exam for internal medicine -     CBC with Differential/Platelet -     COMPLETE METABOLIC PANEL WITH GFR -     TSH -     Lipid panel -     EKG 12-Lead  Medication management -     CBC with Differential/Platelet -     COMPLETE METABOLIC PANEL WITH GFR -     TSH -     Lipid panel -     EKG 12-Lead      Further disposition pending results of labs. Discussed med's effects and SE's.   Over 30 minutes of exam, counseling, chart review, and critical decision making was performed.   Future Appointments  Date Time Provider Department Center  11/15/2022 10:00 AM [facility] PREVISIT RM 52 [facility]  12/02/2022  9:00 AM Raynelle Dick, NP [facility] None  12/13/2022  9:00 AM Napoleon Form, MD [facility]  05/29/2023  9:00 AM Raynelle Dick, NP [facility] None     ------------------------------------------------------------------------------------------------------------------   HPI BP 118/84   Pulse 98   Temp (!) 97.5 F (36.4 C)   Ht 5' 5.25" (1.657 m)   Wt 181 lb 6.4 oz (82.3 kg)   LMP 03/11/2000   SpO2 97%   BMI 29.96 kg/m  72 y.o.female who presents for preop exam for Left TKA  She is here for preop exam for Left Total Knee Arthroplasty in October or early November. She has pain in left knee that is weak and painful and walking is difficult. Right knee has some arthritis but no issues.   She has occasional constipation and uses Dr. Quenton Fetter formula one and it controls symptoms   She is taking 125 mg whole tab daily except 1/2 tab Sunday and no pill on Wednesday, didn't change dose last visit.  Lab Results  Component Value Date   TSH 0.28 (L) 07/15/2022   BMI is Body mass index is 29.96 kg/m., she has been working on diet and exercise, has added free weights 3 days a week,PT exercises, recumbent bike use. On wegovy 2.4 mg/week. Wt Readings from Last 3 Encounters:  10/21/22 181 lb 6.4 oz (82.3 kg)  08/06/22 182 lb (82.6 kg)  07/15/22 181 lb 9.6 oz (82.4 kg)   BP well controlled without medication BP Readings from Last 3 Encounters:  10/21/22 118/84  08/06/22 (!) 137/96  07/15/22 128/76  Denies  headaches, chest pain, shortness of breath and dizziness    Her cholesterol is controlled with rosuvastatin 5 mg daily Lab Results  Component Value Date   CHOL 164 07/15/2022   HDL 74 07/15/2022   LDLCALC 75 07/15/2022   TRIG 73 07/15/2022   CHOLHDL 2.2 07/15/2022   Kidney functions in stage 3 a- she is trying to push fluids throughout the day and avoid NSAIDS Lab Results  Component Value Date   EGFR 54 (L) 07/15/2022    Past Medical History:  Diagnosis Date   Allergy    Anxiety    Arthritis    CKD (chronic kidney disease) stage 3, GFR 30-59 ml/min (HCC) 01/11/2020   Diverticulitis 2004   Hypothyroid    Labile  hypertension 02/07/2021   Neutropenia (HCC)      Allergies  Allergen Reactions   Quinolones     Aortic aneurysm    Current Outpatient Medications on File Prior to Visit  Medication Sig   Acetaminophen (TYLENOL ARTHRITIS EXT RELIEF PO) Take by mouth.   B Complex Vitamins (B COMPLEX PO) Take by mouth.   Cholecalciferol (VITAMIN D) 2000 UNITS tablet Take 2,000 Units by mouth daily.   fexofenadine (ALLEGRA) 60 MG tablet Take 60 mg by mouth daily as needed for allergies or rhinitis.   levothyroxine (SYNTHROID) 125 MCG tablet TAKE 1 TABLET BY MOUTH ONCE A DAY ON AN EMPTY STOMACH WITH ONLY WATER FOR 30 MINUTES, NO ANTACIDS, CALCIUM, MAGNESIUM FOR 4 HOURS & AVOID BIOTIN   MAGNESIUM PO Take by mouth daily.   Omega-3 Fatty Acids (OMEGA 3 PO) Take by mouth.   rosuvastatin (CRESTOR) 5 MG tablet Take 1 tab daily to reduce risk of heart attack and stroke.   Semaglutide-Weight Management (WEGOVY) 2.4 MG/0.75ML SOAJ Inject 2.4 mg into the skin once a week.   Wheat Dextrin (BENEFIBER) POWD Take by mouth 2 (two) times daily.   diclofenac Sodium (VOLTAREN) 1 % GEL Apply 4 grams topically 4 times daily (Patient not taking: Reported on 10/21/2022)   No current facility-administered medications on file prior to visit.    ROS: all negative except above.   Physical Exam:  BP 118/84   Pulse 98   Temp (!) 97.5 F (36.4 C)   Ht 5' 5.25" (1.657 m)   Wt 181 lb 6.4 oz (82.3 kg)   LMP 03/11/2000   SpO2 97%   BMI 29.96 kg/m   General Appearance: Well nourished, in no apparent distress. Eyes: PERRLA, EOMs, conjunctiva no swelling or erythema Sinuses: No Frontal/maxillary tenderness ENT/Mouth: Ext aud canals clear, TMs without erythema, bulging. No erythema, swelling, or exudate on post pharynx.  Hearing normal.  Neck: Supple, thyroid normal.  Respiratory: Respiratory effort normal, BS equal bilaterally without rales, rhonchi, wheezing or stridor.  Cardio: RRR with no MRGs. Rare skipped beat. Brisk  peripheral pulses without edema.  Abdomen: Soft, + BS.  Non tender, no guarding, rebound, hernias, masses. Lymphatics: Non tender without lymphadenopathy.  Musculoskeletal: Full ROM, 5/5 except left lower extremity 4/5, crepitus of left knee with straight leg raise Skin: Warm, dry without rashes, lesions, ecchymosis.  Neuro: Cranial nerves intact. Normal muscle tone, no cerebellar symptoms. Sensation intact.  Psych: Awake and oriented X 3, normal affect, Insight and Judgment appropriate.  EKG: NSR, no ST changes   Hollie Salk, NP 12:07 PM [facility]

## 2022-10-21 ENCOUNTER — Encounter: Payer: Self-pay | Admitting: Nurse Practitioner

## 2022-10-21 ENCOUNTER — Other Ambulatory Visit: Payer: Self-pay

## 2022-10-21 ENCOUNTER — Ambulatory Visit: Payer: Medicare PPO | Admitting: Nurse Practitioner

## 2022-10-21 VITALS — BP 118/84 | HR 98 | Temp 97.5°F | Ht 65.25 in | Wt 181.4 lb

## 2022-10-21 DIAGNOSIS — Z79899 Other long term (current) drug therapy: Secondary | ICD-10-CM | POA: Diagnosis not present

## 2022-10-21 DIAGNOSIS — E663 Overweight: Secondary | ICD-10-CM

## 2022-10-21 DIAGNOSIS — E782 Mixed hyperlipidemia: Secondary | ICD-10-CM | POA: Diagnosis not present

## 2022-10-21 DIAGNOSIS — Z01818 Encounter for other preprocedural examination: Secondary | ICD-10-CM | POA: Diagnosis not present

## 2022-10-21 DIAGNOSIS — E039 Hypothyroidism, unspecified: Secondary | ICD-10-CM

## 2022-10-21 DIAGNOSIS — N183 Chronic kidney disease, stage 3 unspecified: Secondary | ICD-10-CM | POA: Diagnosis not present

## 2022-10-21 DIAGNOSIS — R0989 Other specified symptoms and signs involving the circulatory and respiratory systems: Secondary | ICD-10-CM

## 2022-10-21 LAB — CBC WITH DIFFERENTIAL/PLATELET
Absolute Monocytes: 318 cells/uL (ref 200–950)
Basophils Absolute: 30 cells/uL (ref 0–200)
Basophils Relative: 0.7 %
Eosinophils Absolute: 52 cells/uL (ref 15–500)
Eosinophils Relative: 1.2 %
HCT: 43.4 % (ref 35.0–45.0)
Hemoglobin: 14.3 g/dL (ref 11.7–15.5)
Lymphs Abs: 1161 cells/uL (ref 850–3900)
MCH: 32.8 pg (ref 27.0–33.0)
MCHC: 32.9 g/dL (ref 32.0–36.0)
MCV: 99.5 fL (ref 80.0–100.0)
MPV: 9.6 fL (ref 7.5–12.5)
Monocytes Relative: 7.4 %
Neutro Abs: 2739 cells/uL (ref 1500–7800)
Neutrophils Relative %: 63.7 %
Platelets: 276 10*3/uL (ref 140–400)
RBC: 4.36 10*6/uL (ref 3.80–5.10)
RDW: 11.4 % (ref 11.0–15.0)
Total Lymphocyte: 27 %
WBC: 4.3 10*3/uL (ref 3.8–10.8)

## 2022-10-21 NOTE — Patient Instructions (Signed)

## 2022-10-24 ENCOUNTER — Encounter: Payer: Self-pay | Admitting: Nurse Practitioner

## 2022-11-15 ENCOUNTER — Ambulatory Visit (AMBULATORY_SURGERY_CENTER): Payer: Medicare PPO | Admitting: *Deleted

## 2022-11-15 VITALS — Ht 65.25 in | Wt 180.0 lb

## 2022-11-15 DIAGNOSIS — Z86010 Personal history of colonic polyps: Secondary | ICD-10-CM

## 2022-11-15 MED ORDER — NA SULFATE-K SULFATE-MG SULF 17.5-3.13-1.6 GM/177ML PO SOLN
1.0000 | Freq: Once | ORAL | 0 refills | Status: AC
Start: 2022-11-15 — End: 2022-11-15

## 2022-11-15 NOTE — Progress Notes (Signed)
Pt's name and DOB verified at the beginning of the pre-visit.  Pt denies any difficulty with ambulating,sitting, laying down or rolling side to side Gave both [facility] main # and MD on call # prior to instructions.  No egg or soy allergy known to patient  No issues known to pt with past sedation with any surgeries or procedures Pt denies having issues being intubated Patient denies ever being intubated Pt has no issues moving head neck or swallowing No FH of Malignant Hyperthermia Pt is not on diet pills Pt is not on home 02  Pt is not on blood thinners  Pt has frequent issues with constipation RN instructed pt to use Miralax per bottles instructions a week before prep days. Pt states they will Pt is not on dialysis Pt denise any abnormal heart rhythms  Pt denies any upcoming cardiac testing Pt encouraged to use to use Singlecare or Goodrx to reduce cost  Patient's chart reviewed by Cathlyn Parsons CNRA prior to pre-visit and patient appropriate for the [facility].  Pre-visit completed and red dot placed by patient's name on their procedure day (on provider's schedule).  . Visit in person Scale weight is 180lb Instructed pt why it is important to and  to call if they have any changes in health or new medications. Directed them to the # given and on instructions.   Pt states they will.  Instructions reviewed with pt and pt states understanding. Instructed to review again prior to procedure. Pt states they will.  Instructions given to pt  with coupon and by my chart

## 2022-11-17 ENCOUNTER — Other Ambulatory Visit: Payer: Self-pay | Admitting: Nurse Practitioner

## 2022-11-17 DIAGNOSIS — E039 Hypothyroidism, unspecified: Secondary | ICD-10-CM

## 2022-11-19 ENCOUNTER — Other Ambulatory Visit (HOSPITAL_COMMUNITY): Payer: Self-pay

## 2022-11-19 ENCOUNTER — Other Ambulatory Visit: Payer: Self-pay | Admitting: Nurse Practitioner

## 2022-11-19 MED ORDER — WEGOVY 2.4 MG/0.75ML ~~LOC~~ SOAJ
2.4000 mg | SUBCUTANEOUS | 2 refills | Status: DC
Start: 2022-11-19 — End: 2023-03-10
  Filled 2022-11-19: qty 3, 28d supply, fill #0
  Filled 2023-01-13: qty 3, 28d supply, fill #1
  Filled 2023-02-06: qty 3, 28d supply, fill #2

## 2022-11-20 ENCOUNTER — Other Ambulatory Visit (HOSPITAL_COMMUNITY): Payer: Self-pay

## 2022-11-28 NOTE — Progress Notes (Signed)
CPE AND FOLLOW UP  Assessment:   Encounter for Annual Physical Exam with abnormal findings Due annually  Health Maintenance reviewed Healthy lifestyle reviewed and goals set  Aortic dilatation [facility]) Following with cardiology Had echo 2022 and CT aorta 07/19/21 Control BP    Obesity - BMI 30 + hld, knee arthritis Lack of benefit with wellbutrin, naltrexone, phentermine- Continue Wegovy Discussed diet, will restart intermittent fasting, will increase fiber and protein. High fiber diet handout given, goals set for exercise   Mixed hyperlipidemia Continue Rosuvastatin 5 mg every day, at goal last month decrease fatty foods increase activity.   CKD 2 Increase fluids, avoid NSAIDS, monitor sugars, will monitor -  UA, microalbumin  Medication management Continued -TSH  Abnormal Glucose Continue diet and exercise A!C 4.8 last month  Vitamin D deficiency Continue Vit D supplementation to maintain value in therapeutic level of 60-100   Hypothyroidism, unspecified type -     TSH Hypothyroidism-check TSH level, continue medications the same, reminded to take on an empty stomach 30-19mins before food.  Feeling better on the 125 mcg- will recheck  Allergic state, subsequent encounter Continue to monitor  Anxiety Monitor  Arthritis of left knee Scheduled for TKA 01/06/23 Monitor symptoms  Screening for hematuria/proteinuria - UA with reflex microcopic - Microalbumin/creatinine urine ratio     Future Appointments  Date Time Provider Department Center  12/13/2022  9:00 AM Napoleon Form, MD [facility]  12/24/2022 11:00 AM [facility] PAT 1 [facility] None  05/29/2023  9:00 AM Raynelle Dick, NP [facility] None  12/02/2023  9:00 AM Raynelle Dick, NP [facility] None     Subjective:   Kimberly Bowers is a 72 y.o. female who presents for CPE and 3 month follow up on hypertension, hypothyroidism, obesity, hyperlipidemia, vitamin D def. She has Anxiety;  Hypothyroid; Allergy; Mixed hyperlipidemia; Morbid obesity (HCC); Vitamin D deficiency; Medication management; Aortic dilatation (HCC); Gallstones; Arthritis of left knee; Labile hypertension; and CKD (chronic kidney disease) stage 2, GFR 60-89 ml/min on their problem list.  She is married, 2 grown sons, 2 grandchildren. Retired Nurse, learning disability.   Left knee arthritis, intermittent back pain, follows with [facility] - Dr. Cleophas Dunker,  has cortisone shots and doing strengthening exercises. Has TKA scheduled for 01/06/23  Colonoscopy next week- off Wegovy starting today.   BMI is Body mass index is 30.09 kg/m., she is working on diet and exercise, walking, doing free weights and recumbent bike. She does have some issues with her right knee- follows with orthopedics  She is currently using Wegovy 2.4 mg SQ QW. No side effects from medication Following mediterranean diet. She uses benefiber and herbal digestive supplement- Formula One Wt Readings from Last 3 Encounters:  12/02/22 182 lb 3.2 oz (82.6 kg)  11/15/22 180 lb (81.6 kg)  10/21/22 181 lb 6.4 oz (82.3 kg)   Echo 12/26/20  1. Left ventricular ejection fraction, by estimation, is 55 to 60%. Left  ventricular ejection fraction by 3D volume is 55 %. The left ventricle has  normal function. The left ventricle has no regional wall motion  abnormalities. There is mild concentric  left ventricular hypertrophy. Left ventricular diastolic parameters are  consistent with Grade I diastolic dysfunction (impaired relaxation).   2. Right ventricular systolic function is normal. The right ventricular  size is normal. There is normal pulmonary artery systolic pressure. The  estimated right ventricular systolic pressure is 24.3 mmHg.   3. The mitral valve is grossly normal. Mild mitral valve regurgitation.  No evidence of mitral stenosis.   4. The aortic valve is tricuspid. Aortic valve regurgitation is not  visualized. No aortic stenosis is present.    5. Aortic dilatation noted. There is mild dilatation of the ascending  aorta, measuring 41 mm.   6. The inferior vena cava is normal in size with greater than 50%  respiratory variability, suggesting right atrial pressure of 3 mmHg.  Her blood pressure has been controlled at home- higher first thing in the morning 140/85-90- later in the day runs 110/75, today their BP is BP: 124/88  BP Readings from Last 3 Encounters:  12/02/22 124/88  10/21/22 118/84  08/06/22 (!) 137/96  She does workout, she is walking daily, doing free weights.  She denies chest pain, shortness of breath, dizziness.    She is on cholesterol medication, Rosuvastatin 5 mg every day ,  and denies myalgias. Her cholesterol is not at goal. The cholesterol last visit was:   Lab Results  Component Value Date   CHOL 170 10/21/2022   HDL 70 10/21/2022   LDLCALC 81 10/21/2022   TRIG 95 10/21/2022   CHOLHDL 2.4 10/21/2022   Last N8G in the office was:  Lab Results  Component Value Date   HGBA1C 4.8 11/28/2021   She has CKD II/III, stable, monitored at this office; normal renal ultrasound in 11/2019, did incidentally show gallstones, has stopped diclofenac oral, takes rarely PRN. She is drinking lots of fluid throughout the day  Lab Results  Component Value Date   EGFR 60 10/21/2022   Lab Results  Component Value Date   MICROALBUR 0.9 12/27/2021   MICROALBUR 14.1 11/28/2021      Patient is on Vitamin D supplement, 2000 IU daily.  Lab Results  Component Value Date   VD25OH 20 07/15/2022     She is on thyroid medication. Her medication was not changed last visit. She is on every day except 1/2 tab on Sunday..  She is not on a biotin.  Lab Results  Component Value Date   TSH 1.23 10/21/2022      Medication Review  Current Outpatient Medications (Endocrine & Metabolic):    levothyroxine (SYNTHROID) 125 MCG tablet, Take 1 tablet by mouth once a day on an empty stomach with only water for 30  minutes, no antacids, calcium, magnesium for 4 hours & avoid biotin  Current Outpatient Medications (Cardiovascular):    rosuvastatin (CRESTOR) 5 MG tablet, Take 1 tab daily to reduce risk of heart attack and stroke.  Current Outpatient Medications (Respiratory):    fexofenadine (ALLEGRA) 60 MG tablet, Take 60 mg by mouth daily as needed for allergies or rhinitis.  Current Outpatient Medications (Analgesics):    Acetaminophen (TYLENOL ARTHRITIS EXT RELIEF PO), Take by mouth.   Current Outpatient Medications (Other):    B Complex Vitamins (B COMPLEX PO), Take by mouth.   Cholecalciferol (VITAMIN D) 2000 UNITS tablet, Take 2,000 Units by mouth daily.   MAGNESIUM PO, Take by mouth daily.   Omega-3 Fatty Acids (OMEGA 3 PO), Take by mouth.   Semaglutide-Weight Management (WEGOVY) 2.4 MG/0.75ML SOAJ, Inject 2.4 mg into the skin once a week.   Wheat Dextrin (BENEFIBER) POWD, Take by mouth 2 (two) times daily.  Current Problems (verified) Patient Active Problem List   Diagnosis Date Noted   CKD (chronic kidney disease) stage 2, GFR 60-89 ml/min 07/12/2021   Labile hypertension 02/07/2021   Arthritis of left knee 07/12/2020   Gallstones 01/10/2020   Aortic dilatation (HCC) 10/13/2019  Morbid obesity (HCC) 03/17/2014   Vitamin D deficiency 03/17/2014   Medication management 03/17/2014   Mixed hyperlipidemia 03/25/2013   Anxiety    Hypothyroid    Allergy     Screening Tests Immunization History  Administered Date(s) Administered   Influenza Split 12/08/2013   Influenza, High Dose Seasonal PF 11/19/2014, 11/01/2016, 12/19/2017, 11/18/2018   Influenza-Unspecified 12/29/2012, 12/19/2017, 11/09/2019, 12/08/2020, 11/27/2021   Moderna Covid-19 Vaccine Bivalent Booster 72yrs & up 11/27/2021   PFIZER Comirnaty(Gray Top)Covid-19 Tri-Sucrose Vaccine 07/08/2021   PFIZER(Purple Top)SARS-COV-2 Vaccination 04/04/2019, 04/23/2019, 11/09/2019, 06/29/2020, 11/14/2020   Pneumococcal Conjugate-13  09/05/2014   Pneumococcal Polysaccharide-23 11/02/2015   Td 03/11/2004, 07/15/2022   Tdap 07/08/2012   Zoster Recombinant(Shingrix) 01/04/2019, 03/09/2019   Zoster, Live 07/08/2012    Health Maintenance  Topic Date Due   INFLUENZA VACCINE  10/10/2022   COVID-19 Vaccine (8 - 2023-24 season) 11/10/2022   Colonoscopy  11/28/2022   MAMMOGRAM  03/22/2023   Medicare Annual Wellness (AWV)  07/15/2023   DTaP/Tdap/Td (4 - Td or Tdap) 07/14/2032   Pneumonia Vaccine 41+ Years old  Completed   DEXA SCAN  Completed   Hepatitis C Screening  Completed   Zoster Vaccines- Shingrix  Completed   HPV VACCINES  Aged Out     Names of Other Physician/Practitioners you currently use: 1. [facility]- here for primary care 2. Dr. [place], eye doctor, last visit 12/2021 3. Dr. Lorelle Formosa, dentist, last visit 2024 4. Dr. [facility], derm, goes annually 02/2022   Patient Care Team: Lucky Cowboy, MD as PCP - General (Internal Medicine) Mardella Layman, MD as Consulting Physician (Gastroenterology) Salvatore Marvel, MD as Consulting Physician (Orthopedic Surgery) Jerene Bears, MD as Consulting Physician (Gynecology)   Allergies Allergies  Allergen Reactions   Quinolones     Aortic aneurysm    SURGICAL HISTORY She  has a past surgical history that includes Laparoscopic gastric banding (2004); Knee arthroscopy (Left); Uterine fibroid surgery (2001); Foot surgery (Right); Tubal ligation; Laparoscopic repair and removal of gastric band; Brachioplasty (03/2016); Cataract extraction (Bilateral, 2019); and Breast biopsy (Left, 01/2018). FAMILY HISTORY Her family history includes Cancer in her mother; Diabetes in her father; Emphysema in her father; Melanoma in her brother. SOCIAL HISTORY She  reports that she has never smoked. She has never used smokeless tobacco. She reports current alcohol use of about 1.0 - 2.0 standard drink of alcohol per week. She reports  that she does not use drugs.   Review of Systems:  Review of Systems  Constitutional: Negative.  Negative for malaise/fatigue and weight loss.  HENT: Negative.  Negative for hearing loss and tinnitus.   Eyes: Negative.  Negative for blurred vision and double vision.  Respiratory: Negative.  Negative for cough, sputum production, shortness of breath and wheezing.   Cardiovascular: Negative.  Negative for chest pain, palpitations, orthopnea, claudication, leg swelling and PND.  Gastrointestinal: Negative.  Negative for abdominal pain, blood in stool, constipation, diarrhea, heartburn, melena, nausea and vomiting.  Genitourinary: Negative.   Musculoskeletal:  Positive for back pain (SI joint pain) and joint pain (left knee TKA 01/06/23). Negative for falls and myalgias.  Skin: Negative.  Negative for rash.  Neurological: Negative.  Negative for dizziness, tingling, sensory change (.hml), weakness and headaches.  Endo/Heme/Allergies: Negative.  Negative for polydipsia.  Psychiatric/Behavioral: Negative.  Negative for depression, memory loss, substance abuse and suicidal ideas. The patient is not nervous/anxious and does not have insomnia.   All other systems reviewed and are negative.  Objective:   Today's Vitals   12/02/22 0848  BP: 124/88  Pulse: 85  Temp: 97.7 F (36.5 C)  SpO2: 99%  Weight: 182 lb 3.2 oz (82.6 kg)  Height: 5' 5.25" (1.657 m)     General appearance: alert, no distress, WD/WN,  female HEENT: normocephalic, sclerae anicteric, TMs left pearly, right dull with some fluid noted, nares patent, no discharge or erythema, pharynx normal Oral cavity: MMM, no lesions Neck: supple, no lymphadenopathy, no thyromegaly, no masses Heart: RRR, normal S1, S2, no radiation to carotids Lungs: CTA bilaterally, no wheezes, rhonchi, or rales Abdomen: +bs, soft, non tender, non distended, no masses, no hepatomegaly, no splenomegaly Musculoskeletal: nontender, no swelling, other than  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 Strength decreased in left knee  Extremities: no edema, no cyanosis, no clubbing Pulses: 2+ symmetric, upper and lower extremities, normal cap refill Neurological: alert, oriented x 3, CN2-12 intact, strength normal upper extremities and lower extremities, sensation normal throughout, DTRs 2+ throughout, no cerebellar signs, gait antalgic Psychiatric: normal affect, behavior normal, pleasant  Breasts: getting annual mammograms, denies concerns GU: defer  EKG: Defer to cardiology AAA: defer to cardiology   Raynelle Dick, NP 9:18 AM [facility]

## 2022-11-29 ENCOUNTER — Encounter: Payer: Medicare PPO | Admitting: Nurse Practitioner

## 2022-12-02 ENCOUNTER — Encounter: Payer: Self-pay | Admitting: Nurse Practitioner

## 2022-12-02 ENCOUNTER — Ambulatory Visit (INDEPENDENT_AMBULATORY_CARE_PROVIDER_SITE_OTHER): Payer: Medicare PPO | Admitting: Nurse Practitioner

## 2022-12-02 VITALS — BP 124/88 | HR 85 | Temp 97.7°F | Ht 65.25 in | Wt 182.2 lb

## 2022-12-02 DIAGNOSIS — Z0000 Encounter for general adult medical examination without abnormal findings: Secondary | ICD-10-CM

## 2022-12-02 DIAGNOSIS — E782 Mixed hyperlipidemia: Secondary | ICD-10-CM

## 2022-12-02 DIAGNOSIS — N182 Chronic kidney disease, stage 2 (mild): Secondary | ICD-10-CM

## 2022-12-02 DIAGNOSIS — R0989 Other specified symptoms and signs involving the circulatory and respiratory systems: Secondary | ICD-10-CM

## 2022-12-02 DIAGNOSIS — Z0001 Encounter for general adult medical examination with abnormal findings: Secondary | ICD-10-CM

## 2022-12-02 DIAGNOSIS — Z79899 Other long term (current) drug therapy: Secondary | ICD-10-CM

## 2022-12-02 DIAGNOSIS — E039 Hypothyroidism, unspecified: Secondary | ICD-10-CM

## 2022-12-02 DIAGNOSIS — E559 Vitamin D deficiency, unspecified: Secondary | ICD-10-CM

## 2022-12-02 DIAGNOSIS — M1712 Unilateral primary osteoarthritis, left knee: Secondary | ICD-10-CM

## 2022-12-02 DIAGNOSIS — I77819 Aortic ectasia, unspecified site: Secondary | ICD-10-CM

## 2022-12-02 DIAGNOSIS — R011 Cardiac murmur, unspecified: Secondary | ICD-10-CM

## 2022-12-02 DIAGNOSIS — E669 Obesity, unspecified: Secondary | ICD-10-CM

## 2022-12-02 NOTE — Patient Instructions (Signed)

## 2022-12-03 LAB — MICROALBUMIN / CREATININE URINE RATIO
Creatinine, Urine: 158 mg/dL (ref 20–275)
Microalb Creat Ratio: 13 mg/g creat (ref <30)
Microalb, Ur: 2 mg/dL

## 2022-12-03 LAB — URINALYSIS, ROUTINE W REFLEX MICROSCOPIC
Bilirubin Urine: NEGATIVE
Glucose, UA: NEGATIVE
Hgb urine dipstick: NEGATIVE
Hyaline Cast: NONE SEEN /LPF
Ketones, ur: NEGATIVE
Nitrite: NEGATIVE
Protein, ur: NEGATIVE
RBC / HPF: NONE SEEN /HPF (ref 0–2)
Specific Gravity, Urine: 1.026 (ref 1.001–1.035)
WBC, UA: >=60 /HPF — AB (ref 0–5)
pH: <=5 (ref 5.0–8.0)

## 2022-12-03 LAB — TSH: TSH: 1.44 mIU/L (ref 0.40–4.50)

## 2022-12-03 LAB — MICROSCOPIC MESSAGE

## 2022-12-05 ENCOUNTER — Encounter: Payer: Self-pay | Admitting: Gastroenterology

## 2022-12-11 ENCOUNTER — Encounter: Payer: Self-pay | Admitting: Certified Registered Nurse Anesthetist

## 2022-12-13 ENCOUNTER — Encounter: Payer: Self-pay | Admitting: Nurse Practitioner

## 2022-12-13 ENCOUNTER — Ambulatory Visit: Payer: Medicare PPO | Admitting: Nurse Practitioner

## 2022-12-13 ENCOUNTER — Encounter: Payer: Self-pay | Admitting: Gastroenterology

## 2022-12-13 ENCOUNTER — Ambulatory Visit: Payer: Medicare PPO | Admitting: Gastroenterology

## 2022-12-13 VITALS — BP 142/100 | HR 96 | Temp 97.6°F | Ht 65.25 in | Wt 182.2 lb

## 2022-12-13 VITALS — BP 166/115 | HR 88 | Temp 97.3°F | Ht 65.25 in | Wt 180.0 lb

## 2022-12-13 DIAGNOSIS — Z860100 Personal history of colon polyps, unspecified: Secondary | ICD-10-CM

## 2022-12-13 DIAGNOSIS — I10 Essential (primary) hypertension: Secondary | ICD-10-CM

## 2022-12-13 DIAGNOSIS — E66811 Obesity, class 1: Secondary | ICD-10-CM | POA: Diagnosis not present

## 2022-12-13 HISTORY — DX: Essential (primary) hypertension: I10

## 2022-12-13 MED ORDER — SODIUM CHLORIDE 0.9 % IV SOLN: 500.0000 mL | Freq: Once | INTRAVENOUS | Status: DC

## 2022-12-13 MED ORDER — LOSARTAN POTASSIUM 25 MG PO TABS
25.0000 mg | ORAL_TABLET | Freq: Every day | ORAL | 11 refills | Status: AC
Start: 2022-12-13 — End: 2023-12-13

## 2022-12-13 NOTE — Progress Notes (Signed)
Blood pressure significantly elevated, systolic 160-190 range and diastolic consistently greater than 110. Discussed with patient and decided to not proceed with anesthesia and colonoscopy due to history of labile hypertension and thoracic aortic aneurysm, last scan in 2022 Advised patient to have an urgent follow-up with PMD for management of elevated blood pressure and follow-up with cardiology Patient will call once she is cleared by cardiology to reschedule colonoscopy.

## 2022-12-13 NOTE — Patient Instructions (Addendum)
Start losartan 25 mg 1 tab daily  Get cuff and check BP at home daily and keep a log on a piece of paper.  If BP less than 90/55 notify the office  Return to clinic in 2 weeks and bring BP log  Go to the ER if any chest pain, shortness of breath, nausea, dizziness, severe HA, changes vision/speech   Hypertension, Adult Hypertension is another name for high blood pressure. High blood pressure forces your heart to work harder to pump blood. This can cause problems over time. There are two numbers in a blood pressure reading. There is a top number (systolic) over a bottom number (diastolic). It is best to have a blood pressure that is below 120/80. What are the causes? The cause of this condition is not known. Some other conditions can lead to high blood pressure. What increases the risk? Some lifestyle factors can make you more likely to develop high blood pressure: Smoking. Not getting enough exercise or physical activity. Being overweight. Having too much fat, sugar, calories, or salt (sodium) in your diet. Drinking too much alcohol. Other risk factors include: Having any of these conditions: Heart disease. Diabetes. High cholesterol. Kidney disease. Obstructive sleep apnea. Having a family history of high blood pressure and high cholesterol. Age. The risk increases with age. Stress. What are the signs or symptoms? High blood pressure may not cause symptoms. Very high blood pressure (hypertensive crisis) may cause: Headache. Fast or uneven heartbeats (palpitations). Shortness of breath. Nosebleed. Vomiting or feeling like you may vomit (nauseous). Changes in how you see. Very bad chest pain. Feeling dizzy. Seizures. How is this treated? This condition is treated by making healthy lifestyle changes, such as: Eating healthy foods. Exercising more. Drinking less alcohol. Your doctor may prescribe medicine if lifestyle changes do not help enough and if: Your top number is  above 130. Your bottom number is above 80. Your personal target blood pressure may vary. Follow these instructions at home: Eating and drinking  If told, follow the DASH eating plan. To follow this plan: Fill one half of your plate at each meal with fruits and vegetables. Fill one fourth of your plate at each meal with whole grains. Whole grains include whole-wheat pasta, brown rice, and whole-grain bread. Eat or drink low-fat dairy products, such as skim milk or low-fat yogurt. Fill one fourth of your plate at each meal with low-fat (lean) proteins. Low-fat proteins include fish, chicken without skin, eggs, beans, and tofu. Avoid fatty meat, cured and processed meat, or chicken with skin. Avoid pre-made or processed food. Limit the amount of salt in your diet to less than 1,500 mg each day. Do not drink alcohol if: Your doctor tells you not to drink. You are pregnant, may be pregnant, or are planning to become pregnant. If you drink alcohol: Limit how much you have to: 0-1 drink a day for women. 0-2 drinks a day for men. Know how much alcohol is in your drink. In the [place] one drink equals one 12 oz bottle of beer (355 mL), one 5 oz glass of wine (148 mL), or one 1 oz glass of hard liquor (44 mL). Lifestyle  Work with your doctor to stay at a healthy weight or to lose weight. Ask your doctor what the best weight is for you. Get at least 30 minutes of exercise that causes your heart to beat faster (aerobic exercise) most days of the week. This may include walking, swimming, or biking. Get at least  30 minutes of exercise that strengthens your muscles (resistance exercise) at least 3 days a week. This may include lifting weights or doing Pilates. Do not smoke or use any products that contain nicotine or tobacco. If you need help quitting, ask your doctor. Check your blood pressure at home as told by your doctor. Keep all follow-up visits. Medicines Take over-the-counter and prescription  medicines only as told by your doctor. Follow directions carefully. Do not skip doses of blood pressure medicine. The medicine does not work as well if you skip doses. Skipping doses also puts you at risk for problems. Ask your doctor about side effects or reactions to medicines that you should watch for. Contact a doctor if: You think you are having a reaction to the medicine you are taking. You have headaches that keep coming back. You feel dizzy. You have swelling in your ankles. You have trouble with your vision. Get help right away if: You get a very bad headache. You start to feel mixed up (confused). You feel weak or numb. You feel faint. You have very bad pain in your: Chest. Belly (abdomen). You vomit more than once. You have trouble breathing. These symptoms may be an emergency. Get help right away. Call 911. Do not wait to see if the symptoms will go away. Do not drive yourself to the hospital. Summary Hypertension is another name for high blood pressure. High blood pressure forces your heart to work harder to pump blood. For most people, a normal blood pressure is less than 120/80. Making healthy choices can help lower blood pressure. If your blood pressure does not get lower with healthy choices, you may need to take medicine. This information is not intended to replace advice given to you by your health care provider. Make sure you discuss any questions you have with your health care provider. Document Revised: 12/14/2020 Document Reviewed: 12/14/2020 Elsevier Patient Education  2024 [employer].

## 2022-12-13 NOTE — Progress Notes (Signed)
B/P 196/116, repeated after 10 minute 186/111. Told to call primary MD today and before she is schedule for knee surgery. Case canceled.

## 2022-12-13 NOTE — Progress Notes (Signed)
Assessment and Plan:  Kimberly Bowers was seen today for medication management.  Diagnoses and all orders for this visit:  Obesity (BMI 30.0-34.9) Continue diet and exercise Can restart [facility] tomorrow once she has food on her stomach- colonoscopy put off until new year  Essential hypertension Start losartan 25 mg 1 tab daily Get cuff and check BP at home daily and keep a log on a piece of paper. If BP less than 90/55 or consistently greater than 140/90 notify the office Return to clinic in 2 weeks and bring BP log Go to the ER if any chest pain, shortness of breath, nausea, dizziness, severe HA, changes vision/speech  -     losartan (COZAAR) 25 MG tablet; Take 1 tablet (25 mg total) by mouth daily.       Further disposition pending results of labs. Discussed med's effects and SE's.   Over 30 minutes of exam, counseling, chart review, and critical decision making was performed.   Future Appointments  Date Time Provider Department Center  12/24/2022 11:00 AM [facility] PAT 1 [facility] None  05/29/2023  9:00 AM Raynelle Dick, NP [facility] None  12/02/2023  9:00 AM Raynelle Dick, NP [facility] None    ------------------------------------------------------------------------------------------------------------------   HPI BP (!) 142/100   Pulse 96   Temp 97.6 F (36.4 C)   Ht 5' 5.25" (1.657 m)   Wt 182 lb 3.2 oz (82.6 kg)   LMP 03/11/2000   SpO2 99%   BMI 30.09 kg/m   72 y.o.female presents for Elevated blood pressure BP was not controlled today when she went to have colonoscopy. BP was systolic 60-190 range/ diastolic consistently greater than 110. Currently on no blood pressure medications.  BP Readings from Last 3 Encounters:  12/13/22 (!) 142/100  12/13/22 (!) 166/115  12/02/22 124/88  Denies headaches, chest pain, shortness of breath and dizziness   Has been fluctuating between stage 2 and stage 3 CKD.  Not currently on an ACE/ARB Lab Results  Component Value Date    EGFR 60 10/21/2022    BMI is Body mass index is 30.09 kg/m., she has been working on diet and exercise. Wt Readings from Last 3 Encounters:  12/13/22 182 lb 3.2 oz (82.6 kg)  12/13/22 180 lb (81.6 kg)  12/02/22 182 lb 3.2 oz (82.6 kg)    Past Medical History:  Diagnosis Date   Allergy    Aortic aneurysm, thoracic (HCC)    Mild   Arthritis    Cataract    CKD (chronic kidney disease) stage 3, GFR 30-59 ml/min (HCC) 01/11/2020   Diverticulitis 2004   Heart murmur    Just fgor couple years per pt   HTN (hypertension) 12/13/2022   B/P severe,    B/,P 160s,  DB/P running >110   Hypothyroid    Labile hypertension 02/07/2021   Neutropenia (HCC)      Allergies  Allergen Reactions   Quinolones     Aortic aneurysm    Current Outpatient Medications on File Prior to Visit  Medication Sig   acetaminophen (TYLENOL) 650 MG CR tablet Take 1,300 mg by mouth 2 (two) times daily.   B Complex Vitamins (B COMPLEX PO) Take 1 tablet by mouth daily.   Cholecalciferol (VITAMIN D) 2000 UNITS tablet Take 2,000 Units by mouth daily.   levothyroxine (SYNTHROID) 125 MCG tablet Take 1 tablet by mouth once a day on an empty stomach with only water for 30 minutes, no antacids, calcium, magnesium for 4 hours & avoid biotin  MAGNESIUM PO Take 150 mg by mouth daily.   Omega-3 Fatty Acids (OMEGA 3 500 PO) Take 500 mg by mouth daily.   rosuvastatin (CRESTOR) 5 MG tablet Take 1 tab daily to reduce risk of heart attack and stroke.   Semaglutide-Weight Management (WEGOVY) 2.4 MG/0.75ML SOAJ Inject 2.4 mg into the skin once a week.   Wheat Dextrin (BENEFIBER) POWD Take 1 Dose by mouth daily. 1 dose = 1 tablespoon   OVER THE COUNTER MEDICATION Take 1 tablet by mouth daily. Intestinal formula #1 supplement (Patient not taking: Reported on 12/13/2022)   Current Facility-Administered Medications on File Prior to Visit  Medication   0.9 %  sodium chloride infusion    ROS: all negative except above.   Physical  Exam:  BP (!) 142/100   Pulse 96   Temp 97.6 F (36.4 C)   Ht 5' 5.25" (1.657 m)   Wt 182 lb 3.2 oz (82.6 kg)   LMP 03/11/2000   SpO2 99%   BMI 30.09 kg/m   General Appearance: Well nourished, in no apparent distress. Eyes: PERRLA, EOMs, conjunctiva no swelling or erythema Respiratory: Respiratory effort normal, BS equal bilaterally without rales, rhonchi, wheezing or stridor.  Cardio: RRR with no MRGs. Brisk peripheral pulses without edema.  Abdomen: Soft, + BS.  Non tender, no guarding, rebound, hernias, masses. Musculoskeletal: Full ROM, 5/5 strength, normal gait.  Skin: Warm, dry without rashes, lesions, ecchymosis.  Neuro: Cranial nerves intact. Normal muscle tone, no cerebellar symptoms. Sensation intact.  Psych: Awake and oriented X 3, anxious affect, Insight and Judgment appropriate.     Raynelle Dick, NP 11:11 AM [facility]

## 2022-12-13 NOTE — Progress Notes (Signed)
Pt's states no medical or surgical changes since previsit or office visit. 

## 2022-12-17 DIAGNOSIS — M1712 Unilateral primary osteoarthritis, left knee: Secondary | ICD-10-CM | POA: Diagnosis not present

## 2022-12-17 NOTE — Progress Notes (Signed)
Surgery orders requested via Epic inbox. °

## 2022-12-19 ENCOUNTER — Ambulatory Visit: Payer: Self-pay | Admitting: Emergency Medicine

## 2022-12-19 DIAGNOSIS — G8929 Other chronic pain: Secondary | ICD-10-CM

## 2022-12-19 NOTE — H&P (Signed)
TOTAL KNEE ADMISSION H&P  Patient is being admitted for left total knee arthroplasty.  Subjective:  Chief Complaint:left knee pain.  HPI: Kimberly Bowers, 72 y.o. female, has a history of pain and functional disability in the left knee due to arthritis and has failed non-surgical conservative treatments for greater than 12 weeks to includeNSAID's and/or analgesics, corticosteriod injections, viscosupplementation injections, supervised PT with diminished ADL's post treatment, use of assistive devices, and activity modification.  Onset of symptoms was gradual, starting >10 years ago with gradually worsening course since that time. The patient noted prior procedures on the knee to include  arthroscopy on the left knee(s).  Patient currently rates pain in the left knee(s) at 8 out of 10 with activity. Patient has night pain, worsening of pain with activity and weight bearing, pain that interferes with activities of daily living, and pain with passive range of motion.  Patient has evidence of periarticular osteophytes and joint space narrowing by imaging studies.  There is no active infection.  Patient Active Problem List   Diagnosis Date Noted   CKD (chronic kidney disease) stage 2, GFR 60-89 ml/min 07/12/2021   Labile hypertension 02/07/2021   Arthritis of left knee 07/12/2020   Gallstones 01/10/2020   Aortic dilatation (HCC) 10/13/2019   Morbid obesity (HCC) 03/17/2014   Vitamin D deficiency 03/17/2014   Medication management 03/17/2014   Mixed hyperlipidemia 03/25/2013   Anxiety    Hypothyroid    Allergy    Past Medical History:  Diagnosis Date   Allergy    Aortic aneurysm, thoracic (HCC)    Mild   Arthritis    Cataract    CKD (chronic kidney disease) stage 3, GFR 30-59 ml/min (HCC) 01/11/2020   Diverticulitis 2004   Heart murmur    Just fgor couple years per pt   HTN (hypertension) 12/13/2022   B/P severe,    B/,P 160s,  DB/P running >110   Hypothyroid    Labile hypertension  02/07/2021   Neutropenia (HCC)     Past Surgical History:  Procedure Laterality Date   BRACHIOPLASTY  03/2016   BREAST BIOPSY Left 01/2018   Negative benign   CATARACT EXTRACTION Bilateral 2019   FOOT SURGERY Right    benign cyst   KNEE ARTHROSCOPY Left    LAPAROSCOPIC GASTRIC BANDING  2004   LAPAROSCOPIC REPAIR AND REMOVAL OF GASTRIC BAND     8/14   TUBAL LIGATION     age 9   UTERINE FIBROID SURGERY  2001    Current Outpatient Medications  Medication Sig Dispense Refill Last Dose   B Complex Vitamins (B COMPLEX PO) Take 1 tablet by mouth daily.      Cholecalciferol (VITAMIN D) 2000 UNITS tablet Take 2,000 Units by mouth daily.      levothyroxine (SYNTHROID) 125 MCG tablet Take 1 tablet by mouth once a day on an empty stomach with only water for 30 minutes, no antacids, calcium, magnesium for 4 hours & avoid biotin 90 tablet 0    losartan (COZAAR) 25 MG tablet Take 1 tablet (25 mg total) by mouth daily. 30 tablet 11    MAGNESIUM PO Take 150 mg by mouth daily.      Omega-3 Fatty Acids (OMEGA 3 500 PO) Take 500 mg by mouth daily.      OVER THE COUNTER MEDICATION Take 1 tablet by mouth daily. Intestinal formula #1 supplement (Patient not taking: Reported on 12/13/2022)      rosuvastatin (CRESTOR) 5 MG tablet Take 1  tab daily to reduce risk of heart attack and stroke. 90 tablet 3    Semaglutide-Weight Management (WEGOVY) 2.4 MG/0.75ML SOAJ Inject 2.4 mg into the skin once a week. 3 mL 2    Wheat Dextrin (BENEFIBER) POWD Take 1 Dose by mouth daily. 1 dose = 1 tablespoon      Current Facility-Administered Medications  Medication Dose Route Frequency Provider Last Rate Last Admin   0.9 %  sodium chloride infusion  500 mL Intravenous Once Nandigam, Eleonore Chiquito, MD       Allergies  Allergen Reactions   Quinolones     Aortic aneurysm    Social History   Tobacco Use   Smoking status: Never   Smokeless tobacco: Never  Substance Use Topics   Alcohol use: Yes    Alcohol/week: 1.0 -  2.0 standard drink of alcohol    Types: 1 - 2 Glasses of wine per week    Family History  Problem Relation Age of Onset   Cancer Mother        lung   Diabetes Father    Emphysema Father    Melanoma Brother    Colon cancer Neg Hx    Colon polyps Neg Hx    Esophageal cancer Neg Hx    Stomach cancer Neg Hx    Rectal cancer Neg Hx      Review of Systems  Musculoskeletal:  Positive for arthralgias.  All other systems reviewed and are negative.   Objective:  Physical Exam Constitutional:      General: She is not in acute distress.    Appearance: Normal appearance. She is not ill-appearing.  HENT:     Head: Normocephalic and atraumatic.     Right Ear: External ear normal.     Left Ear: External ear normal.     Nose: Nose normal.     Mouth/Throat:     Mouth: Mucous membranes are moist.     Pharynx: Oropharynx is clear.  Eyes:     Extraocular Movements: Extraocular movements intact.     Conjunctiva/sclera: Conjunctivae normal.  Cardiovascular:     Rate and Rhythm: Normal rate and regular rhythm.     Pulses: Normal pulses.     Heart sounds: Normal heart sounds.  Pulmonary:     Effort: Pulmonary effort is normal.     Breath sounds: Normal breath sounds.  Abdominal:     General: Bowel sounds are normal.     Palpations: Abdomen is soft.     Tenderness: There is no abdominal tenderness.  Musculoskeletal:        General: Tenderness present.     Cervical back: Normal range of motion and neck supple.     Comments: TTP over medial and lateral joint line, medial worse than lateral.  No calf tenderness, swelling, or erythema.  No overlying lesions of area of chief complaint.  Decreased strength and ROM due to elicited pain.  Dorsiflexion and plantarflexion intact.  Stable to varus and valgus stress.  BLE appear grossly neurovascularly intact.  Gait mildly antalgic.   Skin:    General: Skin is warm and dry.  Neurological:     Mental Status: She is alert and oriented to person,  place, and time. Mental status is at baseline.  Psychiatric:        Mood and Affect: Mood normal.        Behavior: Behavior normal.     Vital signs in last 24 hours: @VSRANGES @  Labs:   Estimated body  mass index is 30.09 kg/m as calculated from the following:   Height as of 12/13/22: 5' 5.25" (1.657 m).   Weight as of 12/13/22: 82.6 kg.   Imaging Review Plain radiographs demonstrate severe degenerative joint disease of the left knee(s). The overall alignment is moderate valgus . The bone quality appears to be fair for age and reported activity level.      Assessment/Plan:  End stage arthritis, left knee   The patient history, physical examination, clinical judgment of the provider and imaging studies are consistent with end stage degenerative joint disease of the left knee(s) and total knee arthroplasty is deemed medically necessary. The treatment options including medical management, injection therapy arthroscopy and arthroplasty were discussed at length. The risks and benefits of total knee arthroplasty were presented and reviewed. The risks due to aseptic loosening, infection, stiffness, patella tracking problems, thromboembolic complications and other imponderables were discussed. The patient acknowledged the explanation, agreed to proceed with the plan and consent was signed. Patient is being admitted for inpatient treatment for surgery, pain control, PT, OT, prophylactic antibiotics, VTE prophylaxis, progressive ambulation and ADL's and discharge planning. The patient is planning to be discharged home with outpatient PT.    Anticipated LOS equal to or greater than 2 midnights due to - Age 9 and older with one or more of the following:  - Obesity  - Expected need for hospital services (PT, OT, Nursing) required for safe  discharge  - Anticipated need for postoperative skilled nursing care or inpatient rehab  - Active co-morbidities: HTN, HLD, CKD stage 2-3, ascending aortic  dilation, anxiety, hypothyroidism, diverticulosis, geriatric OR   - Unanticipated findings during/Post Surgery: None  - Patient is a high risk of re-admission due to: None

## 2022-12-19 NOTE — H&P (View-Only) (Signed)
TOTAL KNEE ADMISSION H&P  Patient is being admitted for left total knee arthroplasty.  Subjective:  Chief Complaint:left knee pain.  HPI: Kimberly Bowers, 72 y.o. female, has a history of pain and functional disability in the left knee due to arthritis and has failed non-surgical conservative treatments for greater than 12 weeks to includeNSAID's and/or analgesics, corticosteriod injections, viscosupplementation injections, supervised PT with diminished ADL's post treatment, use of assistive devices, and activity modification.  Onset of symptoms was gradual, starting >10 years ago with gradually worsening course since that time. The patient noted prior procedures on the knee to include  arthroscopy on the left knee(s).  Patient currently rates pain in the left knee(s) at 8 out of 10 with activity. Patient has night pain, worsening of pain with activity and weight bearing, pain that interferes with activities of daily living, and pain with passive range of motion.  Patient has evidence of periarticular osteophytes and joint space narrowing by imaging studies.  There is no active infection.  Patient Active Problem List   Diagnosis Date Noted   CKD (chronic kidney disease) stage 2, GFR 60-89 ml/min 07/12/2021   Labile hypertension 02/07/2021   Arthritis of left knee 07/12/2020   Gallstones 01/10/2020   Aortic dilatation (HCC) 10/13/2019   Morbid obesity (HCC) 03/17/2014   Vitamin D deficiency 03/17/2014   Medication management 03/17/2014   Mixed hyperlipidemia 03/25/2013   Anxiety    Hypothyroid    Allergy    Past Medical History:  Diagnosis Date   Allergy    Aortic aneurysm, thoracic (HCC)    Mild   Arthritis    Cataract    CKD (chronic kidney disease) stage 3, GFR 30-59 ml/min (HCC) 01/11/2020   Diverticulitis 2004   Heart murmur    Just fgor couple years per pt   HTN (hypertension) 12/13/2022   B/P severe,    B/,P 160s,  DB/P running >110   Hypothyroid    Labile hypertension  02/07/2021   Neutropenia (HCC)     Past Surgical History:  Procedure Laterality Date   BRACHIOPLASTY  03/2016   BREAST BIOPSY Left 01/2018   Negative benign   CATARACT EXTRACTION Bilateral 2019   FOOT SURGERY Right    benign cyst   KNEE ARTHROSCOPY Left    LAPAROSCOPIC GASTRIC BANDING  2004   LAPAROSCOPIC REPAIR AND REMOVAL OF GASTRIC BAND     8/14   TUBAL LIGATION     age 9   UTERINE FIBROID SURGERY  2001    Current Outpatient Medications  Medication Sig Dispense Refill Last Dose   B Complex Vitamins (B COMPLEX PO) Take 1 tablet by mouth daily.      Cholecalciferol (VITAMIN D) 2000 UNITS tablet Take 2,000 Units by mouth daily.      levothyroxine (SYNTHROID) 125 MCG tablet Take 1 tablet by mouth once a day on an empty stomach with only water for 30 minutes, no antacids, calcium, magnesium for 4 hours & avoid biotin 90 tablet 0    losartan (COZAAR) 25 MG tablet Take 1 tablet (25 mg total) by mouth daily. 30 tablet 11    MAGNESIUM PO Take 150 mg by mouth daily.      Omega-3 Fatty Acids (OMEGA 3 500 PO) Take 500 mg by mouth daily.      OVER THE COUNTER MEDICATION Take 1 tablet by mouth daily. Intestinal formula #1 supplement (Patient not taking: Reported on 12/13/2022)      rosuvastatin (CRESTOR) 5 MG tablet Take 1  tab daily to reduce risk of heart attack and stroke. 90 tablet 3    Semaglutide-Weight Management (WEGOVY) 2.4 MG/0.75ML SOAJ Inject 2.4 mg into the skin once a week. 3 mL 2    Wheat Dextrin (BENEFIBER) POWD Take 1 Dose by mouth daily. 1 dose = 1 tablespoon      Current Facility-Administered Medications  Medication Dose Route Frequency Provider Last Rate Last Admin   0.9 %  sodium chloride infusion  500 mL Intravenous Once Nandigam, Eleonore Chiquito, MD       Allergies  Allergen Reactions   Quinolones     Aortic aneurysm    Social History   Tobacco Use   Smoking status: Never   Smokeless tobacco: Never  Substance Use Topics   Alcohol use: Yes    Alcohol/week: 1.0 -  2.0 standard drink of alcohol    Types: 1 - 2 Glasses of wine per week    Family History  Problem Relation Age of Onset   Cancer Mother        lung   Diabetes Father    Emphysema Father    Melanoma Brother    Colon cancer Neg Hx    Colon polyps Neg Hx    Esophageal cancer Neg Hx    Stomach cancer Neg Hx    Rectal cancer Neg Hx      Review of Systems  Musculoskeletal:  Positive for arthralgias.  All other systems reviewed and are negative.   Objective:  Physical Exam Constitutional:      General: She is not in acute distress.    Appearance: Normal appearance. She is not ill-appearing.  HENT:     Head: Normocephalic and atraumatic.     Right Ear: External ear normal.     Left Ear: External ear normal.     Nose: Nose normal.     Mouth/Throat:     Mouth: Mucous membranes are moist.     Pharynx: Oropharynx is clear.  Eyes:     Extraocular Movements: Extraocular movements intact.     Conjunctiva/sclera: Conjunctivae normal.  Cardiovascular:     Rate and Rhythm: Normal rate and regular rhythm.     Pulses: Normal pulses.     Heart sounds: Normal heart sounds.  Pulmonary:     Effort: Pulmonary effort is normal.     Breath sounds: Normal breath sounds.  Abdominal:     General: Bowel sounds are normal.     Palpations: Abdomen is soft.     Tenderness: There is no abdominal tenderness.  Musculoskeletal:        General: Tenderness present.     Cervical back: Normal range of motion and neck supple.     Comments: TTP over medial and lateral joint line, medial worse than lateral.  No calf tenderness, swelling, or erythema.  No overlying lesions of area of chief complaint.  Decreased strength and ROM due to elicited pain.  Dorsiflexion and plantarflexion intact.  Stable to varus and valgus stress.  BLE appear grossly neurovascularly intact.  Gait mildly antalgic.   Skin:    General: Skin is warm and dry.  Neurological:     Mental Status: She is alert and oriented to person,  place, and time. Mental status is at baseline.  Psychiatric:        Mood and Affect: Mood normal.        Behavior: Behavior normal.     Vital signs in last 24 hours: @VSRANGES @  Labs:   Estimated body  mass index is 30.09 kg/m as calculated from the following:   Height as of 12/13/22: 5' 5.25" (1.657 m).   Weight as of 12/13/22: 82.6 kg.   Imaging Review Plain radiographs demonstrate severe degenerative joint disease of the left knee(s). The overall alignment is moderate valgus . The bone quality appears to be fair for age and reported activity level.      Assessment/Plan:  End stage arthritis, left knee   The patient history, physical examination, clinical judgment of the provider and imaging studies are consistent with end stage degenerative joint disease of the left knee(s) and total knee arthroplasty is deemed medically necessary. The treatment options including medical management, injection therapy arthroscopy and arthroplasty were discussed at length. The risks and benefits of total knee arthroplasty were presented and reviewed. The risks due to aseptic loosening, infection, stiffness, patella tracking problems, thromboembolic complications and other imponderables were discussed. The patient acknowledged the explanation, agreed to proceed with the plan and consent was signed. Patient is being admitted for inpatient treatment for surgery, pain control, PT, OT, prophylactic antibiotics, VTE prophylaxis, progressive ambulation and ADL's and discharge planning. The patient is planning to be discharged home with outpatient PT.    Anticipated LOS equal to or greater than 2 midnights due to - Age 9 and older with one or more of the following:  - Obesity  - Expected need for hospital services (PT, OT, Nursing) required for safe  discharge  - Anticipated need for postoperative skilled nursing care or inpatient rehab  - Active co-morbidities: HTN, HLD, CKD stage 2-3, ascending aortic  dilation, anxiety, hypothyroidism, diverticulosis, geriatric OR   - Unanticipated findings during/Post Surgery: None  - Patient is a high risk of re-admission due to: None

## 2022-12-23 NOTE — Patient Instructions (Addendum)
DUE TO COVID-19 ONLY TWO VISITORS  (aged 74 and older)  ARE ALLOWED TO COME WITH YOU AND STAY IN THE WAITING ROOM ONLY DURING PRE OP AND PROCEDURE.   **NO VISITORS ARE ALLOWED IN THE SHORT STAY AREA OR RECOVERY ROOM!!**  IF YOU WILL BE ADMITTED INTO THE HOSPITAL YOU ARE ALLOWED ONLY FOUR SUPPORT PEOPLE DURING VISITATION HOURS ONLY (7 AM -8PM)   The support person(s) must pass our screening, gel in and out, and wear a mask at all times, including in the patient's room. Patients must also wear a mask when staff or their support person are in the room. Visitors GUEST BADGE MUST BE WORN VISIBLY  One adult visitor may remain with you overnight and MUST be in the room by 8 P.M.     Your procedure is scheduled on: 01/06/23   Report to [facility] Main Entrance    Report to admitting at : 10:30 AM   Call this number if you have problems the morning of surgery [phone]   Do not eat food :After Midnight.   After Midnight you may have the following liquids until : 10:00 AM DAY OF SURGERY  Water Black Coffee (sugar ok, NO MILK/CREAM OR CREAMERS)  Tea (sugar ok, NO MILK/CREAM OR CREAMERS) regular and decaf                             Plain Jell-O (NO RED)                                           Fruit ices (not with fruit pulp, NO RED)                                     Popsicles (NO RED)                                                                  Juice: apple, WHITE grape, WHITE cranberry Sports drinks like Gatorade (NO RED)   The day of surgery:  Drink ONE (1) Pre-Surgery Clear Ensure at : 10:00 AM the morning of surgery. Drink in one sitting. Do not sip.  This drink was given to you during your hospital  pre-op appointment visit. Nothing else to drink after completing the  Pre-Surgery Clear Ensure or G2.          If you have questions, please contact your surgeon's office.  FOLLOW ANY ADDITIONAL PRE OP INSTRUCTIONS YOU RECEIVED FROM YOUR SURGEON'S OFFICE!!!   Oral  Hygiene is also important to reduce your risk of infection.                                    Remember - BRUSH YOUR TEETH THE MORNING OF SURGERY WITH YOUR REGULAR TOOTHPASTE  DENTURES WILL BE REMOVED PRIOR TO SURGERY PLEASE DO NOT APPLY "Poly grip" OR ADHESIVES!!!   Do NOT smoke after Midnight   Take these medicines the morning of surgery  with A SIP OF WATER: levothyroxine.  HOLD semaglutide after: 12/29/22                              You may not have any metal on your body including hair pins, jewelry, and body piercing             Do not wear make-up, lotions, powders, perfumes/cologne, or deodorant  Do not wear nail polish including gel and S&S, artificial/acrylic nails, or any other type of covering on natural nails including finger and toenails. If you have artificial nails, gel coating, etc. that needs to be removed by a nail salon please have this removed prior to surgery or surgery may need to be canceled/ delayed if the surgeon/ anesthesia feels like they are unable to be safely monitored.   Do not shave  48 hours prior to surgery.    Do not bring valuables to the hospital. [facility] IS NOT             RESPONSIBLE   FOR VALUABLES.   Contacts, glasses, or bridgework may not be worn into surgery.   Bring small overnight bag day of surgery.   DO NOT BRING YOUR HOME MEDICATIONS TO THE HOSPITAL. PHARMACY WILL DISPENSE MEDICATIONS LISTED ON YOUR MEDICATION LIST TO YOU DURING YOUR ADMISSION IN THE HOSPITAL!    Patients discharged on the day of surgery will not be allowed to drive home.  Someone NEEDS to stay with you for the first 24 hours after anesthesia.   Special Instructions: Bring a copy of your healthcare power of attorney and living will documents         the day of surgery if you haven't scanned them before.              Please read over the following fact sheets you were given: IF YOU HAVE QUESTIONS ABOUT YOUR PRE-OP INSTRUCTIONS PLEASE CALL [phone]       Pre-operative 5 CHG Bath Instructions   You can play a key role in reducing the risk of infection after surgery. Your skin needs to be as free of germs as possible. You can reduce the number of germs on your skin by washing with CHG (chlorhexidine gluconate) soap before surgery. CHG is an antiseptic soap that kills germs and continues to kill germs even after washing.   DO NOT use if you have an allergy to chlorhexidine/CHG or antibacterial soaps. If your skin becomes reddened or irritated, stop using the CHG and notify one of our RNs at : [phone].   Please shower with the CHG soap starting 4 days before surgery using the following schedule:     Please keep in mind the following:  DO NOT shave, including legs and underarms, starting the day of your first shower.   You may shave your face at any point before/day of surgery.  Place clean sheets on your bed the day you start using CHG soap. Use a clean washcloth (not used since being washed) for each shower. DO NOT sleep with pets once you start using the CHG.   CHG Shower Instructions:  If you choose to wash your hair and private area, wash first with your normal shampoo/soap.  After you use shampoo/soap, rinse your hair and body thoroughly to remove shampoo/soap residue.  Turn the water OFF and apply about 3 tablespoons (45 ml) of CHG soap to a CLEAN washcloth.  Apply CHG soap  ONLY FROM YOUR NECK DOWN TO YOUR TOES (washing for 3-5 minutes)  DO NOT use CHG soap on face, private areas, open wounds, or sores.  Pay special attention to the area where your surgery is being performed.  If you are having back surgery, having someone wash your back for you may be helpful. Wait 2 minutes after CHG soap is applied, then you may rinse off the CHG soap.  Pat dry with a clean towel  Put on clean clothes/pajamas   If you choose to wear lotion, please use ONLY the CHG-compatible lotions on the back of this paper.     Additional instructions  for the day of surgery: DO NOT APPLY any lotions, deodorants, cologne, or perfumes.   Put on clean/comfortable clothes.  Brush your teeth.  Ask your nurse before applying any prescription medications to the skin.    CHG Compatible Lotions   Aveeno Moisturizing lotion  Cetaphil Moisturizing Cream  Cetaphil Moisturizing Lotion  Clairol Herbal Essence Moisturizing Lotion, Dry Skin  Clairol Herbal Essence Moisturizing Lotion, Extra Dry Skin  Clairol Herbal Essence Moisturizing Lotion, Normal Skin  Curel Age Defying Therapeutic Moisturizing Lotion with Alpha Hydroxy  Curel Extreme Care Body Lotion  Curel Soothing Hands Moisturizing Hand Lotion  Curel Therapeutic Moisturizing Cream, Fragrance-Free  Curel Therapeutic Moisturizing Lotion, Fragrance-Free  Curel Therapeutic Moisturizing Lotion, Original Formula  Eucerin Daily Replenishing Lotion  Eucerin Dry Skin Therapy Plus Alpha Hydroxy Crme  Eucerin Dry Skin Therapy Plus Alpha Hydroxy Lotion  Eucerin Original Crme  Eucerin Original Lotion  Eucerin Plus Crme Eucerin Plus Lotion  Eucerin TriLipid Replenishing Lotion  Keri Anti-Bacterial Hand Lotion  Keri Deep Conditioning Original Lotion Dry Skin Formula Softly Scented  Keri Deep Conditioning Original Lotion, Fragrance Free Sensitive Skin Formula  Keri Lotion Fast Absorbing Fragrance Free Sensitive Skin Formula  Keri Lotion Fast Absorbing Softly Scented Dry Skin Formula  Keri Original Lotion  Keri Skin Renewal Lotion Keri Silky Smooth Lotion  Keri Silky Smooth Sensitive Skin Lotion  Nivea Body Creamy Conditioning Oil  Nivea Body Extra Enriched Lotion  Nivea Body Original Lotion  Nivea Body Sheer Moisturizing Lotion Nivea Crme  Nivea Skin Firming Lotion  NutraDerm 30 Skin Lotion  NutraDerm Skin Lotion  NutraDerm Therapeutic Skin Cream  NutraDerm Therapeutic Skin Lotion  ProShield Protective Hand Cream  Provon moisturizing lotion   Incentive Spirometer  An incentive  spirometer is a tool that can help keep your lungs clear and active. This tool measures how well you are filling your lungs with each breath. Taking long deep breaths may help reverse or decrease the chance of developing breathing (pulmonary) problems (especially infection) following: A long period of time when you are unable to move or be active. BEFORE THE PROCEDURE  If the spirometer includes an indicator to show your best effort, your nurse or respiratory therapist will set it to a desired goal. If possible, sit up straight or lean slightly forward. Try not to slouch. Hold the incentive spirometer in an upright position. INSTRUCTIONS FOR USE  Sit on the edge of your bed if possible, or sit up as far as you can in bed or on a chair. Hold the incentive spirometer in an upright position. Breathe out normally. Place the mouthpiece in your mouth and seal your lips tightly around it. Breathe in slowly and as deeply as possible, raising the piston or the ball toward the top of the column. Hold your breath for 3-5 seconds or for as long as possible. Allow  the piston or ball to fall to the bottom of the column. Remove the mouthpiece from your mouth and breathe out normally. Rest for a few seconds and repeat Steps 1 through 7 at least 10 times every 1-2 hours when you are awake. Take your time and take a few normal breaths between deep breaths. The spirometer may include an indicator to show your best effort. Use the indicator as a goal to work toward during each repetition. After each set of 10 deep breaths, practice coughing to be sure your lungs are clear. If you have an incision (the cut made at the time of surgery), support your incision when coughing by placing a pillow or rolled up towels firmly against it. Once you are able to get out of bed, walk around indoors and cough well. You may stop using the incentive spirometer when instructed by your caregiver.  RISKS AND COMPLICATIONS Take your time  so you do not get dizzy or light-headed. If you are in pain, you may need to take or ask for pain medication before doing incentive spirometry. It is harder to take a deep breath if you are having pain. AFTER USE Rest and breathe slowly and easily. It can be helpful to keep track of a log of your progress. Your caregiver can provide you with a simple table to help with this. If you are using the spirometer at home, follow these instructions: SEEK MEDICAL CARE IF:  You are having difficultly using the spirometer. You have trouble using the spirometer as often as instructed. Your pain medication is not giving enough relief while using the spirometer. You develop fever of 100.5 F (38.1 C) or higher. SEEK IMMEDIATE MEDICAL CARE IF:  You cough up bloody sputum that had not been present before. You develop fever of 102 F (38.9 C) or greater. You develop worsening pain at or near the incision site. MAKE SURE YOU:  Understand these instructions. Will watch your condition. Will get help right away if you are not doing well or get worse. Document Released: 07/08/2006 Document Revised: 05/20/2011 Document Reviewed: 09/08/2006 [facility] Patient Information 2014 [place], [place].   ________________________________________________________________________

## 2022-12-24 ENCOUNTER — Other Ambulatory Visit: Payer: Self-pay

## 2022-12-24 ENCOUNTER — Encounter (HOSPITAL_COMMUNITY): Payer: Self-pay

## 2022-12-24 ENCOUNTER — Encounter (HOSPITAL_COMMUNITY)
Admission: RE | Admit: 2022-12-24 | Discharge: 2022-12-24 | Disposition: A | Discharge status: Home / Self Care | Payer: Medicare PPO | Source: Ambulatory Visit | Attending: Orthopedic Surgery | Admitting: Orthopedic Surgery

## 2022-12-24 VITALS — BP 141/110 | HR 89 | Temp 97.7°F | Ht 65.25 in | Wt 178.0 lb

## 2022-12-24 DIAGNOSIS — Z01812 Encounter for preprocedural laboratory examination: Secondary | ICD-10-CM | POA: Diagnosis not present

## 2022-12-24 DIAGNOSIS — G8929 Other chronic pain: Secondary | ICD-10-CM | POA: Insufficient documentation

## 2022-12-24 DIAGNOSIS — Z01818 Encounter for other preprocedural examination: Secondary | ICD-10-CM | POA: Diagnosis present

## 2022-12-24 DIAGNOSIS — M25562 Pain in left knee: Secondary | ICD-10-CM | POA: Insufficient documentation

## 2022-12-24 HISTORY — DX: Other specified postprocedural states: Z98.890

## 2022-12-24 HISTORY — DX: Malignant (primary) neoplasm, unspecified: C80.1

## 2022-12-24 LAB — CBC WITH DIFFERENTIAL/PLATELET
Abs Immature Granulocytes: 0.01 10*3/uL (ref 0.00–0.07)
Basophils Absolute: 0 10*3/uL (ref 0.0–0.1)
Basophils Relative: 1 %
Eosinophils Absolute: 0.1 10*3/uL (ref 0.0–0.5)
Eosinophils Relative: 1 %
HCT: 43.1 % (ref 36.0–46.0)
Hemoglobin: 13.9 g/dL (ref 12.0–15.0)
Immature Granulocytes: 0 %
Lymphocytes Relative: 21 %
Lymphs Abs: 1.1 10*3/uL (ref 0.7–4.0)
MCH: 33.4 pg (ref 26.0–34.0)
MCHC: 32.3 g/dL (ref 30.0–36.0)
MCV: 103.6 fL — ABNORMAL HIGH (ref 80.0–100.0)
Monocytes Absolute: 0.3 10*3/uL (ref 0.1–1.0)
Monocytes Relative: 7 %
Neutro Abs: 3.4 10*3/uL (ref 1.7–7.7)
Neutrophils Relative %: 70 %
Platelets: 264 10*3/uL (ref 150–400)
RBC: 4.16 MIL/uL (ref 3.87–5.11)
RDW: 12.4 % (ref 11.5–15.5)
WBC: 4.9 10*3/uL (ref 4.0–10.5)
nRBC: 0 % (ref 0.0–0.2)

## 2022-12-24 LAB — TYPE AND SCREEN
ABO/RH(D): A POS
Antibody Screen: NEGATIVE

## 2022-12-24 LAB — SURGICAL PCR SCREEN
MRSA, PCR: NEGATIVE
Staphylococcus aureus: NEGATIVE

## 2022-12-24 LAB — COMPREHENSIVE METABOLIC PANEL
ALT: 19 U/L (ref 0–44)
AST: 25 U/L (ref 15–41)
Albumin: 4.5 g/dL (ref 3.5–5.0)
Alkaline Phosphatase: 51 U/L (ref 38–126)
Anion gap: 9 (ref 5–15)
BUN: 20 mg/dL (ref 8–23)
CO2: 26 mmol/L (ref 22–32)
Calcium: 9.4 mg/dL (ref 8.9–10.3)
Chloride: 100 mmol/L (ref 98–111)
Creatinine, Ser: 0.9 mg/dL (ref 0.44–1.00)
GFR, Estimated: >60 mL/min (ref >60)
Glucose, Bld: 96 mg/dL (ref 70–99)
Potassium: 3.7 mmol/L (ref 3.5–5.1)
Sodium: 135 mmol/L (ref 135–145)
Total Bilirubin: 1 mg/dL (ref 0.3–1.2)
Total Protein: 7.3 g/dL (ref 6.5–8.1)

## 2022-12-24 NOTE — Progress Notes (Signed)
For Short Stay: COVID SWAB appointment date:  Bowel Prep reminder:   For Anesthesia: PCP - Raynelle Dick, NP  Cardiologist - Loreli Slot, MD Physician Cardiothoracic Surgery CT angio chest: 07/29/22 Chest x-ray -  EKG - 10/22/22 Stress Test -  ECHO - 12/26/20 Cardiac Cath -  Pacemaker/ICD device last checked: Pacemaker orders received: Device Rep notified:  Spinal Cord Stimulator: N/A  Sleep Study - N/A CPAP -   Fasting Blood Sugar -  Checks Blood Sugar _____ times a day Date and result of last Hgb A1c-  Last dose of GLP1 agonist- semaglutide: Will be hold after: 12/29/22 GLP1 instructions:   Last dose of SGLT-2 inhibitors- N/A SGLT-2 instructions:   Blood Thinner Instructions: N/A Aspirin Instructions: Last Dose:  Activity level: Can go up a flight of stairs and activities of daily living without stopping and without chest pain and/or shortness of breath   Able to exercise without chest pain and/or shortness of breath    Anesthesia review: Hx: HTN,CKD II,Heart murmur,palpitations. BP during PST: 140/111; 141/110. Pt. Colonoscopy was cancelled on 12/13/22 because high BP. She was started on losartan 25 mg after that. She has an appointment with PCP on 12/27/22.  Patient denies shortness of breath, fever, cough and chest pain at PAT appointment   Patient verbalized understanding of instructions that were given to them at the PAT appointment. Patient was also instructed that they will need to review over the PAT instructions again at home before surgery.

## 2022-12-26 NOTE — Progress Notes (Signed)
Assessment and Plan:  Nicolena was seen today for medication management.  Diagnoses and all orders for this visit:  Obesity (BMI 30.0-34.9) Continue diet and exercise Continue Wegovy  Essential hypertension Continue Losartan 25 mg every day  - continue  DASH diet, exercise and monitor at home. Call if greater than 130/80.              Further disposition pending results of labs. Discussed med's effects and SE's.   Over 30 minutes of exam, counseling, chart review, and critical decision making was performed.   Future Appointments  Date Time Provider Department Center  05/29/2023  9:00 AM Raynelle Dick, NP [facility] None  12/02/2023  9:00 AM Raynelle Dick, NP [facility] None    ------------------------------------------------------------------------------------------------------------------   HPI BP 124/82   Pulse 80   Temp 97.6 F (36.4 C)   Ht 5' 5.25" (1.657 m)   Wt 180 lb (81.6 kg)   LMP 03/11/2000   SpO2 99%   BMI 29.72 kg/m   72 y.o.female presents for reevaluation of  blood pressure and weight BP was not controlled today when she went to have colonoscopy. BP was systolic 60-190 range/ diastolic consistently greater than 110. She was started on Losartan 25 mg every day on 12/24/22. Home BP 100-120/70-80's. When she went for preop at [facility]  on 12/17/22 it was 123/88. BP Readings from Last 3 Encounters:  12/27/22 124/82  12/24/22 (!) 141/110  12/13/22 (!) 142/100  Denies headaches, chest pain, shortness of breath and dizziness   Has been fluctuating between stage 2 and stage 3 CKD.  Not currently on an ACE/ARB Lab Results  Component Value Date   EGFR 60 10/21/2022    BMI is Body mass index is 29.72 kg/m., she has been working on diet and exercise. On Wegovy 2.4 mg SQ QW Wt Readings from Last 3 Encounters:  12/27/22 180 lb (81.6 kg)  12/24/22 178 lb (80.7 kg)  12/13/22 182 lb 3.2 oz (82.6 kg)    Past Medical History:  Diagnosis Date    Allergy    Aortic aneurysm, thoracic (HCC)    Mild   Arthritis    Cancer (HCC)    Skin cancer: leg   Cataract    CKD (chronic kidney disease) stage 3, GFR 30-59 ml/min (HCC) 01/11/2020   stage 2: 12/24/22   Diverticulitis 2004   Heart murmur    Just fgor couple years per pt   HTN (hypertension) 12/13/2022   B/P severe,    B/,P 160s,  DB/P running >110   Hypothyroid    Labile hypertension 02/07/2021   Neutropenia (HCC)    PONV (postoperative nausea and vomiting)      Allergies  Allergen Reactions   Quinolones     Aortic aneurysm    Current Outpatient Medications on File Prior to Visit  Medication Sig   B Complex Vitamins (B COMPLEX PO) Take 1 tablet by mouth daily.   Cholecalciferol (VITAMIN D) 2000 UNITS tablet Take 2,000 Units by mouth daily.   levothyroxine (SYNTHROID) 125 MCG tablet Take 1 tablet by mouth once a day on an empty stomach with only water for 30 minutes, no antacids, calcium, magnesium for 4 hours & avoid biotin   losartan (COZAAR) 25 MG tablet Take 1 tablet (25 mg total) by mouth daily.   MAGNESIUM PO Take 150 mg by mouth daily.   Omega-3 Fatty Acids (OMEGA 3 500 PO) Take 500 mg by mouth daily.   rosuvastatin (CRESTOR) 5 MG  tablet Take 1 tab daily to reduce risk of heart attack and stroke.   Semaglutide-Weight Management (WEGOVY) 2.4 MG/0.75ML SOAJ Inject 2.4 mg into the skin once a week.   Wheat Dextrin (BENEFIBER) POWD Take 1 Dose by mouth daily. 1 dose = 1 tablespoon   OVER THE COUNTER MEDICATION Take 1 tablet by mouth daily. Intestinal formula #1 supplement (Patient not taking: Reported on 12/13/2022)   Current Facility-Administered Medications on File Prior to Visit  Medication   0.9 %  sodium chloride infusion    ROS: all negative except above.   Physical Exam:  BP 124/82   Pulse 80   Temp 97.6 F (36.4 C)   Ht 5' 5.25" (1.657 m)   Wt 180 lb (81.6 kg)   LMP 03/11/2000   SpO2 99%   BMI 29.72 kg/m   General Appearance: Well nourished, in  no apparent distress. Eyes: PERRLA, EOMs, conjunctiva no swelling or erythema Respiratory: Respiratory effort normal, BS equal bilaterally without rales, rhonchi, wheezing or stridor.  Cardio: RRR with no MRGs. Brisk peripheral pulses without edema.  Abdomen: Soft, + BS.  Non tender, no guarding, rebound, hernias, masses. Musculoskeletal: Full ROM, 5/5 strength, normal gait.  Skin: Warm, dry without rashes, lesions, ecchymosis.  Neuro: Cranial nerves intact. Normal muscle tone, no cerebellar symptoms. Sensation intact.  Psych: Awake and oriented X 3, anxious affect, Insight and Judgment appropriate.     Raynelle Dick, NP 10:52 AM [facility]

## 2022-12-27 ENCOUNTER — Encounter: Payer: Self-pay | Admitting: Nurse Practitioner

## 2022-12-27 ENCOUNTER — Ambulatory Visit (INDEPENDENT_AMBULATORY_CARE_PROVIDER_SITE_OTHER): Payer: Medicare PPO | Admitting: Nurse Practitioner

## 2022-12-27 VITALS — BP 124/82 | HR 80 | Temp 97.6°F | Ht 65.25 in | Wt 180.0 lb

## 2022-12-27 DIAGNOSIS — E66811 Obesity, class 1: Secondary | ICD-10-CM | POA: Diagnosis not present

## 2022-12-27 DIAGNOSIS — I10 Essential (primary) hypertension: Secondary | ICD-10-CM | POA: Diagnosis not present

## 2022-12-27 NOTE — Patient Instructions (Signed)
BP appears well controlled on Losartan 25 mg every day - continue medication and monitor BP  Hypertension, Adult High blood pressure (hypertension) is when the force of blood pumping through the arteries is too strong. The arteries are the blood vessels that carry blood from the heart throughout the body. Hypertension forces the heart to work harder to pump blood and may cause arteries to become narrow or stiff. Untreated or uncontrolled hypertension can lead to a heart attack, heart failure, a stroke, kidney disease, and other problems. A blood pressure reading consists of a higher number over a lower number. Ideally, your blood pressure should be below 120/80. The first ("top") number is called the systolic pressure. It is a measure of the pressure in your arteries as your heart beats. The second ("bottom") number is called the diastolic pressure. It is a measure of the pressure in your arteries as the heart relaxes. What are the causes? The exact cause of this condition is not known. There are some conditions that result in high blood pressure. What increases the risk? Certain factors may make you more likely to develop high blood pressure. Some of these risk factors are under your control, including: Smoking. Not getting enough exercise or physical activity. Being overweight. Having too much fat, sugar, calories, or salt (sodium) in your diet. Drinking too much alcohol. Other risk factors include: Having a personal history of heart disease, diabetes, high cholesterol, or kidney disease. Stress. Having a family history of high blood pressure and high cholesterol. Having obstructive sleep apnea. Age. The risk increases with age. What are the signs or symptoms? High blood pressure may not cause symptoms. Very high blood pressure (hypertensive crisis) may cause: Headache. Fast or irregular heartbeats (palpitations). Shortness of breath. Nosebleed. Nausea and vomiting. Vision  changes. Severe chest pain, dizziness, and seizures. How is this diagnosed? This condition is diagnosed by measuring your blood pressure while you are seated, with your arm resting on a flat surface, your legs uncrossed, and your feet flat on the floor. The cuff of the blood pressure monitor will be placed directly against the skin of your upper arm at the level of your heart. Blood pressure should be measured at least twice using the same arm. Certain conditions can cause a difference in blood pressure between your right and left arms. If you have a high blood pressure reading during one visit or you have normal blood pressure with other risk factors, you may be asked to: Return on a different day to have your blood pressure checked again. Monitor your blood pressure at home for 1 week or longer. If you are diagnosed with hypertension, you may have other blood or imaging tests to help your health care provider understand your overall risk for other conditions. How is this treated? This condition is treated by making healthy lifestyle changes, such as eating healthy foods, exercising more, and reducing your alcohol intake. You may be referred for counseling on a healthy diet and physical activity. Your health care provider may prescribe medicine if lifestyle changes are not enough to get your blood pressure under control and if: Your systolic blood pressure is above 130. Your diastolic blood pressure is above 80. Your personal target blood pressure may vary depending on your medical conditions, your age, and other factors. Follow these instructions at home: Eating and drinking  Eat a diet that is high in fiber and potassium, and low in sodium, added sugar, and fat. An example of this eating plan is  called the DASH diet. DASH stands for Dietary Approaches to Stop Hypertension. To eat this way: Eat plenty of fresh fruits and vegetables. Try to fill one half of your plate at each meal with fruits and  vegetables. Eat whole grains, such as whole-wheat pasta, brown rice, or whole-grain bread. Fill about one fourth of your plate with whole grains. Eat or drink low-fat dairy products, such as skim milk or low-fat yogurt. Avoid fatty cuts of meat, processed or cured meats, and poultry with skin. Fill about one fourth of your plate with lean proteins, such as fish, chicken without skin, beans, eggs, or tofu. Avoid pre-made and processed foods. These tend to be higher in sodium, added sugar, and fat. Reduce your daily sodium intake. Many people with hypertension should eat less than 1,500 mg of sodium a day. Do not drink alcohol if: Your health care provider tells you not to drink. You are pregnant, may be pregnant, or are planning to become pregnant. If you drink alcohol: Limit how much you have to: 0-1 drink a day for women. 0-2 drinks a day for men. Know how much alcohol is in your drink. In the [place] one drink equals one 12 oz bottle of beer (355 mL), one 5 oz glass of wine (148 mL), or one 1 oz glass of hard liquor (44 mL). Lifestyle  Work with your health care provider to maintain a healthy body weight or to lose weight. Ask what an ideal weight is for you. Get at least 30 minutes of exercise that causes your heart to beat faster (aerobic exercise) most days of the week. Activities may include walking, swimming, or biking. Include exercise to strengthen your muscles (resistance exercise), such as Pilates or lifting weights, as part of your weekly exercise routine. Try to do these types of exercises for 30 minutes at least 3 days a week. Do not use any products that contain nicotine or tobacco. These products include cigarettes, chewing tobacco, and vaping devices, such as e-cigarettes. If you need help quitting, ask your health care provider. Monitor your blood pressure at home as told by your health care provider. Keep all follow-up visits. This is important. Medicines Take  over-the-counter and prescription medicines only as told by your health care provider. Follow directions carefully. Blood pressure medicines must be taken as prescribed. Do not skip doses of blood pressure medicine. Doing this puts you at risk for problems and can make the medicine less effective. Ask your health care provider about side effects or reactions to medicines that you should watch for. Contact a health care provider if you: Think you are having a reaction to a medicine you are taking. Have headaches that keep coming back (recurring). Feel dizzy. Have swelling in your ankles. Have trouble with your vision. Get help right away if you: Develop a severe headache or confusion. Have unusual weakness or numbness. Feel faint. Have severe pain in your chest or abdomen. Vomit repeatedly. Have trouble breathing. These symptoms may be an emergency. Get help right away. Call 911. Do not wait to see if the symptoms will go away. Do not drive yourself to the hospital. Summary Hypertension is when the force of blood pumping through your arteries is too strong. If this condition is not controlled, it may put you at risk for serious complications. Your personal target blood pressure may vary depending on your medical conditions, your age, and other factors. For most people, a normal blood pressure is less than 120/80. Hypertension is treated  with lifestyle changes, medicines, or a combination of both. Lifestyle changes include losing weight, eating a healthy, low-sodium diet, exercising more, and limiting alcohol. This information is not intended to replace advice given to you by your health care provider. Make sure you discuss any questions you have with your health care provider. Document Revised: 01/02/2021 Document Reviewed: 01/02/2021 Elsevier Patient Education  2024 [employer].

## 2022-12-27 NOTE — Care Plan (Signed)
Ortho Bundle Case Management Note  Patient Details  Name: Kimberly Bowers MRN: [id number] Date of Birth: 17-Oct-1950  met with patient in the office for H&P. will discharge to home with family to assist. rolling walker ordered for home use. HHPT referral to [facility], OPPT set up with [facility]. discharge instructions discussed and questions answered. Patient and MD in agreement with plan. Choice offered.                    DME Arranged:  Dan Humphreys rolling DME Agency:  [employer]  HH Arranged:  PT HH Agency:  [facility]  Additional Comments: Please contact me with any questions of if this plan should need to change.  Shauna Hugh,  RN,BSN,MHA,CCM  [facility]  [phone] 12/27/2022, 3:30 PM

## 2023-01-06 ENCOUNTER — Observation Stay (HOSPITAL_COMMUNITY)
Admission: RE | Admit: 2023-01-06 | Discharge: 2023-01-07 | Disposition: A | Discharge status: Home / Self Care | Payer: Medicare PPO | Source: Ambulatory Visit | Attending: Orthopedic Surgery | Admitting: Orthopedic Surgery

## 2023-01-06 ENCOUNTER — Other Ambulatory Visit: Payer: Self-pay

## 2023-01-06 ENCOUNTER — Encounter (HOSPITAL_COMMUNITY)
Admission: RE | Disposition: A | Discharge status: Home / Self Care | Payer: Self-pay | Source: Ambulatory Visit | Attending: Orthopedic Surgery

## 2023-01-06 ENCOUNTER — Ambulatory Visit (HOSPITAL_COMMUNITY): Payer: Medicare PPO | Admitting: Physician Assistant

## 2023-01-06 ENCOUNTER — Observation Stay (HOSPITAL_COMMUNITY): Payer: Medicare PPO

## 2023-01-06 ENCOUNTER — Encounter (HOSPITAL_COMMUNITY): Payer: Self-pay | Admitting: Orthopedic Surgery

## 2023-01-06 ENCOUNTER — Ambulatory Visit (HOSPITAL_COMMUNITY): Payer: Medicare PPO

## 2023-01-06 DIAGNOSIS — Z85828 Personal history of other malignant neoplasm of skin: Secondary | ICD-10-CM | POA: Diagnosis not present

## 2023-01-06 DIAGNOSIS — Z79899 Other long term (current) drug therapy: Secondary | ICD-10-CM | POA: Insufficient documentation

## 2023-01-06 DIAGNOSIS — M1712 Unilateral primary osteoarthritis, left knee: Secondary | ICD-10-CM

## 2023-01-06 DIAGNOSIS — N183 Chronic kidney disease, stage 3 unspecified: Secondary | ICD-10-CM | POA: Diagnosis not present

## 2023-01-06 DIAGNOSIS — N182 Chronic kidney disease, stage 2 (mild): Secondary | ICD-10-CM | POA: Diagnosis not present

## 2023-01-06 DIAGNOSIS — Z96652 Presence of left artificial knee joint: Secondary | ICD-10-CM | POA: Diagnosis not present

## 2023-01-06 DIAGNOSIS — E039 Hypothyroidism, unspecified: Secondary | ICD-10-CM | POA: Insufficient documentation

## 2023-01-06 DIAGNOSIS — G8918 Other acute postprocedural pain: Secondary | ICD-10-CM | POA: Diagnosis not present

## 2023-01-06 DIAGNOSIS — I129 Hypertensive chronic kidney disease with stage 1 through stage 4 chronic kidney disease, or unspecified chronic kidney disease: Secondary | ICD-10-CM | POA: Insufficient documentation

## 2023-01-06 DIAGNOSIS — Z471 Aftercare following joint replacement surgery: Secondary | ICD-10-CM | POA: Diagnosis not present

## 2023-01-06 DIAGNOSIS — R609 Edema, unspecified: Secondary | ICD-10-CM | POA: Diagnosis not present

## 2023-01-06 HISTORY — PX: TOTAL KNEE ARTHROPLASTY: SHX125

## 2023-01-06 LAB — ABO/RH: ABO/RH(D): A POS

## 2023-01-06 SURGERY — ARTHROPLASTY, KNEE, TOTAL
Anesthesia: Regional | Site: Knee | Laterality: Left

## 2023-01-06 MED ORDER — HYDROMORPHONE HCL 1 MG/ML IJ SOLN: 0.5000 mg | INTRAMUSCULAR | Status: DC | PRN

## 2023-01-06 MED ORDER — ACETAMINOPHEN 500 MG PO TABS
1000.0000 mg | ORAL_TABLET | Freq: Four times a day (QID) | ORAL | Status: AC
  Administered 2023-01-06 – 2023-01-07 (×4): 1000 mg via ORAL
  Filled 2023-01-06 (×4): qty 2

## 2023-01-06 MED ORDER — BUPIVACAINE IN DEXTROSE 0.75-8.25 % IT SOLN
INTRATHECAL | Status: DC | PRN
  Administered 2023-01-06: 1.8 mL via INTRATHECAL

## 2023-01-06 MED ORDER — OXYCODONE HCL 5 MG PO TABS
5.0000 mg | ORAL_TABLET | ORAL | Status: DC | PRN
  Administered 2023-01-07 (×2): 5 mg via ORAL
  Administered 2023-01-07: 10 mg via ORAL
  Filled 2023-01-06: qty 2
  Filled 2023-01-06 (×2): qty 1
  Filled 2023-01-06: qty 2

## 2023-01-06 MED ORDER — TRANEXAMIC ACID-NACL 1000-0.7 MG/100ML-% IV SOLN
1000.0000 mg | INTRAVENOUS | Status: AC
  Administered 2023-01-06: 1000 mg via INTRAVENOUS
  Filled 2023-01-06: qty 100

## 2023-01-06 MED ORDER — PHENYLEPHRINE HCL-NACL 20-0.9 MG/250ML-% IV SOLN
INTRAVENOUS | Status: DC | PRN
  Administered 2023-01-06: 25 ug/min via INTRAVENOUS

## 2023-01-06 MED ORDER — ACETAMINOPHEN 500 MG PO TABS
1000.0000 mg | ORAL_TABLET | Freq: Once | ORAL | Status: AC
  Administered 2023-01-06: 1000 mg via ORAL
  Filled 2023-01-06: qty 2

## 2023-01-06 MED ORDER — BUPIVACAINE LIPOSOME 1.3 % IJ SUSP: 20.0000 mL | Freq: Once | INTRAMUSCULAR | Status: DC

## 2023-01-06 MED ORDER — FENTANYL CITRATE PF 50 MCG/ML IJ SOSY: 25.0000 ug | PREFILLED_SYRINGE | INTRAMUSCULAR | Status: DC | PRN

## 2023-01-06 MED ORDER — LOSARTAN POTASSIUM 25 MG PO TABS
25.0000 mg | ORAL_TABLET | Freq: Every day | ORAL | Status: DC
  Administered 2023-01-07: 25 mg via ORAL
  Filled 2023-01-06: qty 1

## 2023-01-06 MED ORDER — ONDANSETRON HCL 4 MG/2ML IJ SOLN
INTRAMUSCULAR | Status: AC
  Filled 2023-01-06: qty 2

## 2023-01-06 MED ORDER — PROPOFOL 1000 MG/100ML IV EMUL
INTRAVENOUS | Status: AC
  Filled 2023-01-06: qty 100

## 2023-01-06 MED ORDER — LACTATED RINGERS IV SOLN: INTRAVENOUS | Status: DC

## 2023-01-06 MED ORDER — DOCUSATE SODIUM 100 MG PO CAPS
100.0000 mg | ORAL_CAPSULE | Freq: Two times a day (BID) | ORAL | Status: DC
  Administered 2023-01-06 – 2023-01-07 (×2): 100 mg via ORAL
  Filled 2023-01-06 (×2): qty 1

## 2023-01-06 MED ORDER — PROPOFOL 10 MG/ML IV BOLUS
INTRAVENOUS | Status: DC | PRN
Start: 2023-01-06 — End: 2023-01-06
  Administered 2023-01-06: 100 mg via INTRAVENOUS
  Administered 2023-01-06 (×2): 50 mg via INTRAVENOUS
  Administered 2023-01-06: 20 mg via INTRAVENOUS

## 2023-01-06 MED ORDER — CEFAZOLIN SODIUM-DEXTROSE 2-4 GM/100ML-% IV SOLN
2.0000 g | Freq: Four times a day (QID) | INTRAVENOUS | Status: AC
  Administered 2023-01-06 – 2023-01-07 (×2): 2 g via INTRAVENOUS
  Filled 2023-01-06 (×2): qty 100

## 2023-01-06 MED ORDER — ORAL CARE MOUTH RINSE: 15.0000 mL | Freq: Once | OROMUCOSAL | Status: AC

## 2023-01-06 MED ORDER — LEVOTHYROXINE SODIUM 125 MCG PO TABS
125.0000 ug | ORAL_TABLET | Freq: Every day | ORAL | Status: DC
  Administered 2023-01-07: 125 ug via ORAL
  Filled 2023-01-06: qty 1

## 2023-01-06 MED ORDER — BUPIVACAINE LIPOSOME 1.3 % IJ SUSP
INTRAMUSCULAR | Status: AC
  Filled 2023-01-06: qty 20

## 2023-01-06 MED ORDER — BUPIVACAINE-EPINEPHRINE 0.25% -1:200000 IJ SOLN
INTRAMUSCULAR | Status: AC
  Filled 2023-01-06: qty 1

## 2023-01-06 MED ORDER — PHENOL 1.4 % MT LIQD: 1.0000 | OROMUCOSAL | Status: DC | PRN

## 2023-01-06 MED ORDER — SODIUM CHLORIDE 0.9 % IV SOLN: INTRAVENOUS | Status: DC

## 2023-01-06 MED ORDER — ASPIRIN 81 MG PO CHEW
81.0000 mg | CHEWABLE_TABLET | Freq: Two times a day (BID) | ORAL | Status: DC
  Administered 2023-01-06 – 2023-01-07 (×2): 81 mg via ORAL
  Filled 2023-01-06 (×2): qty 1

## 2023-01-06 MED ORDER — SODIUM CHLORIDE 0.9 % IV SOLN
INTRAVENOUS | Status: DC | PRN
  Administered 2023-01-06: 50 mL

## 2023-01-06 MED ORDER — PROPOFOL 500 MG/50ML IV EMUL
INTRAVENOUS | Status: DC | PRN
  Administered 2023-01-06: 75 ug/kg/min via INTRAVENOUS

## 2023-01-06 MED ORDER — ONDANSETRON HCL 4 MG/2ML IJ SOLN
INTRAMUSCULAR | Status: DC | PRN
Start: 2023-01-06 — End: 2023-01-06
  Administered 2023-01-06: 4 mg via INTRAVENOUS

## 2023-01-06 MED ORDER — ZOLPIDEM TARTRATE 5 MG PO TABS
5.0000 mg | ORAL_TABLET | Freq: Every evening | ORAL | Status: DC | PRN
  Administered 2023-01-07: 5 mg via ORAL
  Filled 2023-01-06: qty 1

## 2023-01-06 MED ORDER — SODIUM CHLORIDE (PF) 0.9 % IJ SOLN
INTRAMUSCULAR | Status: AC
  Filled 2023-01-06: qty 30

## 2023-01-06 MED ORDER — B COMPLEX PO CAPS: 1.0000 | ORAL_CAPSULE | Freq: Every day | ORAL | Status: DC

## 2023-01-06 MED ORDER — POVIDONE-IODINE 10 % EX SWAB: 2.0000 | Freq: Once | CUTANEOUS | Status: DC

## 2023-01-06 MED ORDER — CEFAZOLIN SODIUM-DEXTROSE 2-4 GM/100ML-% IV SOLN
2.0000 g | INTRAVENOUS | Status: AC
  Administered 2023-01-06: 2 g via INTRAVENOUS
  Filled 2023-01-06: qty 100

## 2023-01-06 MED ORDER — DEXAMETHASONE SODIUM PHOSPHATE 10 MG/ML IJ SOLN
INTRAMUSCULAR | Status: DC | PRN
  Administered 2023-01-06: 10 mg

## 2023-01-06 MED ORDER — POLYETHYLENE GLYCOL 3350 17 G PO PACK
17.0000 g | PACK | Freq: Every day | ORAL | Status: DC | PRN
Start: 2023-01-06 — End: 2023-01-07

## 2023-01-06 MED ORDER — ROSUVASTATIN CALCIUM 5 MG PO TABS
5.0000 mg | ORAL_TABLET | Freq: Every day | ORAL | Status: DC
  Administered 2023-01-07: 5 mg via ORAL
  Filled 2023-01-06: qty 1

## 2023-01-06 MED ORDER — DEXAMETHASONE SODIUM PHOSPHATE 10 MG/ML IJ SOLN
8.0000 mg | Freq: Once | INTRAMUSCULAR | Status: AC
  Administered 2023-01-06: 10 mg via INTRAVENOUS

## 2023-01-06 MED ORDER — MIDAZOLAM HCL 2 MG/2ML IJ SOLN
1.0000 mg | INTRAMUSCULAR | Status: DC
  Filled 2023-01-06: qty 2

## 2023-01-06 MED ORDER — KETOROLAC TROMETHAMINE 15 MG/ML IJ SOLN
7.5000 mg | Freq: Four times a day (QID) | INTRAMUSCULAR | Status: AC
  Administered 2023-01-06 – 2023-01-07 (×4): 7.5 mg via INTRAVENOUS
  Filled 2023-01-06 (×4): qty 1

## 2023-01-06 MED ORDER — ROPIVACAINE HCL 5 MG/ML IJ SOLN
INTRAMUSCULAR | Status: DC | PRN
  Administered 2023-01-06: 20 mL via PERINEURAL

## 2023-01-06 MED ORDER — FENTANYL CITRATE PF 50 MCG/ML IJ SOSY
50.0000 ug | PREFILLED_SYRINGE | INTRAMUSCULAR | Status: DC
Start: 2023-01-06 — End: 2023-01-06
  Administered 2023-01-06: 50 ug via INTRAVENOUS
  Filled 2023-01-06: qty 2

## 2023-01-06 MED ORDER — DIPHENHYDRAMINE HCL 12.5 MG/5ML PO ELIX: 12.5000 mg | ORAL_SOLUTION | ORAL | Status: DC | PRN

## 2023-01-06 MED ORDER — VITAMIN D 25 MCG (1000 UNIT) PO TABS
2000.0000 [IU] | ORAL_TABLET | Freq: Every day | ORAL | Status: DC
  Filled 2023-01-06: qty 2

## 2023-01-06 MED ORDER — CHLORHEXIDINE GLUCONATE 0.12 % MT SOLN
15.0000 mL | Freq: Once | OROMUCOSAL | Status: AC
  Administered 2023-01-06: 15 mL via OROMUCOSAL

## 2023-01-06 MED ORDER — B COMPLEX-C PO TABS
1.0000 | ORAL_TABLET | Freq: Every day | ORAL | Status: DC
  Administered 2023-01-07: 1 via ORAL
  Filled 2023-01-06: qty 1

## 2023-01-06 MED ORDER — METHOCARBAMOL 1000 MG/10ML IJ SOLN: 500.0000 mg | Freq: Four times a day (QID) | INTRAMUSCULAR | Status: DC | PRN

## 2023-01-06 MED ORDER — WATER FOR IRRIGATION, STERILE IR SOLN
Status: DC | PRN
  Administered 2023-01-06: 1000 mL

## 2023-01-06 MED ORDER — PROPOFOL 10 MG/ML IV BOLUS
INTRAVENOUS | Status: AC
  Filled 2023-01-06: qty 20

## 2023-01-06 MED ORDER — 0.9 % SODIUM CHLORIDE (POUR BTL) OPTIME
TOPICAL | Status: DC | PRN
  Administered 2023-01-06: 1000 mL

## 2023-01-06 MED ORDER — PANTOPRAZOLE SODIUM 40 MG PO TBEC
40.0000 mg | DELAYED_RELEASE_TABLET | Freq: Every day | ORAL | Status: DC
  Administered 2023-01-06 – 2023-01-07 (×2): 40 mg via ORAL
  Filled 2023-01-06 (×2): qty 1

## 2023-01-06 MED ORDER — SODIUM CHLORIDE 0.9 % IR SOLN
Status: DC | PRN
  Administered 2023-01-06 (×2): 1000 mL

## 2023-01-06 MED ORDER — MENTHOL 3 MG MT LOZG: 1.0000 | LOZENGE | OROMUCOSAL | Status: DC | PRN

## 2023-01-06 MED ORDER — BUPIVACAINE-EPINEPHRINE 0.25% -1:200000 IJ SOLN
INTRAMUSCULAR | Status: DC | PRN
  Administered 2023-01-06: 30 mL

## 2023-01-06 MED ORDER — METHOCARBAMOL 500 MG PO TABS
500.0000 mg | ORAL_TABLET | Freq: Four times a day (QID) | ORAL | Status: DC | PRN
  Administered 2023-01-06: 500 mg via ORAL
  Filled 2023-01-06: qty 1

## 2023-01-06 MED ORDER — DEXAMETHASONE SODIUM PHOSPHATE 10 MG/ML IJ SOLN
INTRAMUSCULAR | Status: AC
  Filled 2023-01-06: qty 1

## 2023-01-06 MED ORDER — ONDANSETRON HCL 4 MG PO TABS: 4.0000 mg | ORAL_TABLET | Freq: Four times a day (QID) | ORAL | Status: DC | PRN

## 2023-01-06 MED ORDER — ISOPROPYL ALCOHOL 70 % SOLN
Status: AC
  Filled 2023-01-06: qty 480

## 2023-01-06 MED ORDER — ACETAMINOPHEN 325 MG PO TABS: 325.0000 mg | ORAL_TABLET | Freq: Four times a day (QID) | ORAL | Status: DC | PRN

## 2023-01-06 MED ORDER — ONDANSETRON HCL 4 MG/2ML IJ SOLN: 4.0000 mg | Freq: Four times a day (QID) | INTRAMUSCULAR | Status: DC | PRN

## 2023-01-06 MED ORDER — ISOPROPYL ALCOHOL 70 % SOLN
Status: DC | PRN
  Administered 2023-01-06: 1 via TOPICAL

## 2023-01-06 SURGICAL SUPPLY — 69 items
ADH SKN CLS APL DERMABOND .7 (GAUZE/BANDAGES/DRESSINGS) ×1
APL PRP STRL LF DISP 70% ISPRP (MISCELLANEOUS) ×2
BAG COUNTER SPONGE SURGICOUNT (BAG) IMPLANT
BAG SPNG CNTER NS LX DISP (BAG) ×1
BLADE SAG 18X100X1.27 (BLADE) ×1 IMPLANT
BLADE SAW SAG 35X64 .89 (BLADE) ×1 IMPLANT
BLADE SURG 15 STRL LF DISP TIS (BLADE) IMPLANT
BLADE SURG 15 STRL SS (BLADE) ×1
BNDG CMPR 5X3 CHSV STRCH STRL (GAUZE/BANDAGES/DRESSINGS) ×1
BNDG CMPR MED 10X6 ELC LF (GAUZE/BANDAGES/DRESSINGS) ×1
BNDG COHESIVE 3X5 TAN ST LF (GAUZE/BANDAGES/DRESSINGS) ×1 IMPLANT
BNDG ELASTIC 6X10 VLCR STRL LF (GAUZE/BANDAGES/DRESSINGS) ×1 IMPLANT
BOWL SMART MIX CTS (DISPOSABLE) ×1 IMPLANT
BSPLAT TIB 5D E CMNT STM LT (Knees) ×1 IMPLANT
CEMENT BONE R 1X40 (Cement) IMPLANT
CEMENT BONE REFOBACIN R1X40 US (Cement) IMPLANT
CHLORAPREP W/TINT 26 (MISCELLANEOUS) ×2 IMPLANT
COMP FEM CEMT PERSONA STD SZ7 (Knees) ×1 IMPLANT
COMPONENT FEM CMT PRSN STD SZ7 (Knees) IMPLANT
COVER SURGICAL LIGHT HANDLE (MISCELLANEOUS) ×1 IMPLANT
CUFF TOURN SGL QUICK 34 (TOURNIQUET CUFF) ×1
CUFF TRNQT CYL 34X4.125X (TOURNIQUET CUFF) ×1 IMPLANT
DERMABOND ADVANCED .7 DNX12 (GAUZE/BANDAGES/DRESSINGS) ×1 IMPLANT
DRAPE INCISE IOBAN 85X60 (DRAPES) ×1 IMPLANT
DRAPE SHEET LG 3/4 BI-LAMINATE (DRAPES) ×1 IMPLANT
DRAPE U-SHAPE 47X51 STRL (DRAPES) ×1 IMPLANT
DRESSING AQUACEL AG SP 3.5X10 (GAUZE/BANDAGES/DRESSINGS) ×1 IMPLANT
DRSG AQUACEL AG ADV 3.5X10 (GAUZE/BANDAGES/DRESSINGS) IMPLANT
DRSG AQUACEL AG SP 3.5X10 (GAUZE/BANDAGES/DRESSINGS) ×1
ELECT REM PT RETURN 15FT ADLT (MISCELLANEOUS) ×1 IMPLANT
GAUZE SPONGE 4X4 12PLY STRL (GAUZE/BANDAGES/DRESSINGS) ×1 IMPLANT
GLOVE BIO SURGEON STRL SZ 6.5 (GLOVE) ×2 IMPLANT
GLOVE BIOGEL PI IND STRL 6.5 (GLOVE) ×1 IMPLANT
GLOVE BIOGEL PI IND STRL 8 (GLOVE) ×1 IMPLANT
GLOVE SURG ORTHO 8.0 STRL STRW (GLOVE) ×2 IMPLANT
GOWN STRL REUS W/ TWL XL LVL3 (GOWN DISPOSABLE) ×2 IMPLANT
GOWN STRL REUS W/TWL XL LVL3 (GOWN DISPOSABLE) ×2
HANDPIECE INTERPULSE COAX TIP (DISPOSABLE) ×1
HOLDER FOLEY CATH W/STRAP (MISCELLANEOUS) ×1 IMPLANT
HOOD PEEL AWAY T7 (MISCELLANEOUS) ×3 IMPLANT
KIT TURNOVER KIT A (KITS) IMPLANT
MANIFOLD NEPTUNE II (INSTRUMENTS) ×1 IMPLANT
MARKER SKIN DUAL TIP RULER LAB (MISCELLANEOUS) ×1 IMPLANT
NS IRRIG 1000ML POUR BTL (IV SOLUTION) ×1 IMPLANT
PACK TOTAL KNEE CUSTOM (KITS) ×1 IMPLANT
PIN DRILL HDLS TROCAR 75 4PK (PIN) IMPLANT
SCREW HEADED 33MM KNEE (MISCELLANEOUS) IMPLANT
SET HNDPC FAN SPRY TIP SCT (DISPOSABLE) ×1 IMPLANT
SOLUTION IRRIG SURGIPHOR (IV SOLUTION) IMPLANT
SPIKE FLUID TRANSFER (MISCELLANEOUS) ×1 IMPLANT
STEM POLY PAT PLY 32M KNEE (Knees) IMPLANT
STEM TIB ST PERS 14+30 (Stem) IMPLANT
STEM TIBIA 5 DEG SZ E L KNEE (Knees) IMPLANT
STEM TIBIAL SZ6-7 E-F10 (Stem) IMPLANT
STRIP CLOSURE SKIN 1/2X4 (GAUZE/BANDAGES/DRESSINGS) ×1 IMPLANT
SUT MNCRL AB 3-0 PS2 18 (SUTURE) ×1 IMPLANT
SUT STRATAFIX 0 PDS 27 VIOLET (SUTURE) ×1
SUT STRATAFIX PDO 1 14 VIOLET (SUTURE) ×1
SUT STRATFX PDO 1 14 VIOLET (SUTURE) ×1
SUT VIC AB 2-0 CT2 27 (SUTURE) ×2 IMPLANT
SUTURE STRATFX 0 PDS 27 VIOLET (SUTURE) ×1 IMPLANT
SUTURE STRATFX PDO 1 14 VIOLET (SUTURE) ×1 IMPLANT
SYR 50ML LL SCALE MARK (SYRINGE) ×1 IMPLANT
TIBIA STEM 5 DEG SZ E L KNEE (Knees) ×1 IMPLANT
TRAY FOLEY MTR SLVR 14FR STAT (SET/KITS/TRAYS/PACK) IMPLANT
TRAY FOLEY MTR SLVR 16FR STAT (SET/KITS/TRAYS/PACK) IMPLANT
TUBE SUCTION HIGH CAP CLEAR NV (SUCTIONS) ×1 IMPLANT
UNDERPAD 30X36 HEAVY ABSORB (UNDERPADS AND DIAPERS) ×1 IMPLANT
WRAP KNEE MAXI GEL POST OP (GAUZE/BANDAGES/DRESSINGS) IMPLANT

## 2023-01-06 NOTE — Discharge Instructions (Signed)

## 2023-01-06 NOTE — Interval H&P Note (Signed)
The patient has been re-examined, and the chart reviewed, and there have been no interval changes to the documented history and physical.    Plan for L TKA for L knee OA  The operative side was examined and the patient was confirmed to have sensation to DPN, SPN, TN intact, Motor EHL, ext, flex 5/5, and DP 2+, PT 2+, No significant edema.   The risks, benefits, and alternatives have been discussed at length with patient, and the patient is willing to proceed.  Left knee marked. Consent has been signed.  

## 2023-01-06 NOTE — Progress Notes (Signed)
Orthopedic Tech Progress Note Patient Details:  Kimberly Bowers Jan 02, 1951 [id number]  Ortho Devices Type of Ortho Device: Bone foam zero knee Ortho Device/Splint Location: LLE Ortho Device/Splint Interventions: Ordered, Application   Post Interventions Patient Tolerated: Well Instructions Provided: Care of device  [place] A Gerilyn Pilgrim 01/06/2023, 3:28 PM

## 2023-01-06 NOTE — Anesthesia Procedure Notes (Signed)
Spinal  Patient location during procedure: OR Start time: 01/06/2023 1:04 PM End time: 01/06/2023 1:06 PM Reason for block: surgical anesthesia Staffing Performed: anesthesiologist  Anesthesiologist: Elmer Picker, MD Performed by: Elmer Picker, MD Authorized by: Elmer Picker, MD   Preanesthetic Checklist Completed: patient identified, IV checked, risks and benefits discussed, surgical consent, monitors and equipment checked, pre-op evaluation and timeout performed Spinal Block Patient position: sitting Prep: DuraPrep and site prepped and draped Patient monitoring: cardiac monitor, continuous pulse ox and blood pressure Approach: midline Location: L3-4 Injection technique: single-shot Needle Needle type: Pencan  Needle gauge: 24 G Needle length: 9 cm Assessment Sensory level: T6 Events: CSF return Additional Notes Functioning IV was confirmed and monitors were applied. Sterile prep and drape, including hand hygiene and sterile gloves were used. The patient was positioned and the spine was prepped. The skin was anesthetized with lidocaine.  Free flow of clear CSF was obtained prior to injecting local anesthetic into the CSF.  The spinal needle aspirated freely following injection.  The needle was carefully withdrawn.  The patient tolerated the procedure well.

## 2023-01-06 NOTE — Op Note (Signed)
DATE OF SURGERY:  01/06/2023 TIME: 2:40 PM  PATIENT NAME:  Kimberly Bowers   AGE: 72 y.o.    PRE-OPERATIVE DIAGNOSIS: End-stage left knee osteoarthritis  POST-OPERATIVE DIAGNOSIS:  Same  PROCEDURE: Left total Knee Arthroplasty  SURGEON:  Nicki Gracy A Mong Neal, MD   ASSISTANT: Darron Doom, RNFA, present and scrubbed throughout the case, critical for assistance with exposure, retraction, instrumentation, and closure.   OPERATIVE IMPLANTS:  Cemented Zimmer persona size 7 standard femur left, E left tibial baseplate cemented, 32 mm all poly patella, 10 mm MC poly insert, 30 mm stem extension for the tibial side. Implant Name Type Inv. Item Serial No. Manufacturer Lot No. LRB No. Used Action  CEMENT BONE R 1X40 - [id number] Cement CEMENT BONE R 1X40  ZIMMER RECON(ORTH,TRAU,BIO,SG) [id number] Left 2 Implanted  STEM TIB ST PERS 14+30 - [id number] Stem STEM TIB ST PERS 14+30  ZIMMER [employer]ON(ORTH,TRAU,BIO,SG) [id number] Left 1 Implanted  STEM POLY PAT PLY 37M KNEE - [id number] Knees STEM POLY PAT PLY 37M KNEE  ZIMMER RECON(ORTH,TRAU,BIO,SG) [id number] Left 1 Implanted  COMP FEM CEMT PERSONA STD SZ7 - [id number] Knees COMP FEM CEMT PERSONA STD SZ7  ZIMMER [employer]ON(ORTH,TRAU,BIO,SG) [id number] Left 1 Implanted  BSPLAT TIB 5D E CMNT STM LT - [id number] Knees BSPLAT TIB 5D E CMNT STM LT  ZIMMER RECON(ORTH,TRAU,BIO,SG) [id number] Left 1 Implanted  STEM TIBIAL SZ6-7 E-F10 - [id number] Stem STEM TIBIAL SZ6-7 E-F10  ZIMMER RECON(ORTH,TRAU,BIO,SG) [id number] Left 1 Implanted      PREOPERATIVE INDICATIONS:  Kimberly Bowers is a 72 y.o. year old female with end stage bone on bone degenerative arthritis of the knee who failed conservative treatment, including injections, antiinflammatories, activity modification, and assistive devices, and had significant impairment of their activities of daily living, and elected for Total Knee Arthroplasty.   The risks, benefits, and alternatives were discussed at length including but not  limited to the risks of infection, bleeding, nerve injury, stiffness, blood clots, the need for revision surgery, cardiopulmonary complications, among others, and they were willing to proceed.  OPERATIVE FINDINGS AND UNIQUE ASPECTS OF THE CASE: Severe valgus deformity with significant posterior lateral tibial bone loss, elected for stem extension for additional tibial fixation as well as drill holes into the sclerotic tibial bone  ESTIMATED BLOOD LOSS: 50cc  OPERATIVE DESCRIPTION:   Once adequate anesthesia was induced, preoperative antibiotics, 2 gm of ancef,1 gm of Tranexamic Acid, and 8 mg of Decadron administered, the patient was positioned supine with a left thigh tourniquet placed.  The left lower extremity was prepped and draped in sterile fashion.  A time-  out was performed identifying the patient, planned procedure, and the appropriate extremity.     The leg was  exsanguinated, tourniquet elevated to 250 mmHg.  A midline incision was  made followed by median parapatellar arthrotomy. Anterior horn of the medial meniscus was released and resected. A medial release was performed, the infrapatellar fat pad was resected with care taken to protect the patellar tendon. The suprapatellar fat was removed to exposed the distal anterior femur. The anterior horn of the lateral meniscus and ACL were released.    Following initial  exposure, I first started with the femur  The femoral  canal was opened with a drill, canal was suctioned to try to prevent fat emboli.  An  intramedullary rod was passed set at 5 degrees valgus, 10 mm. The distal femur was resected.  Following this resection, the tibia was  subluxated anteriorly.  Using the extramedullary guide, 10 mm of  bone was resected off   the proximal anterior lateral tibia.  We confirmed the gap would be  stable medially and laterally with a size 10mm spacer block as well as confirmed that the tibial cut was perpendicular in the coronal plane, checking  with an alignment rod.    Once this was done, the posterior femoral referencing femoral sizer was placed under to the posterior condyles with 4 degrees of external rotational which was parallel to the transepicondylar axis and perpendicular to [employer]. The femur was sized to be a size 7 in the anterior-  posterior dimension. The  anterior, posterior, and  chamfer cuts were made without difficulty nor   notching making certain that I was along the anterior cortex to help  with flexion gap stability. Next a laminar spreader was placed with the knee in flexion and the medial lateral menisci were resected.  5 cc of the Exparel mixture was injected in the medial side of the back of the knee and 3 cc in the lateral side.  1/2 inch curved osteotome was used to resect posterior osteophyte that was then removed with a pituitary rongeur.       At this point, the tibia was sized to be a size E.  The size E tray was  then pinned in position. Trial reduction was now carried with a 7 femur, E tibia, a 10mm MC insert.  The knee had full extension and was stable to varus valgus stress in extension.  The knee was slightly tight in flexion and the PCL was partially released.   Attention was next directed to the patella.  Precut  measurement was noted to be 19 mm.  I resected down to 13 mm and used a  32mm patellar button to restore patellar height as well as cover the cut surface.     The patella lug holes were drilled and a 32mm patella poly trial was placed.    The knee was brought to full extension with good flexion stability with the patella tracking through the trochlea without application of pressure.     Next the femoral component was again assessed and determined to be seated and appropriately lateralized.  The femoral lug holes were drilled.  The femoral component was then removed. Tibial component was again assessed and felt to be seated and appropriately rotated with the medial third of the tubercle.  The tibia was then drilled, and keel punched.     Final components were  opened and cement was mixed.      Final implants were then  cemented onto cleaned and dried cut surfaces of bone with the knee brought to extension with a 10mm MC poly.  The knee was irrigated with sterile Betadine diluted in saline as well as pulse lavage normal saline. The synovial lining was  then injected a dilute Exparel with 30cc of 0.25% marcaine with epinephrine.         Once the cement had fully cured, excess cement was removed throughout the knee.  I confirmed that I was satisfied with the range of motion and stability, and the final 10mm MC poly insert was chosen.  It was placed into the knee.         The tourniquet had been let down.  No significant hemostasis was required.  The medial parapatellar arthrotomy was then reapproximated using #1 Stratafix sutures with the knee  in flexion.  The remaining wound was closed with 0 stratafix, 2-0 Vicryl, and running 3-0  Monocryl. The knee was cleaned, dried, dressed sterilely using Dermabond and Aquacel dressing.  The patient was then brought to recovery room in stable condition, tolerating the procedure  well. There were no complications.   Post op recs: WB: WBAT Abx: ancef Imaging: PACU xrays DVT prophylaxis: Aspirin 81mg  BID x4 weeks Follow up: 2 weeks after surgery for a wound check with Dr. Blanchie Dessert at [facility].  Address: [address], [place], [place] [address]  Office Phone: [phone]  Weber Cooks, MD Orthopaedic Surgery

## 2023-01-06 NOTE — Anesthesia Procedure Notes (Signed)
Anesthesia Regional Block: Adductor canal block   Pre-Anesthetic Checklist: , timeout performed,  Correct Patient, Correct Site, Correct Laterality,  Correct Procedure, Correct Position, site marked,  Risks and benefits discussed,  Pre-op evaluation,  At surgeon's request and post-op pain management  Laterality: Left  Prep: Maximum Sterile Barrier Precautions used, chloraprep       Needles:  Injection technique: Single-shot  Needle Type: Echogenic Stimulator Needle     Needle Length: 9cm  Needle Gauge: 21     Additional Needles:   Procedures:,,,, ultrasound used (permanent image in chart),,    Narrative:  Start time: 01/06/2023 12:20 PM End time: 01/06/2023 12:22 PM Injection made incrementally with aspirations every 5 mL. Anesthesiologist: Elmer Picker, MD

## 2023-01-06 NOTE — Plan of Care (Signed)

## 2023-01-06 NOTE — Transfer of Care (Signed)
Immediate Anesthesia Transfer of Care Note  Patient: Kimberly Bowers  Procedure(s) Performed: TOTAL KNEE ARTHROPLASTY (Left: Knee)  Patient Location: PACU  Anesthesia Type:Spinal  Level of Consciousness: awake, alert , oriented, and patient cooperative  Airway & Oxygen Therapy: Patient Spontanous Breathing and Patient connected to nasal cannula oxygen  Post-op Assessment: Report given to RN and Post -op Vital signs reviewed and stable  Post vital signs: Reviewed and stable  Last Vitals:  Vitals Value Taken Time  BP 134/90 01/06/23 1513  Temp    Pulse 81 01/06/23 1515  Resp 21 01/06/23 1515  SpO2 98 % 01/06/23 1515  Vitals shown include unfiled device data.  Last Pain:  Vitals:   01/06/23 1245  TempSrc:   PainSc: 0-No pain         Complications: No notable events documented.

## 2023-01-06 NOTE — Anesthesia Preprocedure Evaluation (Signed)
Anesthesia Evaluation  Patient identified by MRN, date of birth, ID band Patient awake    Reviewed: Allergy & Precautions, NPO status , Patient's Chart, lab work & pertinent test results  History of Anesthesia Complications (+) PONV and history of anesthetic complications  Airway Mallampati: II  TM Distance: >3 FB Neck ROM: Full    Dental no notable dental hx. (+) Teeth Intact, Dental Advisory Given   Pulmonary neg pulmonary ROS   Pulmonary exam normal breath sounds clear to auscultation       Cardiovascular hypertension, Pt. on medications Normal cardiovascular exam Rhythm:Regular Rate:Normal  TTE 2022 1. Left ventricular ejection fraction, by estimation, is 55 to 60%. Left  ventricular ejection fraction by 3D volume is 55 %. The left ventricle has  normal function. The left ventricle has no regional wall motion  abnormalities. There is mild concentric  left ventricular hypertrophy. Left ventricular diastolic parameters are  consistent with Grade I diastolic dysfunction (impaired relaxation).   2. Right ventricular systolic function is normal. The right ventricular  size is normal. There is normal pulmonary artery systolic pressure. The  estimated right ventricular systolic pressure is 24.3 mmHg.   3. The mitral valve is grossly normal. Mild mitral valve regurgitation.  No evidence of mitral stenosis.   4. The aortic valve is tricuspid. Aortic valve regurgitation is not  visualized. No aortic stenosis is present.   5. Aortic dilatation noted. There is mild dilatation of the ascending  aorta, measuring 41 mm.   6. The inferior vena cava is normal in size with greater than 50%  respiratory variability, suggesting right atrial pressure of 3 mmHg.     Neuro/Psych  PSYCHIATRIC DISORDERS Anxiety     negative neurological ROS     GI/Hepatic negative GI ROS, Neg liver ROS,,,  Endo/Other  Hypothyroidism    Renal/GU Renal  InsufficiencyRenal diseaseLab Results      Component                Value               Date                      NA                       135                 12/24/2022                CL                       100                 12/24/2022                K                        3.7                 12/24/2022                CO2                      26                  12/24/2022  BUN                      20                  12/24/2022                CREATININE               0.90                12/24/2022                GFRNONAA                 >60                 12/24/2022                CALCIUM                  9.4                 12/24/2022                ALBUMIN                  4.5                 12/24/2022                GLUCOSE                  96                  12/24/2022             negative genitourinary   Musculoskeletal  (+) Arthritis ,    Abdominal   Peds  Hematology negative hematology ROS (+)   Anesthesia Other Findings   Reproductive/Obstetrics                             Anesthesia Physical Anesthesia Plan  ASA: 3  Anesthesia Plan: Spinal and Regional   Post-op Pain Management: Regional block* and Tylenol PO (pre-op)*   Induction:   PONV Risk Score and Plan: 3 and Treatment may vary due to age or medical condition, Propofol infusion, Ondansetron and Dexamethasone  Airway Management Planned: Natural Airway  Additional Equipment:   Intra-op Plan:   Post-operative Plan:   Informed Consent: I have reviewed the patients History and Physical, chart, labs and discussed the procedure including the risks, benefits and alternatives for the proposed anesthesia with the patient or authorized representative who has indicated his/her understanding and acceptance.     Dental advisory given  Plan Discussed with: CRNA  Anesthesia Plan Comments:        Anesthesia Quick Evaluation

## 2023-01-06 NOTE — Anesthesia Procedure Notes (Signed)
Procedure Name: MAC Date/Time: 01/06/2023 12:56 PM  Performed by: Maurene Capes, CRNAPre-anesthesia Checklist: Patient identified, Emergency Drugs available, Suction available and Patient being monitored Patient Re-evaluated:Patient Re-evaluated prior to induction Oxygen Delivery Method: Simple face mask Preoxygenation: Pre-oxygenation with 100% oxygen Induction Type: IV induction Placement Confirmation: positive ETCO2 Dental Injury: Teeth and Oropharynx as per pre-operative assessment

## 2023-01-07 ENCOUNTER — Encounter (HOSPITAL_COMMUNITY): Payer: Self-pay | Admitting: Orthopedic Surgery

## 2023-01-07 DIAGNOSIS — Z96652 Presence of left artificial knee joint: Secondary | ICD-10-CM | POA: Diagnosis not present

## 2023-01-07 DIAGNOSIS — Z79899 Other long term (current) drug therapy: Secondary | ICD-10-CM | POA: Diagnosis not present

## 2023-01-07 DIAGNOSIS — M1712 Unilateral primary osteoarthritis, left knee: Secondary | ICD-10-CM | POA: Diagnosis not present

## 2023-01-07 DIAGNOSIS — N183 Chronic kidney disease, stage 3 unspecified: Secondary | ICD-10-CM | POA: Diagnosis not present

## 2023-01-07 DIAGNOSIS — I129 Hypertensive chronic kidney disease with stage 1 through stage 4 chronic kidney disease, or unspecified chronic kidney disease: Secondary | ICD-10-CM | POA: Diagnosis not present

## 2023-01-07 DIAGNOSIS — E039 Hypothyroidism, unspecified: Secondary | ICD-10-CM | POA: Diagnosis not present

## 2023-01-07 DIAGNOSIS — Z85828 Personal history of other malignant neoplasm of skin: Secondary | ICD-10-CM | POA: Diagnosis not present

## 2023-01-07 LAB — CBC
HCT: 36 % (ref 36.0–46.0)
Hemoglobin: 11.9 g/dL — ABNORMAL LOW (ref 12.0–15.0)
MCH: 34 pg (ref 26.0–34.0)
MCHC: 33.1 g/dL (ref 30.0–36.0)
MCV: 102.9 fL — ABNORMAL HIGH (ref 80.0–100.0)
Platelets: 224 10*3/uL (ref 150–400)
RBC: 3.5 MIL/uL — ABNORMAL LOW (ref 3.87–5.11)
RDW: 12.5 % (ref 11.5–15.5)
WBC: 9.1 10*3/uL (ref 4.0–10.5)
nRBC: 0 % (ref 0.0–0.2)

## 2023-01-07 LAB — BASIC METABOLIC PANEL
Anion gap: 7 (ref 5–15)
BUN: 21 mg/dL (ref 8–23)
CO2: 25 mmol/L (ref 22–32)
Calcium: 8.8 mg/dL — ABNORMAL LOW (ref 8.9–10.3)
Chloride: 105 mmol/L (ref 98–111)
Creatinine, Ser: 1.13 mg/dL — ABNORMAL HIGH (ref 0.44–1.00)
GFR, Estimated: 51 mL/min — ABNORMAL LOW (ref >60)
Glucose, Bld: 144 mg/dL — ABNORMAL HIGH (ref 70–99)
Potassium: 4.2 mmol/L (ref 3.5–5.1)
Sodium: 137 mmol/L (ref 135–145)

## 2023-01-07 MED ORDER — METHOCARBAMOL 500 MG PO TABS
500.0000 mg | ORAL_TABLET | Freq: Three times a day (TID) | ORAL | 0 refills | Status: AC | PRN
Start: 2023-01-07 — End: 2023-01-17

## 2023-01-07 MED ORDER — ONDANSETRON HCL 4 MG PO TABS: 4.0000 mg | ORAL_TABLET | Freq: Three times a day (TID) | ORAL | 0 refills | Status: AC | PRN

## 2023-01-07 MED ORDER — OXYCODONE HCL 5 MG PO TABS: 5.0000 mg | ORAL_TABLET | ORAL | 0 refills | Status: AC | PRN

## 2023-01-07 MED ORDER — CELECOXIB 100 MG PO CAPS: 100.0000 mg | ORAL_CAPSULE | Freq: Two times a day (BID) | ORAL | 0 refills | Status: AC

## 2023-01-07 MED ORDER — ASPIRIN 81 MG PO TBEC: 81.0000 mg | DELAYED_RELEASE_TABLET | Freq: Two times a day (BID) | ORAL | Status: AC

## 2023-01-07 NOTE — TOC Transition Note (Signed)
Transition of Care [facility]) - CM/SW Discharge Note  Patient Details  Name: Kimberly Bowers MRN: [id number] Date of Birth: [birth year]  Transition of Care [facility]) CM/SW Contact:  Ewing Schlein, LCSW Phone Number: 01/07/2023, 11:00 AM  Clinical Narrative: Patient is expected to discharge home after working with PT. CSW met with patient and husband, Kinzli Tauzin, to confirm discharge plan. Patient will go home with HHPT, which was prearranged with [facility]. Patient will need a rolling walker, which [employer] delivered to patient's room. TOC signing off.  Final next level of care: Home w Home Health Services Barriers to Discharge: No Barriers Identified  Patient Goals and CMS Choice CMS Medicare.gov Compare Post Acute Care list provided to:: Patient Choice offered to / list presented to : Patient  Discharge Plan and Services Additional resources added to the After Visit Summary for           DME Arranged: Walker rolling DME Agency: [employer] Representative spoke with at DME Agency: Prearranged in orthopedist's office HH Arranged: PT HH Agency: [facility] ([facility]) Representative spoke with at [facility] Agency: Prearranged in orthopedist's office  Social Determinants of Health (SDOH) Interventions SDOH Screenings   Food Insecurity: No Food Insecurity (01/06/2023)  Housing: Low Risk  (01/06/2023)  Transportation Needs: No Transportation Needs (01/06/2023)  Utilities: Not At Risk (01/06/2023)  Depression (PHQ2-9): Low Risk  (07/15/2022)  Tobacco Use: Low Risk  (01/06/2023)   Readmission Risk Interventions     No data to display

## 2023-01-07 NOTE — Evaluation (Signed)
Physical Therapy Evaluation Patient Details Name: Kimberly Bowers MRN: [id number] DOB: February 24, 1951 Today's Date: 01/07/2023  History of Present Illness  72 yo female s/p L TKA 01/06/23  Clinical Impression  On eval, pt was Min A for mobility. She walked ~100 feet with a RW. Moderate pain with activity. Will plan to have a 2nd session prior to d/c home later today.         If plan is discharge home, recommend the following: A little help with walking and/or transfers;A little help with bathing/dressing/bathroom;Assistance with cooking/housework;Assist for transportation;Help with stairs or ramp for entrance   Can travel by private vehicle        Equipment Recommendations Rolling walker (2 wheels)  Recommendations for Other Services       Functional Status Assessment Patient has had a recent decline in their functional status and demonstrates the ability to make significant improvements in function in a reasonable and predictable amount of time.     Precautions / Restrictions Precautions Precautions: Knee Restrictions Weight Bearing Restrictions: No LLE Weight Bearing: Weight bearing as tolerated      Mobility  Bed Mobility Overal bed mobility: Needs Assistance Bed Mobility: Supine to Sit     Supine to sit: Supervision, HOB elevated          Transfers Overall transfer level: Needs assistance Equipment used: Rolling walker (2 wheels) Transfers: Sit to/from Stand Sit to Stand: Min assist           General transfer comment: Small amount of assist to power up. Cues for safety, technique, hand/LE placement.    Ambulation/Gait Ambulation/Gait assistance: Contact guard assist Gait Distance (Feet): 100 Feet Assistive device: Rolling walker (2 wheels) Gait Pattern/deviations: Step-to pattern       General Gait Details: Cues for safety, sequencing, pacing. Tolerated distance well. Denied dizziness.  Stairs            Wheelchair Mobility     Tilt Bed     Modified Rankin (Stroke Patients Only)       Balance Overall balance assessment: Needs assistance         Standing balance support: Bilateral upper extremity supported, Reliant on assistive device for balance Standing balance-Leahy Scale: Fair                               Pertinent Vitals/Pain Pain Assessment Pain Assessment: 0-10 Pain Score: 6  Pain Location: L knee Pain Descriptors / Indicators: Aching, Sharp Pain Intervention(s): Limited activity within patient's tolerance, Monitored during session, Ice applied, Repositioned    Home Living Family/patient expects to be discharged to:: Private residence Living Arrangements: Spouse/significant other Available Help at Discharge: Family Type of Home: House Home Access: Stairs to enter   Secretary/administrator of Steps: 2 Alternate Level Stairs-Number of Steps: 1 flight Home Layout: Two level Home Equipment:  ("stair cane")      Prior Function Prior Level of Function : Independent/Modified Independent                     Extremity/Trunk Assessment   Upper Extremity Assessment Upper Extremity Assessment: Overall WFL for tasks assessed    Lower Extremity Assessment Lower Extremity Assessment: Generalized weakness    Cervical / Trunk Assessment Cervical / Trunk Assessment: Normal  Communication   Communication Communication: No apparent difficulties  Cognition Arousal: Alert Behavior During Therapy: WFL for tasks assessed/performed Overall Cognitive Status: Within Functional Limits for tasks assessed  General Comments      Exercises Total Joint Exercises Ankle Circles/Pumps: AROM, Both, 10 reps Quad Sets: AROM, Left, 10 reps Heel Slides: AAROM, Left, 10 reps Hip ABduction/ADduction: AAROM, Left, 10 reps Straight Leg Raises: AAROM, Left, 10 reps Goniometric ROM: ~5-70 degrees   Assessment/Plan    PT Assessment Patient  needs continued PT services  PT Problem List Decreased strength;Decreased range of motion;Decreased activity tolerance;Decreased balance;Decreased mobility;Decreased knowledge of use of DME;Pain       PT Treatment Interventions DME instruction;Gait training;Functional mobility training;Stair training;Therapeutic activities;Therapeutic exercise;Balance training;Patient/family education    PT Goals (Current goals can be found in the Care Plan section)  Acute Rehab PT Goals Patient Stated Goal: regain independence; less pain PT Goal Formulation: With patient/family Time For Goal Achievement: 01/21/23 Potential to Achieve Goals: Good    Frequency 7X/week     Co-evaluation               AM-PAC PT "6 Clicks" Mobility  Outcome Measure Help needed turning from your back to your side while in a flat bed without using bedrails?: A Little Help needed moving from lying on your back to sitting on the side of a flat bed without using bedrails?: A Little Help needed moving to and from a bed to a chair (including a wheelchair)?: A Little Help needed standing up from a chair using your arms (e.g., wheelchair or bedside chair)?: A Little Help needed to walk in hospital room?: A Little Help needed climbing 3-5 steps with a railing? : A Little 6 Click Score: 18    End of Session Equipment Utilized During Treatment: Gait belt Activity Tolerance: Patient tolerated treatment well Patient left: in chair;with call bell/phone within reach;with family/visitor present   PT Visit Diagnosis: Pain;Other abnormalities of gait and mobility (R26.89) Pain - Right/Left: Left Pain - part of body: Knee    Time: 5638-7564 PT Time Calculation (min) (ACUTE ONLY): 35 min   Charges:   PT Evaluation $PT Eval Low Complexity: 1 Low PT Treatments $Gait Training: 8-22 mins PT General Charges $$ ACUTE PT VISIT: 1 Visit            Faye Ramsay, PT Acute Rehabilitation  Office: [phone]

## 2023-01-07 NOTE — Progress Notes (Signed)
Physical Therapy Treatment Patient Details Name: Kimberly Bowers MRN: [id number] DOB: 06-20-1950 Today's Date: 01/07/2023   History of Present Illness 72 yo female s/p L TKA 01/06/23    PT Comments  2nd session to practice stair negotiation. PT education completed. Plan is for HHPT f/u after discharge.    If plan is discharge home, recommend the following: A little help with walking and/or transfers;A little help with bathing/dressing/bathroom;Assistance with cooking/housework;Assist for transportation;Help with stairs or ramp for entrance   Can travel by private vehicle        Equipment Recommendations  Rolling walker (2 wheels)    Recommendations for Other Services       Precautions / Restrictions Precautions Precautions: Knee Restrictions Weight Bearing Restrictions: No LLE Weight Bearing: Weight bearing as tolerated     Mobility  Bed Mobility Overal bed mobility: Needs Assistance Bed Mobility: Supine to Sit     Supine to sit: Supervision, HOB elevated     General bed mobility comments: oob in recliner    Transfers Overall transfer level: Needs assistance Equipment used: Rolling walker (2 wheels) Transfers: Sit to/from Stand Sit to Stand: Supervision           General transfer comment: Cues for safety, technique, hand/LE placement.    Ambulation/Gait Ambulation/Gait assistance: Supervision Gait Distance (Feet): 125 Feet Assistive device: Rolling walker (2 wheels) Gait Pattern/deviations: Step-to pattern, Step-through pattern       General Gait Details: Intermittent step through gait pattern but pt feels like knee has some instability/weakness.   Stairs Stairs: Yes Stairs assistance: Contact guard assist Stair Management: Step to pattern, Forwards Number of Stairs: 7 General stair comments: Practiced x 1 to simulate home entry-with use of railings. Practiced x 1 to simulate getting up to /from bedroom-with use of 1 rail + cane. Cues for safety,  technique, sequencing.   Wheelchair Mobility     Tilt Bed    Modified Rankin (Stroke Patients Only)       Balance Overall balance assessment: Needs assistance         Standing balance support: Bilateral upper extremity supported, Reliant on assistive device for balance Standing balance-Leahy Scale: Fair                              Cognition Arousal: Alert Behavior During Therapy: WFL for tasks assessed/performed Overall Cognitive Status: Within Functional Limits for tasks assessed                                          Exercises     General Comments        Pertinent Vitals/Pain Pain Assessment Pain Assessment: 0-10 Pain Score: 6  Pain Location: L knee Pain Descriptors / Indicators: Aching, Sharp Pain Intervention(s): Limited activity within patient's tolerance, Monitored during session, Ice applied, Repositioned    Home Living Family/patient expects to be discharged to:: Private residence Living Arrangements: Spouse/significant other Available Help at Discharge: Family Type of Home: House Home Access: Stairs to enter   Secretary/administrator of Steps: 2 Alternate Level Stairs-Number of Steps: 1 flight Home Layout: Two level Home Equipment:  ("stair cane")      Prior Function            PT Goals (current goals can now be found in the care plan section) Acute Rehab PT Goals Patient Stated  Goal: regain independence; less pain PT Goal Formulation: With patient/family Time For Goal Achievement: 01/21/23 Potential to Achieve Goals: Good Progress towards PT goals: Progressing toward goals    Frequency    7X/week      PT Plan      Co-evaluation              AM-PAC PT "6 Clicks" Mobility   Outcome Measure  Help needed turning from your back to your side while in a flat bed without using bedrails?: A Little Help needed moving from lying on your back to sitting on the side of a flat bed without using  bedrails?: A Little Help needed moving to and from a bed to a chair (including a wheelchair)?: A Little Help needed standing up from a chair using your arms (e.g., wheelchair or bedside chair)?: A Little Help needed to walk in hospital room?: A Little Help needed climbing 3-5 steps with a railing? : A Little 6 Click Score: 18    End of Session Equipment Utilized During Treatment: Gait belt Activity Tolerance: Patient tolerated treatment well Patient left: in chair;with call bell/phone within reach   PT Visit Diagnosis: Pain;Other abnormalities of gait and mobility (R26.89) Pain - Right/Left: Left Pain - part of body: Knee     Time: 1324-4010 PT Time Calculation (min) (ACUTE ONLY): 23 min  Charges:    $Gait Training: 23-37 mins PT General Charges $$ ACUTE PT VISIT: 1 Visit                        Faye Ramsay, PT Acute Rehabilitation  Office: [phone]

## 2023-01-07 NOTE — Plan of Care (Signed)
  Problem: Education: Goal: Knowledge of General Education information will improve Description Including pain rating scale, medication(s)/side effects and non-pharmacologic comfort measures Outcome: Progressing   Problem: Health Behavior/Discharge Planning: Goal: Ability to manage health-related needs will improve Outcome: Progressing   

## 2023-01-07 NOTE — Discharge Summary (Signed)
Physician Discharge Summary  Patient ID: Kimberly Bowers MRN: [id number] DOB/AGE: [birth year] 74 y.o.  Admit date: 01/06/2023 Discharge date: 01/07/2023  Admission Diagnoses:  Localized osteoarthritis of left knee  Discharge Diagnoses:  Principal Problem:   Localized osteoarthritis of left knee   Past Medical History:  Diagnosis Date   Allergy    Aortic aneurysm, thoracic (HCC)    Mild   Arthritis    Cancer (HCC)    Skin cancer: leg   Cataract    CKD (chronic kidney disease) stage 3, GFR 30-59 ml/min (HCC) 01/11/2020   stage 2: 12/24/22   Diverticulitis 2004   Heart murmur    Just fgor couple years per pt   HTN (hypertension) 12/13/2022   B/P severe,    B/,P 160s,  DB/P running >110   Hypothyroid    Labile hypertension 02/07/2021   Neutropenia (HCC)    PONV (postoperative nausea and vomiting)     Surgeries: Procedure(s): TOTAL KNEE ARTHROPLASTY on 01/06/2023   Consultants (if any):   Discharged Condition: Improved  Hospital Course: Kimberly Bowers is an 72 y.o. female who was admitted 01/06/2023 with a diagnosis of Localized osteoarthritis of left knee and went to the operating room on 01/06/2023 and underwent the above named procedures.    She was given perioperative antibiotics:  Anti-infectives (From admission, onward)    Start     Dose/Rate Route Frequency Ordered Stop   01/06/23 1900  ceFAZolin (ANCEF) IVPB 2g/100 mL premix        2 g 200 mL/hr over 30 Minutes Intravenous Every 6 hours 01/06/23 1628 01/07/23 0038   01/06/23 1000  ceFAZolin (ANCEF) IVPB 2g/100 mL premix        2 g 200 mL/hr over 30 Minutes Intravenous On call to O.R. 01/06/23 0950 01/06/23 1320     .  She was given sequential compression devices, early ambulation, and aspirin for DVT prophylaxis.  She benefited maximally from the hospital stay and there were no complications.    Recent vital signs:  Vitals:   01/07/23 0157 01/07/23 0513  BP: 129/85 132/87  Pulse: 81 76  Resp: 16 15   Temp: 98.3 F (36.8 C) 97.8 F (36.6 C)  SpO2: 96% 96%    Recent laboratory studies:  Lab Results  Component Value Date   HGB 11.9 (L) 01/07/2023   HGB 13.9 12/24/2022   HGB 14.3 10/21/2022   Lab Results  Component Value Date   WBC 9.1 01/07/2023   PLT 224 01/07/2023   No results found for: "INR" Lab Results  Component Value Date   NA 137 01/07/2023   K 4.2 01/07/2023   CL 105 01/07/2023   CO2 25 01/07/2023   BUN 21 01/07/2023   CREATININE 1.13 (H) 01/07/2023   GLUCOSE 144 (H) 01/07/2023    Discharge Medications:   Allergies as of 01/07/2023       Reactions   Quinolones    Aortic aneurysm        Medication List     TAKE these medications    aspirin EC 81 MG tablet Take 1 tablet (81 mg total) by mouth 2 (two) times daily for 28 days. Swallow whole. Start taking on: January 08, 2023   B COMPLEX PO Take 1 tablet by mouth daily.   Benefiber Powd Take 1 Dose by mouth daily. 1 dose = 1 tablespoon   celecoxib 100 MG capsule Commonly known as: CeleBREX Take 1 capsule (100 mg total) by mouth 2 (two) times  daily for 14 days.   levothyroxine 125 MCG tablet Commonly known as: Synthroid Take 1 tablet by mouth once a day on an empty stomach with only water for 30 minutes, no antacids, calcium, magnesium for 4 hours & avoid biotin   losartan 25 MG tablet Commonly known as: Cozaar Take 1 tablet (25 mg total) by mouth daily.   MAGNESIUM PO Take 150 mg by mouth daily.   methocarbamol 500 MG tablet Commonly known as: ROBAXIN Take 1 tablet (500 mg total) by mouth every 8 (eight) hours as needed for up to 10 days for muscle spasms.   OMEGA 3 500 PO Take 500 mg by mouth daily.   ondansetron 4 MG tablet Commonly known as: Zofran Take 1 tablet (4 mg total) by mouth every 8 (eight) hours as needed for up to 14 days for nausea or vomiting.   OVER THE COUNTER MEDICATION Take 1 tablet by mouth daily. Intestinal formula #1 supplement   oxyCODONE 5 MG immediate  release tablet Commonly known as: Roxicodone Take 1 tablet (5 mg total) by mouth every 4 (four) hours as needed for up to 7 days for severe pain (pain score 7-10) or moderate pain (pain score 4-6).   rosuvastatin 5 MG tablet Commonly known as: Crestor Take 1 tab daily to reduce risk of heart attack and stroke.   Vitamin D 50 MCG (2000 UT) tablet Take 2,000 Units by mouth daily.   Wegovy 2.4 MG/0.75ML Soaj Generic drug: Semaglutide-Weight Management Inject 2.4 mg into the skin once a week.        Diagnostic Studies: DG Knee Left Port  Result Date: 01/06/2023 CLINICAL DATA:  Postoperative state. EXAM: PORTABLE LEFT KNEE - 1-2 VIEW COMPARISON:  None Available. FINDINGS: Left knee arthroplasty in expected alignment. No periprosthetic lucency or fracture. There has been patellar resurfacing. Recent postsurgical change includes air and edema in the soft tissues and joint space. IMPRESSION: Left knee arthroplasty without immediate postoperative complication. Electronically Signed   By: Narda Rutherford M.D.   On: 01/06/2023 16:30    Disposition:      Follow-up Information     Joen Laura, MD. Go on 01/21/2023.   Specialty: Orthopedic Surgery Why: your appointment is scheduled for 2:45 Contact information: [address] [place] [address] [phone]         [facility] Follow up.   Why: HHPT will provide 6 home visits prior to starting outpatient physical therapy        [facility], [place]. Go on 01/22/2023.   Specialty: Physical Therapy Why: your outpatient physical therapy has been ordered. The office will call you with a time Contact information: [address] [place] [address] [phone]                    Discharge Instructions      INSTRUCTIONS AFTER JOINT REPLACEMENT   Remove items at home which could result in a fall. This includes throw rugs or furniture in walking pathways ICE to the affected  joint every three hours while awake for 30 minutes at a time, for at least the first 3-5 days, and then as needed for pain and swelling.  Continue to use ice for pain and swelling. You may notice swelling that will progress down to the foot and ankle.  This is normal after surgery.  Elevate your leg when you are not up walking on it.   Continue to use the breathing machine you got  in the hospital (incentive spirometer) which will help keep your temperature down.  It is common for your temperature to cycle up and down following surgery, especially at night when you are not up moving around and exerting yourself.  The breathing machine keeps your lungs expanded and your temperature down.   DIET:  As you were doing prior to hospitalization, we recommend a well-balanced diet.  DRESSING / WOUND CARE / SHOWERING  Keep the surgical dressing until follow up.  The dressing is water proof, so you can shower without any extra covering.  IF THE DRESSING FALLS OFF or the wound gets wet inside, change the dressing with sterile gauze.  Please use good hand washing techniques before changing the dressing.  Do not use any lotions or creams on the incision until instructed by your surgeon.    ACTIVITY  Increase activity slowly as tolerated, but follow the weight bearing instructions below.   No driving for 6 weeks or until further direction given by your physician.  You cannot drive while taking narcotics.  No lifting or carrying greater than 10 lbs. until further directed by your surgeon. Avoid periods of inactivity such as sitting longer than an hour when not asleep. This helps prevent blood clots.  You may return to work once you are authorized by your doctor.     WEIGHT BEARING   Weight bearing as tolerated with assist device (walker, cane, etc) as directed, use it as long as suggested by your surgeon or therapist, typically at least 4-6 weeks.   EXERCISES  Results after joint replacement surgery are  often greatly improved when you follow the exercise, range of motion and muscle strengthening exercises prescribed by your doctor. Safety measures are also important to protect the joint from further injury. Any time any of these exercises cause you to have increased pain or swelling, decrease what you are doing until you are comfortable again and then slowly increase them. If you have problems or questions, call your caregiver or physical therapist for advice.   Rehabilitation is important following a joint replacement. After just a few days of immobilization, the muscles of the leg can become weakened and shrink (atrophy).  These exercises are designed to build up the tone and strength of the thigh and leg muscles and to improve motion. Often times heat used for twenty to thirty minutes before working out will loosen up your tissues and help with improving the range of motion but do not use heat for the first two weeks following surgery (sometimes heat can increase post-operative swelling).   These exercises can be done on a training (exercise) mat, on the floor, on a table or on a bed. Use whatever works the best and is most comfortable for you.    Use music or television while you are exercising so that the exercises are a pleasant break in your day. This will make your life better with the exercises acting as a break in your routine that you can look forward to.   Perform all exercises about fifteen times, three times per day or as directed.  You should exercise both the operative leg and the other leg as well.  Exercises include:   Quad Sets - Tighten up the muscle on the front of the thigh (Quad) and hold for 5-10 seconds.   Straight Leg Raises - With your knee straight (if you were given a brace, keep it on), lift the leg to 60 degrees, hold for 3 seconds, and  slowly lower the leg.  Perform this exercise against resistance later as your leg gets stronger.  Leg Slides: Lying on your back, slowly  slide your foot toward your buttocks, bending your knee up off the floor (only go as far as is comfortable). Then slowly slide your foot back down until your leg is flat on the floor again.  Angel Wings: Lying on your back spread your legs to the side as far apart as you can without causing discomfort.  Hamstring Strength:  Lying on your back, push your heel against the floor with your leg straight by tightening up the muscles of your buttocks.  Repeat, but this time bend your knee to a comfortable angle, and push your heel against the floor.  You may put a pillow under the heel to make it more comfortable if necessary.   A rehabilitation program following joint replacement surgery can speed recovery and prevent re-injury in the future due to weakened muscles. Contact your doctor or a physical therapist for more information on knee rehabilitation.    CONSTIPATION  Constipation is defined medically as fewer than three stools per week and severe constipation as less than one stool per week.  Even if you have a regular bowel pattern at home, your normal regimen is likely to be disrupted due to multiple reasons following surgery.  Combination of anesthesia, postoperative narcotics, change in appetite and fluid intake all can affect your bowels.   YOU MUST use at least one of the following options; they are listed in order of increasing strength to get the job done.  They are all available over the counter, and you may need to use some, POSSIBLY even all of these options:    Drink plenty of fluids (prune juice may be helpful) and high fiber foods Colace 100 mg by mouth twice a day  Senokot for constipation as directed and as needed Dulcolax (bisacodyl), take with full glass of water  Miralax (polyethylene glycol) once or twice a day as needed.  If you have tried all these things and are unable to have a bowel movement in the first 3-4 days after surgery call either your surgeon or your primary doctor.     If you experience loose stools or diarrhea, hold the medications until you stool forms back up.  If your symptoms do not get better within 1 week or if they get worse, check with your doctor.  If you experience "the worst abdominal pain ever" or develop nausea or vomiting, please contact the office immediately for further recommendations for treatment.   ITCHING:  If you experience itching with your medications, try taking only a single pain pill, or even half a pain pill at a time.  You can also use Benadryl over the counter for itching or also to help with sleep.   TED HOSE STOCKINGS:  Use stockings on both legs until for at least 2 weeks or as directed by physician office. They may be removed at night for sleeping.  MEDICATIONS:  See your medication summary on the "After Visit Summary" that nursing will review with you.  You may have some home medications which will be placed on hold until you complete the course of blood thinner medication.  It is important for you to complete the blood thinner medication as prescribed.   Blood clot prevention (DVT Prophylaxis): After surgery you are at an increased risk for a blood clot. you were prescribed a blood thinner, Aspirin 81mg , to be taken twice  daily for a total of 4 weeks from surgery to help reduce your risk of getting a blood clot. This will help prevent a blood clot. Signs of a pulmonary embolus (blood clot in the lungs) include sudden short of breath, feeling lightheaded or dizzy, chest pain with a deep breath, rapid pulse rapid breathing. Signs of a blood clot in your arms or legs include new unexplained swelling and cramping, warm, red or darkened skin around the painful area. Please call the office or 911 right away if these signs or symptoms develop.  PRECAUTIONS:  If you experience chest pain or shortness of breath - call 911 immediately for transfer to the hospital emergency department.   If you develop a fever greater that 101 F,  purulent drainage from wound, increased redness or drainage from wound, foul odor from the wound/dressing, or calf pain - CONTACT YOUR SURGEON.                                                   FOLLOW-UP APPOINTMENTS:  If you do not already have a post-op appointment, please call the office for an appointment to be seen by your surgeon.  Guidelines for how soon to be seen are listed in your "After Visit Summary", but are typically between 2-3 weeks after surgery.  OTHER INSTRUCTIONS:   Knee Replacement:  Do not place pillow under knee, focus on keeping the knee straight while resting. DO NOT modify, tear, cut, or change the foam block in any way.  POST-OPERATIVE OPIOID TAPER INSTRUCTIONS: It is important to wean off of your opioid medication as soon as possible. If you do not need pain medication after your surgery it is ok to stop day one. Opioids include: Codeine, Hydrocodone(Norco, Vicodin), Oxycodone(Percocet, oxycontin) and hydromorphone amongst others.  Long term and even short term use of opiods can cause: Increased pain response Dependence Constipation Depression Respiratory depression And more.  Withdrawal symptoms can include Flu like symptoms Nausea, vomiting And more Techniques to manage these symptoms Hydrate well Eat regular healthy meals Stay active Use relaxation techniques(deep breathing, meditating, yoga) Do Not substitute Alcohol to help with tapering If you have been on opioids for less than two weeks and do not have pain than it is ok to stop all together.  Plan to wean off of opioids This plan should start within one week post op of your joint replacement. Maintain the same interval or time between taking each dose and first decrease the dose.  Cut the total daily intake of opioids by one tablet each day Next start to increase the time between doses. The last dose that should be eliminated is the evening dose.   MAKE SURE YOU:  Understand these  instructions.  Get help right away if you are not doing well or get worse.    Thank you for letting us be a part of your medical care team.  It is a privilege we respect greatly.  We hope these instructions will help you stay on track for a fast and full recovery!           Signed: Inetha Maret A Francine Hannan 01/07/2023, 6:55 AM

## 2023-01-07 NOTE — Anesthesia Postprocedure Evaluation (Signed)
Anesthesia Post Note  Patient: Kimberly Bowers  Procedure(s) Performed: TOTAL KNEE ARTHROPLASTY (Left: Knee)     Patient location during evaluation: PACU Anesthesia Type: Regional and Spinal Level of consciousness: oriented and awake and alert Pain management: pain level controlled Vital Signs Assessment: post-procedure vital signs reviewed and stable Respiratory status: spontaneous breathing, respiratory function stable and patient connected to nasal cannula oxygen Cardiovascular status: blood pressure returned to baseline and stable Postop Assessment: no headache, no backache and no apparent nausea or vomiting Anesthetic complications: no  No notable events documented.  Last Vitals:  Vitals:   01/07/23 0157 01/07/23 0513  BP: 129/85 132/87  Pulse: 81 76  Resp: 16 15  Temp: 36.8 C 36.6 C  SpO2: 96% 96%    Last Pain:  Vitals:   01/07/23 0642  TempSrc:   PainSc: 1                  Haely Leyland L Crystel Demarco

## 2023-01-07 NOTE — Progress Notes (Signed)
     Subjective: Patient reports pain as mild.  Difficulty sleeping yesterday until she got the oxycodone.  Has not had work with physical therapy.  Eager to work with physical therapy today.  Nervous about stair training.  Denies distal numbness and tingling.  No acute concerns.  Objective:   VITALS:   Vitals:   01/06/23 1640 01/06/23 2223 01/07/23 0157 01/07/23 0513  BP: (!) 161/100 124/84 129/85 132/87  Pulse: 88 93 81 76  Resp: 17 15 16 15   Temp: 97.8 F (36.6 C) 98.1 F (36.7 C) 98.3 F (36.8 C) 97.8 F (36.6 C)  TempSrc: Oral     SpO2: 97% 95% 96% 96%  Weight:      Height:        Sensation intact distally Intact pulses distally Dorsiflexion/Plantar flexion intact Incision: dressing C/D/I Compartment soft    Lab Results  Component Value Date   WBC 9.1 01/07/2023   HGB 11.9 (L) 01/07/2023   HCT 36.0 01/07/2023   MCV 102.9 (H) 01/07/2023   PLT 224 01/07/2023   BMET    Component Value Date/Time   NA 137 01/07/2023 0347   K 4.2 01/07/2023 0347   CL 105 01/07/2023 0347   CO2 25 01/07/2023 0347   GLUCOSE 144 (H) 01/07/2023 0347   BUN 21 01/07/2023 0347   CREATININE 1.13 (H) 01/07/2023 0347   CREATININE 0.99 10/21/2022 1222   CALCIUM 8.8 (L) 01/07/2023 0347   EGFR 60 10/21/2022 1222   GFRNONAA 51 (L) 01/07/2023 0347   GFRNONAA 63 07/12/2020 1019   Xray: PACU x-rays show total arthroplasty components in good position no adverse features  Assessment/Plan: 1 Day Post-Op   Principal Problem:   Localized osteoarthritis of left knee  Status post left total knee arthroplasty 01/06/2023  Post op recs: WB: WBAT Abx: ancef Imaging: PACU xrays DVT prophylaxis: Aspirin 81mg  BID x4 weeks Follow up: 2 weeks after surgery for a wound check with Dr. Blanchie Dessert at [facility].  Address: [address], [place], [place] [address]  Office Phone: [phone]    Joen Laura 01/07/2023, 6:52 AM   Weber Cooks, MD  Contact  information:   [phone] 7am-5pm epic message Dr. Blanchie Dessert, or call office for patient follow up: [phone] After hours and holidays please check [email or web address] for group call information for [facility]

## 2023-01-07 NOTE — Care Management Obs Status (Signed)
MEDICARE OBSERVATION STATUS NOTIFICATION   Patient Details  Name: Kimberly Bowers MRN: [id number] Date of Birth: [birth year]   Medicare Observation Status Notification Given:  Yes    Ewing Schlein, LCSW 01/07/2023, 1:49 PM

## 2023-01-07 NOTE — Progress Notes (Signed)
Reviewed written d/c orders with pt and all questions answered. Pt verbalized understanding. Pt left in stable condition with all belongings.

## 2023-01-08 DIAGNOSIS — Z96652 Presence of left artificial knee joint: Secondary | ICD-10-CM | POA: Diagnosis not present

## 2023-01-08 DIAGNOSIS — I129 Hypertensive chronic kidney disease with stage 1 through stage 4 chronic kidney disease, or unspecified chronic kidney disease: Secondary | ICD-10-CM | POA: Diagnosis not present

## 2023-01-08 DIAGNOSIS — E039 Hypothyroidism, unspecified: Secondary | ICD-10-CM | POA: Diagnosis not present

## 2023-01-08 DIAGNOSIS — Z471 Aftercare following joint replacement surgery: Secondary | ICD-10-CM | POA: Diagnosis not present

## 2023-01-08 DIAGNOSIS — K5792 Diverticulitis of intestine, part unspecified, without perforation or abscess without bleeding: Secondary | ICD-10-CM | POA: Diagnosis not present

## 2023-01-08 DIAGNOSIS — N183 Chronic kidney disease, stage 3 unspecified: Secondary | ICD-10-CM | POA: Diagnosis not present

## 2023-01-08 DIAGNOSIS — H269 Unspecified cataract: Secondary | ICD-10-CM | POA: Diagnosis not present

## 2023-01-08 DIAGNOSIS — D709 Neutropenia, unspecified: Secondary | ICD-10-CM | POA: Diagnosis not present

## 2023-01-08 DIAGNOSIS — I712 Thoracic aortic aneurysm, without rupture, unspecified: Secondary | ICD-10-CM | POA: Diagnosis not present

## 2023-01-10 DIAGNOSIS — N183 Chronic kidney disease, stage 3 unspecified: Secondary | ICD-10-CM | POA: Diagnosis not present

## 2023-01-10 DIAGNOSIS — Z96652 Presence of left artificial knee joint: Secondary | ICD-10-CM | POA: Diagnosis not present

## 2023-01-10 DIAGNOSIS — E039 Hypothyroidism, unspecified: Secondary | ICD-10-CM | POA: Diagnosis not present

## 2023-01-10 DIAGNOSIS — I129 Hypertensive chronic kidney disease with stage 1 through stage 4 chronic kidney disease, or unspecified chronic kidney disease: Secondary | ICD-10-CM | POA: Diagnosis not present

## 2023-01-10 DIAGNOSIS — H269 Unspecified cataract: Secondary | ICD-10-CM | POA: Diagnosis not present

## 2023-01-10 DIAGNOSIS — I712 Thoracic aortic aneurysm, without rupture, unspecified: Secondary | ICD-10-CM | POA: Diagnosis not present

## 2023-01-10 DIAGNOSIS — Z471 Aftercare following joint replacement surgery: Secondary | ICD-10-CM | POA: Diagnosis not present

## 2023-01-10 DIAGNOSIS — K5792 Diverticulitis of intestine, part unspecified, without perforation or abscess without bleeding: Secondary | ICD-10-CM | POA: Diagnosis not present

## 2023-01-10 DIAGNOSIS — D709 Neutropenia, unspecified: Secondary | ICD-10-CM | POA: Diagnosis not present

## 2023-01-13 ENCOUNTER — Other Ambulatory Visit (HOSPITAL_COMMUNITY): Payer: Self-pay

## 2023-01-13 DIAGNOSIS — E039 Hypothyroidism, unspecified: Secondary | ICD-10-CM | POA: Diagnosis not present

## 2023-01-13 DIAGNOSIS — Z471 Aftercare following joint replacement surgery: Secondary | ICD-10-CM | POA: Diagnosis not present

## 2023-01-13 DIAGNOSIS — Z96652 Presence of left artificial knee joint: Secondary | ICD-10-CM | POA: Diagnosis not present

## 2023-01-13 DIAGNOSIS — I712 Thoracic aortic aneurysm, without rupture, unspecified: Secondary | ICD-10-CM | POA: Diagnosis not present

## 2023-01-13 DIAGNOSIS — I129 Hypertensive chronic kidney disease with stage 1 through stage 4 chronic kidney disease, or unspecified chronic kidney disease: Secondary | ICD-10-CM | POA: Diagnosis not present

## 2023-01-13 DIAGNOSIS — D709 Neutropenia, unspecified: Secondary | ICD-10-CM | POA: Diagnosis not present

## 2023-01-13 DIAGNOSIS — H269 Unspecified cataract: Secondary | ICD-10-CM | POA: Diagnosis not present

## 2023-01-13 DIAGNOSIS — N183 Chronic kidney disease, stage 3 unspecified: Secondary | ICD-10-CM | POA: Diagnosis not present

## 2023-01-13 DIAGNOSIS — K5792 Diverticulitis of intestine, part unspecified, without perforation or abscess without bleeding: Secondary | ICD-10-CM | POA: Diagnosis not present

## 2023-01-15 DIAGNOSIS — H269 Unspecified cataract: Secondary | ICD-10-CM | POA: Diagnosis not present

## 2023-01-15 DIAGNOSIS — E039 Hypothyroidism, unspecified: Secondary | ICD-10-CM | POA: Diagnosis not present

## 2023-01-15 DIAGNOSIS — K5792 Diverticulitis of intestine, part unspecified, without perforation or abscess without bleeding: Secondary | ICD-10-CM | POA: Diagnosis not present

## 2023-01-15 DIAGNOSIS — I129 Hypertensive chronic kidney disease with stage 1 through stage 4 chronic kidney disease, or unspecified chronic kidney disease: Secondary | ICD-10-CM | POA: Diagnosis not present

## 2023-01-15 DIAGNOSIS — Z96652 Presence of left artificial knee joint: Secondary | ICD-10-CM | POA: Diagnosis not present

## 2023-01-15 DIAGNOSIS — Z471 Aftercare following joint replacement surgery: Secondary | ICD-10-CM | POA: Diagnosis not present

## 2023-01-15 DIAGNOSIS — N183 Chronic kidney disease, stage 3 unspecified: Secondary | ICD-10-CM | POA: Diagnosis not present

## 2023-01-15 DIAGNOSIS — D709 Neutropenia, unspecified: Secondary | ICD-10-CM | POA: Diagnosis not present

## 2023-01-15 DIAGNOSIS — I712 Thoracic aortic aneurysm, without rupture, unspecified: Secondary | ICD-10-CM | POA: Diagnosis not present

## 2023-01-17 DIAGNOSIS — I712 Thoracic aortic aneurysm, without rupture, unspecified: Secondary | ICD-10-CM | POA: Diagnosis not present

## 2023-01-17 DIAGNOSIS — Z96652 Presence of left artificial knee joint: Secondary | ICD-10-CM | POA: Diagnosis not present

## 2023-01-17 DIAGNOSIS — D709 Neutropenia, unspecified: Secondary | ICD-10-CM | POA: Diagnosis not present

## 2023-01-17 DIAGNOSIS — N183 Chronic kidney disease, stage 3 unspecified: Secondary | ICD-10-CM | POA: Diagnosis not present

## 2023-01-17 DIAGNOSIS — Z471 Aftercare following joint replacement surgery: Secondary | ICD-10-CM | POA: Diagnosis not present

## 2023-01-17 DIAGNOSIS — I129 Hypertensive chronic kidney disease with stage 1 through stage 4 chronic kidney disease, or unspecified chronic kidney disease: Secondary | ICD-10-CM | POA: Diagnosis not present

## 2023-01-17 DIAGNOSIS — K5792 Diverticulitis of intestine, part unspecified, without perforation or abscess without bleeding: Secondary | ICD-10-CM | POA: Diagnosis not present

## 2023-01-17 DIAGNOSIS — H269 Unspecified cataract: Secondary | ICD-10-CM | POA: Diagnosis not present

## 2023-01-17 DIAGNOSIS — E039 Hypothyroidism, unspecified: Secondary | ICD-10-CM | POA: Diagnosis not present

## 2023-01-20 DIAGNOSIS — I712 Thoracic aortic aneurysm, without rupture, unspecified: Secondary | ICD-10-CM | POA: Diagnosis not present

## 2023-01-20 DIAGNOSIS — Z471 Aftercare following joint replacement surgery: Secondary | ICD-10-CM | POA: Diagnosis not present

## 2023-01-20 DIAGNOSIS — D709 Neutropenia, unspecified: Secondary | ICD-10-CM | POA: Diagnosis not present

## 2023-01-20 DIAGNOSIS — Z96652 Presence of left artificial knee joint: Secondary | ICD-10-CM | POA: Diagnosis not present

## 2023-01-20 DIAGNOSIS — I129 Hypertensive chronic kidney disease with stage 1 through stage 4 chronic kidney disease, or unspecified chronic kidney disease: Secondary | ICD-10-CM | POA: Diagnosis not present

## 2023-01-20 DIAGNOSIS — E039 Hypothyroidism, unspecified: Secondary | ICD-10-CM | POA: Diagnosis not present

## 2023-01-20 DIAGNOSIS — H269 Unspecified cataract: Secondary | ICD-10-CM | POA: Diagnosis not present

## 2023-01-20 DIAGNOSIS — N183 Chronic kidney disease, stage 3 unspecified: Secondary | ICD-10-CM | POA: Diagnosis not present

## 2023-01-20 DIAGNOSIS — K5792 Diverticulitis of intestine, part unspecified, without perforation or abscess without bleeding: Secondary | ICD-10-CM | POA: Diagnosis not present

## 2023-01-21 DIAGNOSIS — M1712 Unilateral primary osteoarthritis, left knee: Secondary | ICD-10-CM | POA: Diagnosis not present

## 2023-01-22 DIAGNOSIS — M25562 Pain in left knee: Secondary | ICD-10-CM | POA: Diagnosis not present

## 2023-01-30 DIAGNOSIS — M25562 Pain in left knee: Secondary | ICD-10-CM | POA: Diagnosis not present

## 2023-02-04 DIAGNOSIS — M25562 Pain in left knee: Secondary | ICD-10-CM | POA: Diagnosis not present

## 2023-02-07 ENCOUNTER — Other Ambulatory Visit (HOSPITAL_COMMUNITY): Payer: Self-pay

## 2023-02-11 DIAGNOSIS — M25562 Pain in left knee: Secondary | ICD-10-CM | POA: Diagnosis not present

## 2023-02-12 DIAGNOSIS — D2230 Melanocytic nevi of unspecified part of face: Secondary | ICD-10-CM | POA: Diagnosis not present

## 2023-02-12 DIAGNOSIS — Z85828 Personal history of other malignant neoplasm of skin: Secondary | ICD-10-CM | POA: Diagnosis not present

## 2023-02-12 DIAGNOSIS — L578 Other skin changes due to chronic exposure to nonionizing radiation: Secondary | ICD-10-CM | POA: Diagnosis not present

## 2023-02-12 DIAGNOSIS — D2272 Melanocytic nevi of left lower limb, including hip: Secondary | ICD-10-CM | POA: Diagnosis not present

## 2023-02-12 DIAGNOSIS — D225 Melanocytic nevi of trunk: Secondary | ICD-10-CM | POA: Diagnosis not present

## 2023-02-12 DIAGNOSIS — Z411 Encounter for cosmetic surgery: Secondary | ICD-10-CM | POA: Diagnosis not present

## 2023-02-12 DIAGNOSIS — L821 Other seborrheic keratosis: Secondary | ICD-10-CM | POA: Diagnosis not present

## 2023-02-12 DIAGNOSIS — L570 Actinic keratosis: Secondary | ICD-10-CM | POA: Diagnosis not present

## 2023-02-13 ENCOUNTER — Other Ambulatory Visit: Payer: Self-pay | Admitting: Nurse Practitioner

## 2023-02-13 DIAGNOSIS — E039 Hypothyroidism, unspecified: Secondary | ICD-10-CM

## 2023-02-18 DIAGNOSIS — M1712 Unilateral primary osteoarthritis, left knee: Secondary | ICD-10-CM | POA: Diagnosis not present

## 2023-02-18 DIAGNOSIS — M25562 Pain in left knee: Secondary | ICD-10-CM | POA: Diagnosis not present

## 2023-02-25 DIAGNOSIS — M25562 Pain in left knee: Secondary | ICD-10-CM | POA: Diagnosis not present

## 2023-03-10 ENCOUNTER — Other Ambulatory Visit (HOSPITAL_COMMUNITY): Payer: Self-pay

## 2023-03-10 ENCOUNTER — Other Ambulatory Visit: Payer: Self-pay | Admitting: Nurse Practitioner

## 2023-03-10 MED ORDER — WEGOVY 2.4 MG/0.75ML ~~LOC~~ SOAJ
2.4000 mg | SUBCUTANEOUS | 2 refills | Status: AC
  Filled 2023-03-10: qty 3, 28d supply, fill #0
  Filled 2023-04-01: qty 3, 28d supply, fill #1
  Filled 2023-05-01: qty 3, 28d supply, fill #2

## 2023-03-11 DIAGNOSIS — M25562 Pain in left knee: Secondary | ICD-10-CM | POA: Diagnosis not present

## 2023-03-18 DIAGNOSIS — M25562 Pain in left knee: Secondary | ICD-10-CM | POA: Diagnosis not present

## 2023-03-20 DIAGNOSIS — M25562 Pain in left knee: Secondary | ICD-10-CM | POA: Diagnosis not present

## 2023-03-21 ENCOUNTER — Ambulatory Visit (AMBULATORY_SURGERY_CENTER): Payer: Medicare PPO | Admitting: *Deleted

## 2023-03-21 VITALS — Ht 65.5 in | Wt 175.0 lb

## 2023-03-21 DIAGNOSIS — Z860100 Personal history of colon polyps, unspecified: Secondary | ICD-10-CM

## 2023-03-21 MED ORDER — NA SULFATE-K SULFATE-MG SULF 17.5-3.13-1.6 GM/177ML PO SOLN: 1.0000 | Freq: Once | ORAL | 0 refills | Status: AC

## 2023-03-21 NOTE — Progress Notes (Signed)
 Pt's name and DOB verified at the beginning of the pre-visit wit 2 identifiers  Pt denies any difficulty with ambulating,sitting, laying down or rolling side to side  Pt has no issues with ambulation   Pt has no issues moving head neck or swallowing  No egg or soy allergy known to patient   No issues known to pt with past sedation with any surgeries or procedures  Pt denies having issues being intubated  No FH of Malignant Hyperthermia  Pt is not on diet pills or shots  Pt is not on home 02   Pt is not on blood thinners   Pt has frequent issues with constipation RN instructed pt to use Miralax  per bottles instructions a week before prep days. Pt states they will  Pt is not on dialysis  Pt denise any abnormal heart rhythms   Pt denies any upcoming cardiac testing  Pt encouraged to use to use Singlecare or Goodrx to reduce cost   Patient's chart reviewed by Norleen Schillings CNRA prior to pre-visit and patient appropriate for the [facility].  Pre-visit completed and red dot placed by patient's name on their procedure day (on provider's schedule).  .  Visit by phone  Pt states weight is 175 lb  Instructed pt why it is important to and  to call if they have any changes in health or new medications. Directed them to the # given and on instructions.     Instructions reviewed. Pt given both [facility] main # and MD on call # prior to instructions.  Pt states understanding. Instructed to review again prior to procedure. Pt states they will.   Instructions sent by mail  by My Chart  Coupon sent via text to mobile phone and pt verified they received it

## 2023-03-25 DIAGNOSIS — M25562 Pain in left knee: Secondary | ICD-10-CM | POA: Diagnosis not present

## 2023-03-27 DIAGNOSIS — Z1231 Encounter for screening mammogram for malignant neoplasm of breast: Secondary | ICD-10-CM | POA: Diagnosis not present

## 2023-03-28 DIAGNOSIS — M25562 Pain in left knee: Secondary | ICD-10-CM | POA: Diagnosis not present

## 2023-04-01 ENCOUNTER — Other Ambulatory Visit: Payer: Self-pay

## 2023-04-01 DIAGNOSIS — M25562 Pain in left knee: Secondary | ICD-10-CM | POA: Diagnosis not present

## 2023-04-03 DIAGNOSIS — M25562 Pain in left knee: Secondary | ICD-10-CM | POA: Diagnosis not present

## 2023-04-08 ENCOUNTER — Other Ambulatory Visit (HOSPITAL_COMMUNITY): Payer: Self-pay

## 2023-04-08 ENCOUNTER — Ambulatory Visit: Payer: Medicare PPO | Admitting: Nurse Practitioner

## 2023-04-10 DIAGNOSIS — M25562 Pain in left knee: Secondary | ICD-10-CM | POA: Diagnosis not present

## 2023-04-17 ENCOUNTER — Ambulatory Visit: Payer: Medicare PPO | Admitting: Nurse Practitioner

## 2023-04-21 ENCOUNTER — Encounter: Payer: Self-pay | Admitting: Gastroenterology

## 2023-04-21 ENCOUNTER — Ambulatory Visit (AMBULATORY_SURGERY_CENTER): Payer: Medicare PPO | Admitting: Gastroenterology

## 2023-04-21 VITALS — BP 118/76 | HR 76 | Temp 97.5°F | Resp 20 | Ht 65.5 in | Wt 175.0 lb

## 2023-04-21 DIAGNOSIS — Z1211 Encounter for screening for malignant neoplasm of colon: Secondary | ICD-10-CM | POA: Diagnosis not present

## 2023-04-21 DIAGNOSIS — Z860101 Personal history of adenomatous and serrated colon polyps: Secondary | ICD-10-CM

## 2023-04-21 DIAGNOSIS — K6389 Other specified diseases of intestine: Secondary | ICD-10-CM

## 2023-04-21 DIAGNOSIS — K644 Residual hemorrhoidal skin tags: Secondary | ICD-10-CM

## 2023-04-21 DIAGNOSIS — D122 Benign neoplasm of ascending colon: Secondary | ICD-10-CM

## 2023-04-21 DIAGNOSIS — K648 Other hemorrhoids: Secondary | ICD-10-CM

## 2023-04-21 DIAGNOSIS — Z860100 Personal history of colon polyps, unspecified: Secondary | ICD-10-CM

## 2023-04-21 MED ORDER — SODIUM CHLORIDE 0.9 % IV SOLN: 500.0000 mL | Freq: Once | INTRAVENOUS | Status: DC

## 2023-04-21 NOTE — Progress Notes (Signed)
 [facility] History and Physical   Primary Care Physician:  Wilkinson, Dana E, NP   Reason for Procedure:  History of adenomatous colon polyps  Plan:    Surveillance colonoscopy with possible interventions as needed     HPI: Kimberly Bowers is a very pleasant 73 y.o. female here for surveillance colonoscopy. Denies any nausea, vomiting, abdominal pain, melena or bright red blood per rectum  The risks and benefits as well as alternatives of endoscopic procedure(s) have been discussed and reviewed. All questions answered. The patient agrees to proceed.    Past Medical History:  Diagnosis Date   Allergy    Aortic aneurysm, thoracic (HCC)    Mild   Arthritis    Cancer (HCC)    Skin cancer: leg   Cataract    CKD (chronic kidney disease) stage 3, GFR 30-59 ml/min (HCC) 01/11/2020   stage 2: 12/24/22   Diverticulitis 2004   HTN (hypertension) 12/13/2022   B/P severe,    B/,P 160s,  DB/P running >110   Hyperlipidemia    Hypothyroid    Labile hypertension 02/07/2021   Neutropenia (HCC)    PONV (postoperative nausea and vomiting)     Past Surgical History:  Procedure Laterality Date   BRACHIOPLASTY  03/2016   BREAST BIOPSY Left 01/2018   Negative benign   CATARACT EXTRACTION Bilateral 2019   COLONOSCOPY     FOOT SURGERY Right    benign cyst   KNEE ARTHROSCOPY Left    LAPAROSCOPIC GASTRIC BANDING  2004   LAPAROSCOPIC REPAIR AND REMOVAL OF GASTRIC BAND     8/14   TOTAL KNEE ARTHROPLASTY Left 01/06/2023   Procedure: TOTAL KNEE ARTHROPLASTY;  Surgeon: Murleen Arms, MD;  Location: [facility];  Service: Orthopedics;  Laterality: Left;   TUBAL LIGATION     age 56   UTERINE FIBROID SURGERY  2001    Prior to Admission medications   Medication Sig Start Date End Date Taking? Authorizing Provider  amoxicillin (AMOXIL) 500 MG capsule SMARTSIG:4 Capsule(s) By Mouth 04/16/23  Yes [provider]  B Complex Vitamins (B COMPLEX PO) Take 1 tablet by mouth  daily.   Yes [provider]  Cholecalciferol  (VITAMIN D ) 2000 UNITS tablet Take 2,000 Units by mouth daily.   Yes [provider]  levothyroxine  (SYNTHROID ) 125 MCG tablet TAKE ONE TABLET BY MOUTH ONCE A DAY ON EMPTY STOMACH WITH ONLY WATER  FOR 30 MINUTES. NO ANTACIDS, CALCIUM , MAGNESIUM FOR 4 HOURS AND AVOID BIOTIN 02/13/23  Yes Wilkinson, Dana E, NP  losartan  (COZAAR ) 25 MG tablet Take 1 tablet (25 mg total) by mouth daily. 12/13/22 12/13/23 Yes Wilkinson, Dana E, NP  MAGNESIUM PO Take 150 mg by mouth daily.   Yes [provider]  Omega-3 Fatty Acids (OMEGA 3 500 PO) Take 500 mg by mouth daily.   Yes [provider]  OVER THE COUNTER MEDICATION Take 1 tablet by mouth daily. Intestinal formula #1 supplement   Yes [provider]  rosuvastatin  (CRESTOR ) 5 MG tablet Take 1 tab daily to reduce risk of heart attack and stroke. 08/06/22  Yes Cranford, Tonya, NP  Wheat Dextrin (BENEFIBER) POWD Take 1 Dose by mouth daily. 1 dose = 1 tablespoon   Yes [provider]  Semaglutide -Weight Management (WEGOVY ) 2.4 MG/0.75ML SOAJ Inject 2.4 mg into the skin once a week. 03/10/23   Wilkinson, Dana E, NP    Current Outpatient Medications  Medication Sig Dispense Refill   amoxicillin (AMOXIL) 500 MG capsule SMARTSIG:4  Capsule(s) By Mouth     B Complex Vitamins (B COMPLEX PO) Take 1 tablet by mouth daily.     Cholecalciferol  (VITAMIN D ) 2000 UNITS tablet Take 2,000 Units by mouth daily.     levothyroxine  (SYNTHROID ) 125 MCG tablet TAKE ONE TABLET BY MOUTH ONCE A DAY ON EMPTY STOMACH WITH ONLY WATER  FOR 30 MINUTES. NO ANTACIDS, CALCIUM , MAGNESIUM FOR 4 HOURS AND AVOID BIOTIN 90 tablet 0   losartan  (COZAAR ) 25 MG tablet Take 1 tablet (25 mg total) by mouth daily. 30 tablet 11   MAGNESIUM PO Take 150 mg by mouth daily.     Omega-3 Fatty Acids (OMEGA 3 500 PO) Take 500 mg by mouth daily.     OVER THE COUNTER MEDICATION Take 1 tablet by mouth daily. Intestinal  formula #1 supplement     rosuvastatin  (CRESTOR ) 5 MG tablet Take 1 tab daily to reduce risk of heart attack and stroke. 90 tablet 3   Wheat Dextrin (BENEFIBER) POWD Take 1 Dose by mouth daily. 1 dose = 1 tablespoon     Semaglutide -Weight Management (WEGOVY ) 2.4 MG/0.75ML SOAJ Inject 2.4 mg into the skin once a week. 3 mL 2   Current Facility-Administered Medications  Medication Dose Route Frequency Provider Last Rate Last Admin   0.9 %  sodium chloride  infusion  500 mL Intravenous Once Melenda Bielak V, MD        Allergies as of 04/21/2023 - Review Complete 04/21/2023  Allergen Reaction Noted   Quinolones Other (See Comments) 07/24/2021    Family History  Problem Relation Age of Onset   Cancer Mother        lung   Diabetes Father    Emphysema Father    Melanoma Brother    Colon cancer Neg Hx    Colon polyps Neg Hx    Esophageal cancer Neg Hx    Stomach cancer Neg Hx    Rectal cancer Neg Hx     Social History   Socioeconomic History   Marital status: Married    Spouse name: Not on file   Number of children: Not on file   Years of education: Not on file   Highest education level: Not on file  Occupational History   Not on file  Tobacco Use   Smoking status: Never   Smokeless tobacco: Never  Vaping Use   Vaping status: Never Used  Substance and Sexual Activity   Alcohol  use: Yes    Alcohol /week: 1.0 - 2.0 standard drink of alcohol     Types: 1 - 2 Glasses of wine per week    Comment: soc.   Drug use: No   Sexual activity: Yes    Partners: Male    Birth control/protection: Surgical, Post-menopausal    Comment: BTL  Other Topics Concern   Not on file  Social History Narrative   Not on file   Social Drivers of Health   Financial Resource Strain: Not on file  Food Insecurity: No Food Insecurity (01/06/2023)   Hunger Vital Sign    Worried About Running Out of Food in the Last Year: Never true    Ran Out of Food in the Last Year: Never true  Transportation  Needs: No Transportation Needs (01/06/2023)   PRAPARE - Administrator, Civil Service (Medical): No    Lack of Transportation (Non-Medical): No  Physical Activity: Not on file  Stress: Not on file  Social Connections: Not on file  Intimate Partner Violence: Not At Risk (01/06/2023)  Humiliation, Afraid, Rape, and Kick questionnaire    Fear of Current or Ex-Partner: No    Emotionally Abused: No    Physically Abused: No    Sexually Abused: No    Review of Systems:  All other review of systems negative except as mentioned in the HPI.  Physical Exam: Vital signs in last 24 hours: BP (!) 122/92   Pulse 99   Temp (!) 97.5 F (36.4 C) (Temporal)   Ht 5' 5.5" (1.664 m)   Wt 175 lb (79.4 kg)   LMP 03/11/2000   SpO2 98%   BMI 28.68 kg/m  General:   Alert, NAD Lungs:  Clear .   Heart:  Regular rate and rhythm Abdomen:  Soft, nontender and nondistended. Neuro/Psych:  Alert and cooperative. Normal mood and affect. A and O x 3  Reviewed labs, radiology imaging, old records and pertinent past GI work up  Patient is appropriate for planned procedure(s) and anesthesia in an ambulatory setting   K. Veena Vincent Streater , MD [phone]

## 2023-04-21 NOTE — Patient Instructions (Signed)
 Resume previous diet and medications. Awaiting pathology results. Repeat Colonoscopy date to be determined based on pathology results. Handouts provided on colon polyps  YOU HAD AN ENDOSCOPIC PROCEDURE TODAY AT THE  ENDOSCOPY CENTER:   Refer to the procedure report that was given to you for any specific questions about what was found during the examination.  If the procedure report does not answer your questions, please call your gastroenterologist to clarify.  If you requested that your care partner not be given the details of your procedure findings, then the procedure report has been included in a sealed envelope for you to review at your convenience later.  YOU SHOULD EXPECT: Some feelings of bloating in the abdomen. Passage of more gas than usual.  Walking can help get rid of the air that was put into your GI tract during the procedure and reduce the bloating. If you had a lower endoscopy (such as a colonoscopy or flexible sigmoidoscopy) you may notice spotting of blood in your stool or on the toilet paper. If you underwent a bowel prep for your procedure, you may not have a normal bowel movement for a few days.  Please Note:  You might notice some irritation and congestion in your nose or some drainage.  This is from the oxygen used during your procedure.  There is no need for concern and it should clear up in a day or so.  SYMPTOMS TO REPORT IMMEDIATELY:  Following lower endoscopy (colonoscopy or flexible sigmoidoscopy):  Excessive amounts of blood in the stool  Significant tenderness or worsening of abdominal pains  Swelling of the abdomen that is new, acute  Fever of 100F or higher  For urgent or emergent issues, a gastroenterologist can be reached at any hour by calling [phone]. Do not use MyChart messaging for urgent concerns.    DIET:  We do recommend a small meal at first, but then you may proceed to your regular diet.  Drink plenty of fluids but you should avoid  alcoholic beverages for 24 hours.  ACTIVITY:  You should plan to take it easy for the rest of today and you should NOT DRIVE or use heavy machinery until tomorrow (because of the sedation medicines used during the test).    FOLLOW UP: Our staff will call the number listed on your records the next business day following your procedure.  We will call around 7:15- 8:00 am to check on you and address any questions or concerns that you may have regarding the information given to you following your procedure. If we do not reach you, we will leave a message.     If any biopsies were taken you will be contacted by phone or by letter within the next 1-3 weeks.  Please call us at [phone] if you have not heard about the biopsies in 3 weeks.    SIGNATURES/CONFIDENTIALITY: You and/or your care partner have signed paperwork which will be entered into your electronic medical record.  These signatures attest to the fact that that the information above on your After Visit Summary has been reviewed and is understood.  Full responsibility of the confidentiality of this discharge information lies with you and/or your care-partner.

## 2023-04-21 NOTE — Op Note (Signed)
 [facility] Patient Name: Kimberly Bowers Procedure Date: 04/21/2023 9:08 AM MRN: [id number] Endoscopist: Sergio Dandy , MD, [id number] Age: 73 Referring MD:  Date of Birth: 02/11/1951 Gender: Female Account #: [id number] Procedure:                Colonoscopy Indications:              High risk colon cancer surveillance: Personal                            history of adenoma less than 10 mm in size Medicines:                Monitored Anesthesia Care Procedure:                Pre-Anesthesia Assessment:                           - Prior to the procedure, a History and Physical                            was performed, and patient medications and                            allergies were reviewed. The patient's tolerance of                            previous anesthesia was also reviewed. The risks                            and benefits of the procedure and the sedation                            options and risks were discussed with the patient.                            All questions were answered, and informed consent                            was obtained. Prior Anticoagulants: The patient has                            taken no anticoagulant or antiplatelet agents. ASA                            Grade Assessment: II - A patient with mild systemic                            disease. After reviewing the risks and benefits,                            the patient was deemed in satisfactory condition to                            undergo the procedure.  After obtaining informed consent, the colonoscope                            was passed under direct vision. Throughout the                            procedure, the patient's blood pressure, pulse, and                            oxygen saturations were monitored continuously. The                            Olympus Scope SN [id number] was introduced through the                            anus and  advanced to the the cecum, identified by                            appendiceal orifice and ileocecal valve. The                            colonoscopy was performed without difficulty. The                            patient tolerated the procedure well. The quality                            of the bowel preparation was good. The ileocecal                            valve, appendiceal orifice, and rectum were                            photographed. Scope In: 9:24:10 AM Scope Out: 9:41:32 AM Scope Withdrawal Time: 0 hours 13 minutes 53 seconds  Total Procedure Duration: 0 hours 17 minutes 22 seconds  Findings:                 The perianal and digital rectal examinations were                            normal.                           A 3 mm polyp was found in the ascending colon. The                            polyp was sessile. The polyp was removed with a                            cold snare. Resection and retrieval were complete.                           A diffuse area of severe melanosis was found in the  entire colon.                           Non-bleeding external and internal hemorrhoids were                            found during retroflexion. The hemorrhoids were                            medium-sized. Complications:            No immediate complications. Estimated Blood Loss:     Estimated blood loss was minimal. Impression:               - One 3 mm polyp in the ascending colon, removed                            with a cold snare. Resected and retrieved.                           - Melanosis in the colon.                           - Non-bleeding external and internal hemorrhoids. Recommendation:           - Patient has a contact number available for                            emergencies. The signs and symptoms of potential                            delayed complications were discussed with the                            patient. Return to  normal activities tomorrow.                            Written discharge instructions were provided to the                            patient.                           - Resume previous diet.                           - Continue present medications.                           - Await pathology results.                           - Repeat colonoscopy in 5-10 years for surveillance                            based on pathology results. Saydie Gerdts V. Abrielle Finck, MD 04/21/2023 9:46:50 AM This report has been signed electronically.

## 2023-04-21 NOTE — Progress Notes (Signed)
 Vss nad trans to pacu

## 2023-04-21 NOTE — Progress Notes (Signed)
 Pt's states no medical or surgical changes since previsit or office visit.

## 2023-04-21 NOTE — Progress Notes (Signed)
 Called to room to assist during endoscopic procedure.  Patient ID and intended procedure confirmed with present staff. Received instructions for my participation in the procedure from the performing physician.

## 2023-04-22 ENCOUNTER — Telehealth: Payer: Self-pay

## 2023-04-22 NOTE — Telephone Encounter (Signed)
  Follow up Call-     04/21/2023    8:26 AM 12/13/2022    8:21 AM  Call back number  Post procedure Call Back phone  # [phone]  Permission to leave phone message Yes Yes     Patient questions:  Do you have a fever, pain , or abdominal swelling? No. Pain Score  0 *  Have you tolerated food without any problems? Yes.    Have you been able to return to your normal activities? Yes.    Do you have any questions about your discharge instructions: Diet   No. Medications  No. Follow up visit  No.  Do you have questions or concerns about your Care? No.  Actions: * If pain score is 4 or above: No action needed, pain <4.

## 2023-04-23 LAB — SURGICAL PATHOLOGY

## 2023-05-01 ENCOUNTER — Other Ambulatory Visit (HOSPITAL_COMMUNITY): Payer: Self-pay

## 2023-05-07 ENCOUNTER — Encounter (HOSPITAL_COMMUNITY): Payer: Self-pay

## 2023-05-07 ENCOUNTER — Other Ambulatory Visit: Payer: Self-pay

## 2023-05-07 ENCOUNTER — Emergency Department (HOSPITAL_COMMUNITY)
Admission: EM | Admit: 2023-05-07 | Discharge: 2023-05-08 | Disposition: A | Discharge status: Home / Self Care | Payer: Medicare PPO | Attending: Emergency Medicine | Admitting: Emergency Medicine

## 2023-05-07 DIAGNOSIS — Z79899 Other long term (current) drug therapy: Secondary | ICD-10-CM | POA: Insufficient documentation

## 2023-05-07 DIAGNOSIS — E039 Hypothyroidism, unspecified: Secondary | ICD-10-CM | POA: Diagnosis not present

## 2023-05-07 DIAGNOSIS — N189 Chronic kidney disease, unspecified: Secondary | ICD-10-CM | POA: Diagnosis not present

## 2023-05-07 DIAGNOSIS — R1110 Vomiting, unspecified: Secondary | ICD-10-CM | POA: Diagnosis present

## 2023-05-07 DIAGNOSIS — J101 Influenza due to other identified influenza virus with other respiratory manifestations: Secondary | ICD-10-CM | POA: Insufficient documentation

## 2023-05-07 DIAGNOSIS — Z79890 Hormone replacement therapy (postmenopausal): Secondary | ICD-10-CM | POA: Insufficient documentation

## 2023-05-07 DIAGNOSIS — I129 Hypertensive chronic kidney disease with stage 1 through stage 4 chronic kidney disease, or unspecified chronic kidney disease: Secondary | ICD-10-CM | POA: Insufficient documentation

## 2023-05-07 LAB — COMPREHENSIVE METABOLIC PANEL
ALT: 19 U/L (ref 0–44)
AST: 25 U/L (ref 15–41)
Albumin: 4.2 g/dL (ref 3.5–5.0)
Alkaline Phosphatase: 60 U/L (ref 38–126)
Anion gap: 12 (ref 5–15)
BUN: 17 mg/dL (ref 8–23)
CO2: 24 mmol/L (ref 22–32)
Calcium: 9.2 mg/dL (ref 8.9–10.3)
Chloride: 98 mmol/L (ref 98–111)
Creatinine, Ser: 0.74 mg/dL (ref 0.44–1.00)
GFR, Estimated: >60 mL/min (ref >60)
Glucose, Bld: 126 mg/dL — ABNORMAL HIGH (ref 70–99)
Potassium: 3.6 mmol/L (ref 3.5–5.1)
Sodium: 134 mmol/L — ABNORMAL LOW (ref 135–145)
Total Bilirubin: 0.6 mg/dL (ref 0.0–1.2)
Total Protein: 7.3 g/dL (ref 6.5–8.1)

## 2023-05-07 LAB — CBC
HCT: 39.8 % (ref 36.0–46.0)
Hemoglobin: 13.7 g/dL (ref 12.0–15.0)
MCH: 33.9 pg (ref 26.0–34.0)
MCHC: 34.4 g/dL (ref 30.0–36.0)
MCV: 98.5 fL (ref 80.0–100.0)
Platelets: 267 10*3/uL (ref 150–400)
RBC: 4.04 MIL/uL (ref 3.87–5.11)
RDW: 12.5 % (ref 11.5–15.5)
WBC: 3.1 10*3/uL — ABNORMAL LOW (ref 4.0–10.5)
nRBC: 0 % (ref 0.0–0.2)

## 2023-05-07 LAB — RESP PANEL BY RT-PCR (RSV, FLU A&B, COVID)  RVPGX2
Influenza A by PCR: POSITIVE — AB
Influenza B by PCR: NEGATIVE
Resp Syncytial Virus by PCR: NEGATIVE
SARS Coronavirus 2 by RT PCR: NEGATIVE

## 2023-05-07 LAB — LIPASE, BLOOD: Lipase: 28 U/L (ref 11–51)

## 2023-05-07 MED ORDER — KETOROLAC TROMETHAMINE 15 MG/ML IJ SOLN
15.0000 mg | Freq: Once | INTRAMUSCULAR | Status: AC
  Administered 2023-05-07: 15 mg via INTRAVENOUS
  Filled 2023-05-07: qty 1

## 2023-05-07 MED ORDER — METOCLOPRAMIDE HCL 5 MG/ML IJ SOLN
10.0000 mg | Freq: Once | INTRAMUSCULAR | Status: AC
  Administered 2023-05-07: 10 mg via INTRAVENOUS
  Filled 2023-05-07: qty 2

## 2023-05-07 MED ORDER — DIPHENHYDRAMINE HCL 25 MG PO CAPS
25.0000 mg | ORAL_CAPSULE | Freq: Once | ORAL | Status: AC
  Administered 2023-05-07: 25 mg via ORAL
  Filled 2023-05-07: qty 1

## 2023-05-07 MED ORDER — ONDANSETRON 4 MG PO TBDP
4.0000 mg | ORAL_TABLET | Freq: Once | ORAL | Status: DC | PRN
Start: 2023-05-07 — End: 2023-05-07

## 2023-05-07 NOTE — ED Provider Notes (Signed)
 [place] [facility] Provider Note   CSN: [id number] Arrival date & time: 05/07/23  1655     History  Chief Complaint  Patient presents with   Emesis   Diarrhea    Kimberly Bowers is a 73 y.o. female.  The history is provided by the patient, the spouse and medical records. No language interpreter was used.  Emesis Associated symptoms: diarrhea   Diarrhea Associated symptoms: vomiting       Kimberly Bowers is a 73 year old female with history of HTN, CKD, Aortic dilation, HLD, and Hypothyroidism who presents today for vomiting and diarrhea. She reports episodes every hour of emesis and diarrhea that started at 0200 today. She took oral zofran and a phenergan suppository at home with no improvement in symptoms. She states these were prescribed to her in 2022 when she had a similar episode of N/V/D with a Covid infection. She has not had any episodes of emesis or diarrhea since arriving at the ER around 1700. She reports being unable to tolerate PO intake since yesterday afternoon but has been able to drink water without complications since arrival to ER. She endorses nausea and increased belching but denies any changes to medications or diet, hematochezia, hematemesis, melena, or abdominal pain. She reports a headache that started with her GI symptoms this morning. She describes it as a pressure and squeezing sensation from her frontal to parietal lobes. She endorses photophobia and lightheadedness but denies vision changes, neck stiffness or pain. She states she does not get headaches often but when she does, tylenol and ibuprofen do not help alleviate symptoms. She reports cough and sore throat yesterday (02/26) morning with a negative at-home Flu and Covid test.  She denies sick contacts, SOB, congestion, body aches, or fever.  Home Medications Prior to Admission medications   Medication Sig Start Date End Date Taking? Authorizing Provider  amoxicillin  (AMOXIL) 500 MG capsule SMARTSIG:4 Capsule(s) By Mouth 04/16/23   [provider]  B Complex Vitamins (B COMPLEX PO) Take 1 tablet by mouth daily.    [provider]  Cholecalciferol (VITAMIN D) 2000 UNITS tablet Take 2,000 Units by mouth daily.    [provider]  levothyroxine (SYNTHROID) 125 MCG tablet TAKE ONE TABLET BY MOUTH ONCE A DAY ON EMPTY STOMACH WITH ONLY WATER FOR 30 MINUTES. NO ANTACIDS, CALCIUM, MAGNESIUM FOR 4 HOURS AND AVOID BIOTIN 02/13/23   Raynelle Dick, NP  losartan (COZAAR) 25 MG tablet Take 1 tablet (25 mg total) by mouth daily. 12/13/22 12/13/23  Raynelle Dick, NP  MAGNESIUM PO Take 150 mg by mouth daily.    [provider]  Omega-3 Fatty Acids (OMEGA 3 500 PO) Take 500 mg by mouth daily.    [provider]  OVER THE COUNTER MEDICATION Take 1 tablet by mouth daily. Intestinal formula #1 supplement    [provider]  rosuvastatin (CRESTOR) 5 MG tablet Take 1 tab daily to reduce risk of heart attack and stroke. 08/06/22   Adela Glimpse, NP  Semaglutide-Weight Management (WEGOVY) 2.4 MG/0.75ML SOAJ Inject 2.4 mg into the skin once a week. 03/10/23   Raynelle Dick, NP  Wheat Dextrin (BENEFIBER) POWD Take 1 Dose by mouth daily. 1 dose = 1 tablespoon    [provider]      Allergies    Quinolones    Review of Systems   Review of Systems  Gastrointestinal:  Positive for diarrhea and vomiting.  All  other systems reviewed and are negative.   Physical Exam Updated Vital Signs BP (!) 146/102 (BP Location: Left Arm)   Pulse (!) 102   Temp 98.9 F (37.2 C) (Oral)   Resp 18   Ht 5\' 6"  (1.676 m)   Wt 79.4 kg   LMP 03/11/2000   SpO2 100%   BMI 28.25 kg/m  Physical Exam Vitals and nursing note reviewed.  Constitutional:      General: She is not in acute distress.    Appearance: She is well-developed.  HENT:     Head: Atraumatic.     Mouth/Throat:     Mouth: Mucous membranes are moist.      Pharynx: No oropharyngeal exudate or posterior oropharyngeal erythema.  Eyes:     Conjunctiva/sclera: Conjunctivae normal.     Pupils: Pupils are equal, round, and reactive to light.  Cardiovascular:     Rate and Rhythm: Normal rate and regular rhythm.     Pulses: Normal pulses.     Heart sounds: Normal heart sounds.  Pulmonary:     Effort: Pulmonary effort is normal.     Breath sounds: No wheezing, rhonchi or rales.  Abdominal:     Palpations: Abdomen is soft.     Tenderness: There is no abdominal tenderness.  Musculoskeletal:        General: Normal range of motion.     Cervical back: Normal range of motion and neck supple. No rigidity.  Skin:    Findings: No rash.  Neurological:     Mental Status: She is alert and oriented to person, place, and time. Mental status is at baseline.  Psychiatric:        Mood and Affect: Mood normal.     ED Results / Procedures / Treatments   Labs (all labs ordered are listed, but only abnormal results are displayed) Labs Reviewed  RESP PANEL BY RT-PCR (RSV, FLU A&B, COVID)  RVPGX2 - Abnormal; Notable for the following components:      Result Value   Influenza A by PCR POSITIVE (*)    All other components within normal limits  COMPREHENSIVE METABOLIC PANEL - Abnormal; Notable for the following components:   Sodium 134 (*)    Glucose, Bld 126 (*)    All other components within normal limits  CBC - Abnormal; Notable for the following components:   WBC 3.1 (*)    All other components within normal limits  LIPASE, BLOOD  URINALYSIS, ROUTINE W REFLEX MICROSCOPIC    EKG None  Radiology No results found.  Procedures Procedures    Medications Ordered in ED Medications  metoCLOPramide (REGLAN) injection 10 mg (10 mg Intravenous Given 05/07/23 2223)  diphenhydrAMINE (BENADRYL) capsule 25 mg (25 mg Oral Given 05/07/23 2222)  ketorolac (TORADOL) 15 MG/ML injection 15 mg (15 mg Intravenous Given 05/07/23 2219)    ED Course/ Medical  Decision Making/ A&P                                 Medical Decision Making Amount and/or Complexity of Data Reviewed Labs: ordered.   BP (!) 146/102 (BP Location: Left Arm)   Pulse (!) 102   Temp 98.9 F (37.2 C) (Oral)   Resp 18   Ht 5\' 6"  (1.676 m)   Wt 79.4 kg   LMP 03/11/2000   SpO2 100%   BMI 28.25 kg/m   80:58 PM 73 year old female hx of CKD,  anxiety, obesity, HTN here with cold sxs.  Pt developed throat irritation last night but throughout the day today she has had persistent n/v/d not relieved with PO zofran and phenergan suppository.  She mention her nausea vomiting diarrhea seems to be improving and she has not had any vomiting for the past 5 hours.  She still endorsed feeling bit nauseous.  She endorsed having some throat irritation that has since improved.  Does complain of throbbing headache since yesterday and chills.  No urinary symptoms.  She did take a home COVID and flu test yesterday morning which came back negative.  She denies any recent sick contact.  Exam overall reassuring, this is a well-appearing female resting comfortably appears to be in no acute discomfort.  Heart with normal rate and rhythm, lungs clear to auscultation bilaterally abdomen is soft nontender throat exam unremarkable patient without any nuchal rigidity, chest equal strength throughout and she is mentating appropriately.  Vital signs notable for mild tachycardia with heart rate of 102.  No fever no hypoxia  -Labs ordered, independently viewed and interpreted by me.  Labs remarkable for WBC 3.1, pt does have hx of leukopenia.  Pt is Flu A positive, consistent with her presentation -The patient was maintained on a cardiac monitor.  I personally viewed and interpreted the cardiac monitored which showed an underlying rhythm of: sinus tachycardia -Imaging including -This patient presents to the ED for concern of cold sxs, this involves an extensive number of treatment options, and is a complaint  that carries with it a high risk of complications and morbidity.  The differential diagnosis includes covid, flu, rsv, viral illness, GI illness, meningitis, strep pharyngitis, colitis, c.diff -Co morbidities that complicate the patient evaluation includes ckd, htn, anxiety -Treatment includes toradol, phenergan, reglan -Reevaluation of the patient after these medicines showed that the patient improved -PCP office notes or outside notes reviewed -Discussion with attending Dr. Rhunette Croft -Escalation to admission/observation considered: patients feels much better, is comfortable with discharge, and will follow up with PCP -Prescription medication considered, patient comfortable with OTC medication -Social Determinant of Health considered         Final Clinical Impression(s) / ED Diagnoses Final diagnoses:  Influenza A    Rx / DC Orders ED Discharge Orders     None         Fayrene Helper, PA-C 05/07/23 2346    Derwood Kaplan, MD 05/07/23 2348

## 2023-05-07 NOTE — ED Provider Triage Note (Signed)
 Emergency Medicine Provider Triage Evaluation Note  Kimberly Bowers , a 73 y.o. female  was evaluated in triage.  Pt complains of nausea vomiting diarrhea, URI symptoms, headache.  Patient reports that yesterday morning woke up with sore throat, cough and congestion.  States that this resolved over the course of the day.  Reports he took negative at home COVID and flu test.  Reports that this morning around 1 AM woke up with nausea and vomiting and then began having diarrhea.  Reports symptoms have persisted throughout the day.  Also states she woke up with a headache.  Denies this being a resetting of her life.  Denies fevers, dysuria, flank pain, chest pain, shortness of breath.  States that in the past when she has coming up with this headache, she was given a migraine cocktail which works very well.  She was wondering why I am she feels this way and I question norovirus but she reports that her husband who sleeps in her bed with her every night has had no symptoms.  Review of Systems  Positive:  Negative:   Physical Exam  BP (!) 156/102 (BP Location: Left Arm)   Pulse (!) 108   Temp 98.3 F (36.8 C) (Oral)   Resp 18   Ht 5\' 6"  (1.676 m)   Wt 79.4 kg   LMP 03/11/2000   SpO2 99%   BMI 28.25 kg/m  Gen:   Awake, no distress   Resp:  Normal effort  MSK:   Moves extremities without difficulty  Other:    Medical Decision Making  Medically screening exam initiated at 5:42 PM.  Appropriate orders placed.  Kimberly Bowers was informed that the remainder of the evaluation will be completed by another provider, this initial triage assessment does not replace that evaluation, and the importance of remaining in the ED until their evaluation is complete.     Al Decant, PA-C 05/07/23 1742

## 2023-05-07 NOTE — Discharge Instructions (Addendum)
 You have been diagnosed with influenza A.  Please continue to take Tylenol as needed for headache and bodyaches, you may continue to take your home Zofran or Phenergan for nausea and vomiting.  Stay hydrated drink plenty of fluid and rest.  Return if you have any concern.

## 2023-05-07 NOTE — ED Triage Notes (Addendum)
 Sore throat last night, pt states that has resolved. Now has vomiting and diarrhea that has been ongoing all day every hour. Pt had negative covid and flu test at home. Pt had PO zofran and phenergan suppository at home and had no relief

## 2023-05-08 ENCOUNTER — Other Ambulatory Visit (HOSPITAL_COMMUNITY): Payer: Self-pay

## 2023-05-09 ENCOUNTER — Other Ambulatory Visit: Payer: Self-pay

## 2023-05-09 DIAGNOSIS — E039 Hypothyroidism, unspecified: Secondary | ICD-10-CM

## 2023-05-09 MED ORDER — LEVOTHYROXINE SODIUM 125 MCG PO TABS
ORAL_TABLET | ORAL | 0 refills | Status: AC
Start: 2023-05-09 — End: ?

## 2023-05-29 ENCOUNTER — Ambulatory Visit: Payer: Medicare PPO | Admitting: Nurse Practitioner

## 2023-06-12 ENCOUNTER — Encounter: Payer: Self-pay | Admitting: Gastroenterology

## 2023-06-16 DIAGNOSIS — H5022 Vertical strabismus, left eye: Secondary | ICD-10-CM | POA: Insufficient documentation

## 2023-06-16 DIAGNOSIS — H532 Diplopia: Secondary | ICD-10-CM | POA: Insufficient documentation

## 2023-06-19 ENCOUNTER — Other Ambulatory Visit (HOSPITAL_COMMUNITY): Payer: Self-pay

## 2023-06-20 ENCOUNTER — Other Ambulatory Visit: Payer: Self-pay | Admitting: Thoracic Surgery (Cardiothoracic Vascular Surgery)

## 2023-06-20 DIAGNOSIS — I7121 Aneurysm of the ascending aorta, without rupture: Secondary | ICD-10-CM

## 2023-07-15 ENCOUNTER — Encounter: Payer: Self-pay | Admitting: Thoracic Surgery (Cardiothoracic Vascular Surgery)

## 2023-07-17 ENCOUNTER — Ambulatory Visit
Admission: RE | Admit: 2023-07-17 | Discharge: 2023-07-17 | Disposition: A | Discharge status: Home / Self Care | Source: Ambulatory Visit | Attending: Thoracic Surgery (Cardiothoracic Vascular Surgery) | Admitting: Thoracic Surgery (Cardiothoracic Vascular Surgery)

## 2023-07-17 DIAGNOSIS — I7121 Aneurysm of the ascending aorta, without rupture: Secondary | ICD-10-CM

## 2023-07-25 DIAGNOSIS — N1831 Chronic kidney disease, stage 3a: Secondary | ICD-10-CM | POA: Insufficient documentation

## 2023-07-25 DIAGNOSIS — Z780 Asymptomatic menopausal state: Secondary | ICD-10-CM | POA: Insufficient documentation

## 2023-07-25 DIAGNOSIS — E785 Hyperlipidemia, unspecified: Secondary | ICD-10-CM | POA: Insufficient documentation

## 2023-07-25 DIAGNOSIS — I7121 Aneurysm of the ascending aorta, without rupture: Secondary | ICD-10-CM | POA: Insufficient documentation

## 2023-07-25 DIAGNOSIS — Z8639 Personal history of other endocrine, nutritional and metabolic disease: Secondary | ICD-10-CM | POA: Insufficient documentation

## 2023-07-25 DIAGNOSIS — Z6828 Body mass index (BMI) 28.0-28.9, adult: Secondary | ICD-10-CM | POA: Insufficient documentation

## 2023-07-25 DIAGNOSIS — Z96652 Presence of left artificial knee joint: Secondary | ICD-10-CM | POA: Insufficient documentation

## 2023-08-06 NOTE — Progress Notes (Addendum)
 [address]       [place] [address]             [phone]        Kimberly Bowers [id number] Aug 02, 1950  History of Present Illness: Kimberly Bowers is a 73 year old woman with a past medical history of hypertension, obesity, chronic kidney disease stage III, mixed hyperlipidemia, diverticulitis, hypothyroidism, and an ascending aortic aneurysm. She was first found to have aortic dilatation measuring 3.8cm on echocardiogram during workup for palpitations in 2021. On CTA in May 2023 she was found to have a 3.8cmx4.3cm ascending thoracic aorta. She has been followed by our office since 2022 and ascending aortic aneurysm has remained stable measuring around 4.0cm.   She denies family history of ATAA and personal history of connective tissue disorder.  Today she reports she  had a knee replacement 6 months ago and has done well with that. She is able to ambulate and bike and his hoping to get back to dancing 3 times per week. She reports she is on Wegovy  for weight loss and has been happy with the results. She has a history of thyroid  disease and she is also getting worked up for possible myasthenia gravis and will follow up with neurology. She denies chest pain, chest tightness, shortness of breath, dizziness, LOC and lower extremity edema. She monitors her blood pressure at home and it is usually around 130s/70s, she was recently started on low dose Losartan  by her PCP.   Current Outpatient Medications on File Prior to Visit  Medication Sig Dispense Refill   amoxicillin (AMOXIL) 500 MG capsule SMARTSIG:4 Capsule(s) By Mouth     B Complex Vitamins (B COMPLEX PO) Take 1 tablet by mouth daily.     Cholecalciferol  (VITAMIN D ) 2000 UNITS tablet Take 2,000 Units by mouth daily.     levothyroxine  (SYNTHROID ) 125 MCG tablet TAKE ONE TABLET BY MOUTH ONCE A DAY ON EMPTY STOMACH WITH ONLY WATER  FOR 30 MINUTES. NO ANTACIDS, CALCIUM , MAGNESIUM FOR 4 HOURS AND AVOID BIOTIN 90 tablet 0    losartan  (COZAAR ) 25 MG tablet Take 1 tablet (25 mg total) by mouth daily. 30 tablet 11   MAGNESIUM PO Take 150 mg by mouth daily.     Omega-3 Fatty Acids (OMEGA 3 500 PO) Take 500 mg by mouth daily.     OVER THE COUNTER MEDICATION Take 1 tablet by mouth daily. Intestinal formula #1 supplement     rosuvastatin  (CRESTOR ) 5 MG tablet Take 1 tab daily to reduce risk of heart attack and stroke. 90 tablet 3   Semaglutide -Weight Management (WEGOVY ) 2.4 MG/0.75ML SOAJ Inject 2.4 mg into the skin once a week. 3 mL 2   Wheat Dextrin (BENEFIBER) POWD Take 1 Dose by mouth daily. 1 dose = 1 tablespoon     No current facility-administered medications on file prior to visit.   Vitals: Today's Vitals   08/12/23 0951  BP: (!) 144/96  Pulse: 85  Resp: 18  SpO2: 99%  Weight: 179 lb (81.2 kg)  Height: 5\' 6"  (1.676 m)   Body mass index is 28.89 kg/m.  Physical Exam General: Alert and oriented, no acute distress Neuro: Grossly intact CV: Regular rate and rhythm, no murmur Pulm: Clear to auscultation bilaterally GI: Nontender, no distension Extremities: No edema BLE  CTA Results: CLINICAL DATA:  Aortic aneurysm suspected.   EXAM: CT CHEST WITHOUT CONTRAST   TECHNIQUE: Multidetector CT imaging of the chest was performed following  the standard protocol without IV contrast.   RADIATION DOSE REDUCTION: This exam was performed according to the departmental dose-optimization program which includes automated exposure control, adjustment of the mA and/or kV according to patient size and/or use of iterative reconstruction technique.   COMPARISON:  Chest CT dated 07/29/2022.   FINDINGS: Evaluation of this exam is limited in the absence of intravenous contrast.   Cardiovascular: There is no cardiomegaly or pericardial effusion. Coronary vascular calcification. Stable ascending aorta measuring up to 4 cm. The central pulmonary arteries are grossly unremarkable on this noncontrast CT.    Mediastinum/Nodes: No hilar or mediastinal adenopathy. The esophagus is grossly unremarkable. No mediastinal fluid collection.   Lungs/Pleura: No focal consolidation, pleural effusion or pneumothorax. The central airways are patent.   Upper Abdomen: No acute abnormality.   Musculoskeletal: No acute osseous pathology.   IMPRESSION: 1. No acute intrathoracic pathology. 2. Stable ascending aorta measuring up to 4 cm. Recommend annual imaging follow-up. 3. Coronary vascular calcification.     Electronically Signed   By: Angus Bark M.D.   On: 07/17/2023 12:08  Impression and Plan: ATAA: Kimberly Bowers presents to the clinic with a stable 4.0cm ascending aortic aneurysm. Echocardiogram in 2022 shows a tricuspid aortic valve without evidence of regurgitation.  We discussed the natural history and and risk factors for growth of ascending aortic aneurysms.  We covered the importance of tight blood pressure control, refraining from lifting heavy objects, and avoiding fluoroquinolones.  The patient is aware of signs and symptoms of aortic dissection and when to present to the emergency department. She does not yet meet surgical criteria of 5.5cm. We will continue surveillance, plan to have the patient return to the clinic in 1 year with chest CT noncontrast due to history of elevated creatinine.   HTN: The patient's BP is elevated today in the clinic. She reports the monitors this at home and it is usually well controlled in the 130s/70s. Patient should continue to monitor this at home and notify her PCP if it remains elevated. Continue Losartan  25mg  daily.  Coronary calcification: On rosuvastatin , patient to follow up with her PCP. Continue diet, exercise and weight loss.   Risk Modification:  Statin:  Rosuvastatin   Smoking cessation instruction/counseling given:  never smoker  Patient was counseled on importance of Blood Pressure Control.  Despite Medical intervention if the patient  notices persistently elevated blood pressure readings.  They are instructed to contact their Primary Care Physician  Please avoid use of Fluoroquinolones as this can potentially increase your risk of Aortic Rupture and/or Dissection  Patient educated on signs and symptoms of Aortic Dissection, handout also provided in AVS  Randa Burton, PA-C 08/06/23

## 2023-08-06 NOTE — Patient Instructions (Signed)

## 2023-08-07 ENCOUNTER — Ambulatory Visit

## 2023-08-12 ENCOUNTER — Encounter: Payer: Self-pay | Admitting: Physician Assistant

## 2023-08-12 ENCOUNTER — Ambulatory Visit: Attending: Thoracic Surgery (Cardiothoracic Vascular Surgery) | Admitting: Physician Assistant

## 2023-08-12 VITALS — BP 144/96 | HR 85 | Resp 18 | Ht 66.0 in | Wt 179.0 lb

## 2023-08-12 DIAGNOSIS — I7121 Aneurysm of the ascending aorta, without rupture: Secondary | ICD-10-CM

## 2023-09-09 NOTE — Progress Notes (Signed)
 This video encounter was conducted with the patient's (or proxy's) verbal consent via secure, interactive audio and video telecommunications while away from clinic/office/hospital.  The patient (or proxy) was instructed to have this encounter in a suitably private space and to only have persons present to whom they give permission to participate. In addition, patient identity was confirmed by use of name plus two identifiers.  This visit was coded based on medical decision making (MDM).  Encounter Diagnoses  Name Primary?  . Diplopia Yes  . Hypertropia of left eye   . Mechanical ptosis of right eyelid    A&P Diplopia LHT Mechanical Ptosis of Right Eyelid - We wanted the patient to see neurologist due to variable measurements (re: possible MG) during her last visit 06/2023. Seeing neurology 10/13/2023, currently on wait-list to be able to see them sooner.  - The patient has been on Synthroid  since 73 years of age. Previous TSH 05/2023 elevated, then TSH 07/2023 decreased, then TSH 08/2023 WNL. Started new dose of Synthroid  after 07/2023 bloodwork, now well-controlled. Counseled her that we are not worried about her anti-microsomal thyroid  ab positive result due to her history and now well controlled TSH.  - Discussed MG labs which were negative as well.   - Patient is not currently in prisms. Counseled the patient that she could be a candidate for sticker prisms and can be used as a test drive for her double vision for possible surgery (patient states she is open to surgery now if needed). Will wait until measurements in office are stable.  - States her diplopia is stable.  F/u in 6-8 weeks to schedule in office with Dr. Omega (after 10/13/2023 neurology visit) for alignment check and review neurology notes. Will message Advertising account executive.

## 2023-10-12 NOTE — Progress Notes (Unsigned)
 [facility]  PATIENT: Kimberly Bowers DOB: 02-Oct-1950  REFERRING DOCTOR OR PCP: Leita Coffin, MD SOURCE: Patient, notes from ophthalmology, imaging and lab reports, CT scan personally reviewed  _________________________________   HISTORICAL  CHIEF COMPLAINT:  Chief Complaint  Patient presents with   RM11/Variable Strabismus an Ptosis    Pt is here Alone. Pt states that she has double vision. Pt states that she has ptosis in her right eye. Pt states that her eye has too much movement in it. Pt states that she has been tested for myasthenia gravis. Pt states that she doesn't think she has it,but is needing a 2nd opinion.     HISTORY OF PRESENT ILLNESS:  I had the pleasure of seeing your patient, Kimberly Bowers, at [facility] for neurologic consultation regarding her ptosis and diplopia.  She is a 73 year old woman with insulin -dependent diabetes mellitus who has had ptosis on the right since 2010 or a little earlier.  She felt the onset of the symptoms was gradual.  She has had diplopia since her the 1970's when she was diagnosed with hypothyroidism and placed on Synthroid .   In the past, she noted diplopia would improve when she tilted head towards the right.   More recently, however, her diplopia has changed and tilting is not helping as much.   Diplopia is binocular only.    She notes it while driving so has greatly limited her driving.  She also has noted depth perception is worse the past few years.     She feels the symptoms are fairly constant throughout the day, not worse in the afternoon or if she is sleepy or tired.  She saw Dr. Maurice (neurology) in 2010 for the symptoms..     Lab work for myasthenia gravis was negative.  She had a Tensilon test for myasthenia gravis which was also negative.  She saw Dr. Coffin at [facility] for the diplopia recently.   She was felt not to be a good candidate for prism  glasses due to the variable nature.    She felt she mgiht have MG and tested AChR Ab and MuSK Ab and they were negative.  Her last eye exam was 06/16/2023.   She has had thyroid  issues - when young she may have been hyperthyroid initially vut hypothyroid since.    She never had a thyeoid ablation and has been on Synthroid  50 years with occasionally tweaking of dose.  Earlier this year, antithyroglobulin Thyroid  antibodies were negative and antimicrosomal thyroid  antibodies were positive (can be seen with Hashimoto's thyroiditis or Graves' disease).    She sees endocrinology regularly.  Labs: 06/16/2023: Acetylcholine receptor binding antibody was negative.  Musk autoantibodies was negative.  Antithyroglobulin Thyroid  antibodies were negative.  Antimicrosomal thyroid  antibodies were positive.  Imaging: CT scan of the chest 07/17/2023 did not show any intrathoracic pathology.  No thymus pathology reported.  CT scan of the head 12/29/2016 was personally reviewed and looked normal.  Extraocular muscles appeared normal.  REVIEW OF SYSTEMS: Constitutional: No fevers, chills, sweats, or change in appetite Eyes: No visual changes, double vision, eye pain Ear, nose and throat: No hearing loss, ear pain, nasal congestion, sore throat Cardiovascular: No chest pain, palpitations Respiratory:  No shortness of breath at rest or with exertion.   No wheezes GastrointestinaI: No nausea, vomiting, diarrhea, abdominal pain, fecal incontinence Genitourinary:  No dysuria, urinary retention or frequency.  No nocturia. Musculoskeletal:  No neck pain, back pain Integumentary: No  rash, pruritus, skin lesions Neurological: as above Psychiatric: No depression at this time.  No anxiety Endocrine: No palpitations, diaphoresis, change in appetite, change in weigh or increased thirst Hematologic/Lymphatic:  No anemia, purpura, petechiae. Allergic/Immunologic: No itchy/runny eyes, nasal congestion, recent allergic reactions, rashes  ALLERGIES: Allergies   Allergen Reactions   Quinolones Other (See Comments)    Aortic aneurysm    HOME MEDICATIONS:  Current Outpatient Medications:    B Complex Vitamins (B COMPLEX PO), Take 1 tablet by mouth daily., Disp: , Rfl:    Cholecalciferol  (VITAMIN D ) 2000 UNITS tablet, Take 2,000 Units by mouth daily., Disp: , Rfl:    levothyroxine  (SYNTHROID ) 125 MCG tablet, TAKE ONE TABLET BY MOUTH ONCE A DAY ON EMPTY STOMACH WITH ONLY WATER  FOR 30 MINUTES. NO ANTACIDS, CALCIUM , MAGNESIUM FOR 4 HOURS AND AVOID BIOTIN, Disp: 90 tablet, Rfl: 0   losartan  (COZAAR ) 25 MG tablet, Take 1 tablet (25 mg total) by mouth daily., Disp: 30 tablet, Rfl: 11   MAGNESIUM PO, Take 150 mg by mouth daily., Disp: , Rfl:    Omega-3 Fatty Acids (OMEGA 3 500 PO), Take 500 mg by mouth daily., Disp: , Rfl:    rosuvastatin  (CRESTOR ) 5 MG tablet, Take 1 tab daily to reduce risk of heart attack and stroke., Disp: 90 tablet, Rfl: 3   Wheat Dextrin (BENEFIBER) POWD, Take 1 Dose by mouth daily. 1 dose = 1 tablespoon, Disp: , Rfl:    OVER THE COUNTER MEDICATION, Take 1 tablet by mouth daily. Intestinal formula #1 supplement, Disp: , Rfl:    Semaglutide -Weight Management (WEGOVY ) 2.4 MG/0.75ML SOAJ, Inject 2.4 mg into the skin once a week. (Patient not taking: Reported on 10/13/2023), Disp: 3 mL, Rfl: 2  PAST MEDICAL HISTORY: Past Medical History:  Diagnosis Date   Allergy    Aortic aneurysm, thoracic (HCC)    Mild   Arthritis    Cancer (HCC)    Skin cancer: leg   Cataract    CKD (chronic kidney disease) stage 3, GFR 30-59 ml/min (HCC) 01/11/2020   stage 2: 12/24/22   Diverticulitis 2004   HTN (hypertension) 12/13/2022   B/P severe,    B/,P 160s,  DB/P running >110   Hyperlipidemia    Hypothyroid    Labile hypertension 02/07/2021   Neutropenia (HCC)    PONV (postoperative nausea and vomiting)     PAST SURGICAL HISTORY: Past Surgical History:  Procedure Laterality Date   BRACHIOPLASTY  03/2016   BREAST BIOPSY Left 01/2018    Negative benign   CATARACT EXTRACTION Bilateral 2019   COLONOSCOPY     FOOT SURGERY Right    benign cyst   KNEE ARTHROSCOPY Left    LAPAROSCOPIC GASTRIC BANDING  2004   LAPAROSCOPIC REPAIR AND REMOVAL OF GASTRIC BAND     8/14   TOTAL KNEE ARTHROPLASTY Left 01/06/2023   Procedure: TOTAL KNEE ARTHROPLASTY;  Surgeon: Edna Toribio LABOR, MD;  Location: [facility];  Service: Orthopedics;  Laterality: Left;   TUBAL LIGATION     age 1   UTERINE FIBROID SURGERY  2001    FAMILY HISTORY: Family History  Problem Relation Age of Onset   Cancer Mother        lung   Diabetes Father    Emphysema Father    Melanoma Brother    Colon cancer Neg Hx    Colon polyps Neg Hx    Esophageal cancer Neg Hx    Stomach cancer Neg Hx    Rectal cancer Neg Hx  SOCIAL HISTORY: Social History   Socioeconomic History   Marital status: Married    Spouse name: Not on file   Number of children: Not on file   Years of education: Not on file   Highest education level: Not on file  Occupational History   Not on file  Tobacco Use   Smoking status: Never   Smokeless tobacco: Never  Vaping Use   Vaping status: Never Used  Substance and Sexual Activity   Alcohol  use: Yes    Alcohol /week: 1.0 - 2.0 standard drink of alcohol     Types: 1 - 2 Glasses of wine per week    Comment: soc.   Drug use: No   Sexual activity: Yes    Partners: Male    Birth control/protection: Surgical, Post-menopausal    Comment: BTL  Other Topics Concern   Not on file  Social History Narrative   Not on file   Social Drivers of Health   Financial Resource Strain: Low Risk  (05/30/2023)   Received from [facility]   Overall Financial Resource Strain (CARDIA)    Difficulty of Paying Living Expenses: Not hard at all  Food Insecurity: No Food Insecurity (05/30/2023)   Received from [facility]   Hunger Vital Sign    Within the past 12 months, you worried that your food would run out before you got the money to buy  more.: Never true    Within the past 12 months, the food you bought just didn't last and you didn't have money to get more.: Never true  Transportation Needs: No Transportation Needs (05/30/2023)   Received from [facility] - Transportation    Lack of Transportation (Medical): No    Lack of Transportation (Non-Medical): No  Physical Activity: Not on file  Stress: Not on file  Social Connections: Not on file  Intimate Partner Violence: Not At Risk (01/06/2023)   Humiliation, Afraid, Rape, and Kick questionnaire    Fear of Current or Ex-Partner: No    Emotionally Abused: No    Physically Abused: No    Sexually Abused: No       PHYSICAL EXAM  Vitals:   10/13/23 1051  BP: (!) 142/94  Pulse: 89  SpO2: 99%  Weight: 186 lb (84.4 kg)  Height: 5' 6 (1.676 m)    Body mass index is 30.02 kg/m.   General: The patient is well-developed and well-nourished and in no acute distress  HEENT:  Head is Buckner/AT.  Sclera are anicteric.  Funduscopic exam shows normal optic discs and retinal vessels.  Neck: No carotid bruits are noted.  The neck is nontender.  Cardiovascular: The heart has a regular rate and rhythm with a normal S1 and S2. There were no murmurs, gallops or rubs.    Skin: Extremities are without rash or  edema.  Musculoskeletal:  Back is nontender  Neurologic Exam  Mental status: The patient is alert and oriented x 3 at the time of the examination. The patient has apparent normal recent and remote memory, with an apparently normal attention span and concentration ability.   Speech is normal.  Cranial nerves: There is slight disconjugate gaze looking left.  She did not note improvement upon head tilt to the left or the right..  There is mild right ptosis.  The ptosis and extraocular muscles did not fatigue upon looking up for 90 seconds.  Pupils react to light.  Visual fields are full.  Facial symmetry is present. There is good  facial sensation to soft touch  bilaterally.Facial strength is normal.  Trapezius and sternocleidomastoid strength is normal. No dysarthria is noted.  The tongue is midline, and the patient has symmetric elevation of the soft palate. No obvious hearing deficits are noted.  Motor:  Muscle bulk is normal.   Tone is normal. Strength is  5 / 5 in all 4 extremities.   Sensory: Sensory testing is intact to pinprick, soft touch and vibration sensation in all 4 extremities.  Coordination: Cerebellar testing reveals good finger-nose-finger and heel-to-shin bilaterally.  Gait and station: Station is normal.   Gait is normal. Tandem gait is normal for age. Romberg is negative.   Reflexes: Deep tendon reflexes are symmetric and normal bilaterally.       DIAGNOSTIC DATA (LABS, IMAGING, TESTING) - I reviewed patient records, labs, notes, testing and imaging myself where available.  Lab Results  Component Value Date   WBC 3.1 (L) 05/07/2023   HGB 13.7 05/07/2023   HCT 39.8 05/07/2023   MCV 98.5 05/07/2023   PLT 267 05/07/2023      Component Value Date/Time   NA 134 (L) 05/07/2023 1751   K 3.6 05/07/2023 1751   CL 98 05/07/2023 1751   CO2 24 05/07/2023 1751   GLUCOSE 126 (H) 05/07/2023 1751   BUN 17 05/07/2023 1751   CREATININE 0.74 05/07/2023 1751   CREATININE 0.99 10/21/2022 1222   CALCIUM  9.2 05/07/2023 1751   PROT 7.3 05/07/2023 1751   ALBUMIN 4.2 05/07/2023 1751   AST 25 05/07/2023 1751   ALT 19 05/07/2023 1751   ALKPHOS 60 05/07/2023 1751   BILITOT 0.6 05/07/2023 1751   GFRNONAA >60 05/07/2023 1751   GFRNONAA 63 07/12/2020 1019   GFRAA 74 07/12/2020 1019   Lab Results  Component Value Date   CHOL 170 10/21/2022   HDL 70 10/21/2022   LDLCALC 81 10/21/2022   TRIG 95 10/21/2022   CHOLHDL 2.4 10/21/2022   Lab Results  Component Value Date   HGBA1C 4.8 11/28/2021   Lab Results  Component Value Date   VITAMINB12 524 11/24/2020   Lab Results  Component Value Date   TSH 1.44 12/02/2022        ASSESSMENT AND PLAN  Diplopia - Plan: MR ORBITS W WO CONTRAST  Ptosis of right eyelid - Plan: MR ORBITS W WO CONTRAST  Acquired hypothyroidism   In summary, Kimberly Bowers is a 73 year old woman who has a 50-year history of diplopia and 15-year history of right ptosis.  She has not noted any change in the severity of her diplopia or ptosis.  However, in the past, diplopia would improve with head tilt but no longer does.  The etiology of her symptoms is unclear.  I do not think she has myasthenia gravis.  Blood work was normal.  There is no fatiguing or change with level of alertness/rest.    Because her symptoms have changed over the last 2 years (diplopia no longer improving with head tilt), we will check an MRI of the orbits.  This will allow us  to assess for EOM hypertrophy or other changes.  I did not schedule another appointment but advised her to let me know if her symptoms worsen.  If they become more consistent with myasthenia gravis I would consider referral for single-fiber EMG as serology is only positive about 80% of the time).    Jameria Bradway A. Vear, MD, [facility] 10/13/2023, 3:51 PM Certified in Neurology, Clinical Neurophysiology, Sleep Medicine and Neuroimaging  [facility]  [address] [place], [place] [address] [phone]

## 2023-10-13 ENCOUNTER — Encounter: Payer: Self-pay | Admitting: Neurology

## 2023-10-13 ENCOUNTER — Telehealth: Payer: Self-pay | Admitting: Neurology

## 2023-10-13 ENCOUNTER — Ambulatory Visit: Admitting: Neurology

## 2023-10-13 VITALS — BP 142/94 | HR 89 | Ht 66.0 in | Wt 186.0 lb

## 2023-10-13 DIAGNOSIS — E039 Hypothyroidism, unspecified: Secondary | ICD-10-CM | POA: Diagnosis not present

## 2023-10-13 DIAGNOSIS — H02401 Unspecified ptosis of right eyelid: Secondary | ICD-10-CM

## 2023-10-13 DIAGNOSIS — H532 Diplopia: Secondary | ICD-10-CM

## 2023-10-13 NOTE — Telephone Encounter (Signed)
 Patient said there is a error on summary has on chart has diabetes mellitus and insulin  dependant. Patient stated do not have diabetes or on insulin .

## 2023-10-14 ENCOUNTER — Telehealth: Payer: Self-pay | Admitting: Neurology

## 2023-10-14 NOTE — Telephone Encounter (Signed)
 Kimberly Bowers: [id number] exp. 10/14/23-12/13/23 sent to GI [phone]

## 2023-11-20 ENCOUNTER — Ambulatory Visit
Admission: RE | Admit: 2023-11-20 | Discharge: 2023-11-20 | Disposition: A | Discharge status: Home / Self Care | Source: Ambulatory Visit | Attending: Neurology | Admitting: Neurology

## 2023-11-20 DIAGNOSIS — H532 Diplopia: Secondary | ICD-10-CM

## 2023-11-20 DIAGNOSIS — H02401 Unspecified ptosis of right eyelid: Secondary | ICD-10-CM

## 2023-11-20 MED ORDER — GADOPICLENOL 0.5 MMOL/ML IV SOLN
7.5000 mL | Freq: Once | INTRAVENOUS | Status: AC | PRN
  Administered 2023-11-20: 7.5 mL via INTRAVENOUS

## 2023-11-21 ENCOUNTER — Ambulatory Visit: Payer: Self-pay | Admitting: Neurology

## 2023-12-02 ENCOUNTER — Encounter: Payer: Medicare PPO | Admitting: Nurse Practitioner
